# Patient Record
Sex: Female | Born: 1945
Health system: Southern US, Community
[De-identification: ages and names within clinical notes are randomized; demographics above are authoritative.]

## PROBLEM LIST (undated history)

## (undated) DIAGNOSIS — K219 Gastro-esophageal reflux disease without esophagitis: Secondary | ICD-10-CM

## (undated) DIAGNOSIS — Z8673 Personal history of transient ischemic attack (TIA), and cerebral infarction without residual deficits: Secondary | ICD-10-CM

## (undated) DIAGNOSIS — M8580 Other specified disorders of bone density and structure, unspecified site: Secondary | ICD-10-CM

## (undated) DIAGNOSIS — E782 Mixed hyperlipidemia: Secondary | ICD-10-CM

## (undated) DIAGNOSIS — H8109 Meniere's disease, unspecified ear: Secondary | ICD-10-CM

## (undated) DIAGNOSIS — D509 Iron deficiency anemia, unspecified: Secondary | ICD-10-CM

## (undated) DIAGNOSIS — E039 Hypothyroidism, unspecified: Secondary | ICD-10-CM

## (undated) DIAGNOSIS — J302 Other seasonal allergic rhinitis: Secondary | ICD-10-CM

## (undated) HISTORY — DX: Other seasonal allergic rhinitis: J30.2

## (undated) HISTORY — DX: Meniere's disease, unspecified ear: H81.09

## (undated) HISTORY — PX: OTHER SURGICAL HISTORY: SHX169

## (undated) HISTORY — DX: Hypothyroidism, unspecified: E03.9

## (undated) HISTORY — DX: Other specified disorders of bone density and structure, unspecified site: M85.80

## (undated) HISTORY — DX: Mixed hyperlipidemia: E78.2

## (undated) HISTORY — DX: Personal history of transient ischemic attack (TIA), and cerebral infarction without residual deficits: Z86.73

## (undated) HISTORY — PX: TONSILLECTOMY: SUR1361

## (undated) HISTORY — DX: Iron deficiency anemia, unspecified: D50.9

## (undated) HISTORY — DX: Gastro-esophageal reflux disease without esophagitis: K21.9

---

## 2007-10-29 ENCOUNTER — Ambulatory Visit: Payer: Self-pay | Admitting: Gastroenterology

## 2010-05-30 DIAGNOSIS — H35419 Lattice degeneration of retina, unspecified eye: Secondary | ICD-10-CM | POA: Insufficient documentation

## 2010-05-30 DIAGNOSIS — H33319 Horseshoe tear of retina without detachment, unspecified eye: Secondary | ICD-10-CM | POA: Insufficient documentation

## 2012-04-07 ENCOUNTER — Ambulatory Visit: Payer: Self-pay | Admitting: Family Medicine

## 2012-05-07 ENCOUNTER — Ambulatory Visit: Payer: Self-pay | Admitting: Family Medicine

## 2013-01-26 ENCOUNTER — Ambulatory Visit: Payer: Self-pay | Admitting: Family Medicine

## 2013-02-10 ENCOUNTER — Other Ambulatory Visit: Payer: Self-pay

## 2013-02-24 ENCOUNTER — Ambulatory Visit (INDEPENDENT_AMBULATORY_CARE_PROVIDER_SITE_OTHER): Payer: Medicare Other | Admitting: *Deleted

## 2013-02-24 DIAGNOSIS — G459 Transient cerebral ischemic attack, unspecified: Secondary | ICD-10-CM

## 2013-02-24 DIAGNOSIS — I359 Nonrheumatic aortic valve disorder, unspecified: Secondary | ICD-10-CM

## 2013-03-10 ENCOUNTER — Ambulatory Visit: Payer: Self-pay | Admitting: Family Medicine

## 2013-07-09 DIAGNOSIS — M7520 Bicipital tendinitis, unspecified shoulder: Secondary | ICD-10-CM | POA: Diagnosis not present

## 2013-07-09 DIAGNOSIS — E039 Hypothyroidism, unspecified: Secondary | ICD-10-CM | POA: Diagnosis not present

## 2013-07-09 DIAGNOSIS — M79609 Pain in unspecified limb: Secondary | ICD-10-CM | POA: Diagnosis not present

## 2013-07-20 DIAGNOSIS — H269 Unspecified cataract: Secondary | ICD-10-CM | POA: Insufficient documentation

## 2013-07-20 DIAGNOSIS — H547 Unspecified visual loss: Secondary | ICD-10-CM | POA: Diagnosis not present

## 2013-07-27 DIAGNOSIS — M25519 Pain in unspecified shoulder: Secondary | ICD-10-CM | POA: Diagnosis not present

## 2013-07-27 DIAGNOSIS — E039 Hypothyroidism, unspecified: Secondary | ICD-10-CM | POA: Diagnosis not present

## 2013-07-28 DIAGNOSIS — H8109 Meniere's disease, unspecified ear: Secondary | ICD-10-CM | POA: Diagnosis not present

## 2013-07-28 DIAGNOSIS — H905 Unspecified sensorineural hearing loss: Secondary | ICD-10-CM | POA: Diagnosis not present

## 2013-07-28 DIAGNOSIS — H6120 Impacted cerumen, unspecified ear: Secondary | ICD-10-CM | POA: Diagnosis not present

## 2013-07-28 DIAGNOSIS — H903 Sensorineural hearing loss, bilateral: Secondary | ICD-10-CM | POA: Diagnosis not present

## 2013-07-31 DIAGNOSIS — M25519 Pain in unspecified shoulder: Secondary | ICD-10-CM | POA: Diagnosis not present

## 2013-08-03 DIAGNOSIS — M25519 Pain in unspecified shoulder: Secondary | ICD-10-CM | POA: Diagnosis not present

## 2013-08-05 DIAGNOSIS — M25519 Pain in unspecified shoulder: Secondary | ICD-10-CM | POA: Diagnosis not present

## 2013-08-17 DIAGNOSIS — I6789 Other cerebrovascular disease: Secondary | ICD-10-CM | POA: Diagnosis not present

## 2013-09-04 DIAGNOSIS — M25519 Pain in unspecified shoulder: Secondary | ICD-10-CM | POA: Diagnosis not present

## 2013-09-04 DIAGNOSIS — J309 Allergic rhinitis, unspecified: Secondary | ICD-10-CM | POA: Diagnosis not present

## 2013-09-04 DIAGNOSIS — E039 Hypothyroidism, unspecified: Secondary | ICD-10-CM | POA: Diagnosis not present

## 2013-09-04 DIAGNOSIS — G459 Transient cerebral ischemic attack, unspecified: Secondary | ICD-10-CM | POA: Diagnosis not present

## 2013-09-04 DIAGNOSIS — H8109 Meniere's disease, unspecified ear: Secondary | ICD-10-CM | POA: Diagnosis not present

## 2013-09-07 DIAGNOSIS — M25519 Pain in unspecified shoulder: Secondary | ICD-10-CM | POA: Diagnosis not present

## 2013-09-11 DIAGNOSIS — M25519 Pain in unspecified shoulder: Secondary | ICD-10-CM | POA: Diagnosis not present

## 2013-09-15 DIAGNOSIS — M25519 Pain in unspecified shoulder: Secondary | ICD-10-CM | POA: Diagnosis not present

## 2013-09-21 DIAGNOSIS — M25519 Pain in unspecified shoulder: Secondary | ICD-10-CM | POA: Diagnosis not present

## 2013-09-25 DIAGNOSIS — M25519 Pain in unspecified shoulder: Secondary | ICD-10-CM | POA: Diagnosis not present

## 2014-01-04 DIAGNOSIS — H8109 Meniere's disease, unspecified ear: Secondary | ICD-10-CM | POA: Diagnosis not present

## 2014-01-04 DIAGNOSIS — H9319 Tinnitus, unspecified ear: Secondary | ICD-10-CM | POA: Diagnosis not present

## 2014-01-04 DIAGNOSIS — H903 Sensorineural hearing loss, bilateral: Secondary | ICD-10-CM | POA: Diagnosis not present

## 2014-01-04 DIAGNOSIS — H9120 Sudden idiopathic hearing loss, unspecified ear: Secondary | ICD-10-CM | POA: Diagnosis not present

## 2014-01-04 DIAGNOSIS — H905 Unspecified sensorineural hearing loss: Secondary | ICD-10-CM | POA: Diagnosis not present

## 2014-01-05 DIAGNOSIS — H538 Other visual disturbances: Secondary | ICD-10-CM | POA: Diagnosis not present

## 2014-01-14 DIAGNOSIS — H8109 Meniere's disease, unspecified ear: Secondary | ICD-10-CM | POA: Diagnosis not present

## 2014-01-14 DIAGNOSIS — H903 Sensorineural hearing loss, bilateral: Secondary | ICD-10-CM | POA: Diagnosis not present

## 2014-02-02 DIAGNOSIS — H905 Unspecified sensorineural hearing loss: Secondary | ICD-10-CM | POA: Diagnosis not present

## 2014-02-02 DIAGNOSIS — H903 Sensorineural hearing loss, bilateral: Secondary | ICD-10-CM | POA: Diagnosis not present

## 2014-02-02 DIAGNOSIS — H8109 Meniere's disease, unspecified ear: Secondary | ICD-10-CM | POA: Diagnosis not present

## 2014-02-08 DIAGNOSIS — H8109 Meniere's disease, unspecified ear: Secondary | ICD-10-CM | POA: Diagnosis not present

## 2014-02-15 DIAGNOSIS — H8109 Meniere's disease, unspecified ear: Secondary | ICD-10-CM | POA: Diagnosis not present

## 2014-02-15 DIAGNOSIS — H6060 Unspecified chronic otitis externa, unspecified ear: Secondary | ICD-10-CM | POA: Diagnosis not present

## 2014-02-22 DIAGNOSIS — H8102 Meniere's disease, left ear: Secondary | ICD-10-CM | POA: Diagnosis not present

## 2014-02-22 DIAGNOSIS — H903 Sensorineural hearing loss, bilateral: Secondary | ICD-10-CM | POA: Diagnosis not present

## 2014-03-22 DIAGNOSIS — H8102 Meniere's disease, left ear: Secondary | ICD-10-CM | POA: Diagnosis not present

## 2014-03-22 DIAGNOSIS — H903 Sensorineural hearing loss, bilateral: Secondary | ICD-10-CM | POA: Diagnosis not present

## 2014-03-29 DIAGNOSIS — H9121 Sudden idiopathic hearing loss, right ear: Secondary | ICD-10-CM | POA: Diagnosis not present

## 2014-04-26 DIAGNOSIS — H903 Sensorineural hearing loss, bilateral: Secondary | ICD-10-CM | POA: Diagnosis not present

## 2014-04-26 DIAGNOSIS — H8102 Meniere's disease, left ear: Secondary | ICD-10-CM | POA: Diagnosis not present

## 2014-04-26 DIAGNOSIS — R42 Dizziness and giddiness: Secondary | ICD-10-CM | POA: Diagnosis not present

## 2014-04-26 DIAGNOSIS — H9313 Tinnitus, bilateral: Secondary | ICD-10-CM | POA: Diagnosis not present

## 2014-05-18 DIAGNOSIS — Z9181 History of falling: Secondary | ICD-10-CM | POA: Diagnosis not present

## 2014-05-18 DIAGNOSIS — D473 Essential (hemorrhagic) thrombocythemia: Secondary | ICD-10-CM | POA: Diagnosis not present

## 2014-05-18 DIAGNOSIS — J302 Other seasonal allergic rhinitis: Secondary | ICD-10-CM | POA: Diagnosis not present

## 2014-05-18 DIAGNOSIS — M8580 Other specified disorders of bone density and structure, unspecified site: Secondary | ICD-10-CM | POA: Diagnosis not present

## 2014-05-18 DIAGNOSIS — L821 Other seborrheic keratosis: Secondary | ICD-10-CM | POA: Diagnosis not present

## 2014-05-18 DIAGNOSIS — E039 Hypothyroidism, unspecified: Secondary | ICD-10-CM | POA: Diagnosis not present

## 2014-05-18 DIAGNOSIS — Z1389 Encounter for screening for other disorder: Secondary | ICD-10-CM | POA: Diagnosis not present

## 2014-05-18 DIAGNOSIS — Z8673 Personal history of transient ischemic attack (TIA), and cerebral infarction without residual deficits: Secondary | ICD-10-CM | POA: Diagnosis not present

## 2014-05-26 DIAGNOSIS — Z8673 Personal history of transient ischemic attack (TIA), and cerebral infarction without residual deficits: Secondary | ICD-10-CM | POA: Diagnosis not present

## 2014-05-26 DIAGNOSIS — E039 Hypothyroidism, unspecified: Secondary | ICD-10-CM | POA: Diagnosis not present

## 2014-05-31 DIAGNOSIS — R42 Dizziness and giddiness: Secondary | ICD-10-CM | POA: Diagnosis not present

## 2014-05-31 DIAGNOSIS — H8102 Meniere's disease, left ear: Secondary | ICD-10-CM | POA: Diagnosis not present

## 2014-05-31 DIAGNOSIS — H905 Unspecified sensorineural hearing loss: Secondary | ICD-10-CM | POA: Diagnosis not present

## 2014-05-31 DIAGNOSIS — H9313 Tinnitus, bilateral: Secondary | ICD-10-CM | POA: Diagnosis not present

## 2014-06-10 ENCOUNTER — Ambulatory Visit: Payer: Self-pay | Admitting: Internal Medicine

## 2014-06-10 DIAGNOSIS — K219 Gastro-esophageal reflux disease without esophagitis: Secondary | ICD-10-CM | POA: Diagnosis not present

## 2014-06-10 DIAGNOSIS — E611 Iron deficiency: Secondary | ICD-10-CM | POA: Diagnosis not present

## 2014-06-10 DIAGNOSIS — D473 Essential (hemorrhagic) thrombocythemia: Secondary | ICD-10-CM | POA: Diagnosis not present

## 2014-06-10 DIAGNOSIS — D72829 Elevated white blood cell count, unspecified: Secondary | ICD-10-CM | POA: Diagnosis not present

## 2014-06-10 DIAGNOSIS — Z8673 Personal history of transient ischemic attack (TIA), and cerebral infarction without residual deficits: Secondary | ICD-10-CM | POA: Diagnosis not present

## 2014-06-10 DIAGNOSIS — M8580 Other specified disorders of bone density and structure, unspecified site: Secondary | ICD-10-CM | POA: Diagnosis not present

## 2014-06-10 DIAGNOSIS — E039 Hypothyroidism, unspecified: Secondary | ICD-10-CM | POA: Diagnosis not present

## 2014-06-10 DIAGNOSIS — Z79899 Other long term (current) drug therapy: Secondary | ICD-10-CM | POA: Diagnosis not present

## 2014-06-10 LAB — CBC CANCER CENTER
Comment - H1-Com1: NORMAL
Comment - H1-Com2: NORMAL
HCT: 42.7 % (ref 35.0–47.0)
HGB: 14.4 g/dL (ref 12.0–16.0)
Lymphocytes: 6 %
MCH: 28.5 pg (ref 26.0–34.0)
MCHC: 33.8 g/dL (ref 32.0–36.0)
MCV: 84 fL (ref 80–100)
Monocytes: 1 %
Platelet: 395 x10 3/mm (ref 150–440)
RBC: 5.07 10*6/uL (ref 3.80–5.20)
RDW: 13.2 % (ref 11.5–14.5)
SEGMENTED NEUTROPHILS: 92 %
VARIANT LYMPHOCYTE - H4-RLYMPH: 1 %
WBC: 11 x10 3/mm (ref 3.6–11.0)

## 2014-06-10 LAB — SEDIMENTATION RATE: Erythrocyte Sed Rate: 6 mm/hr (ref 0–30)

## 2014-06-10 LAB — FERRITIN: FERRITIN (ARMC): 37 ng/mL (ref 8–388)

## 2014-06-11 LAB — IRON AND TIBC
IRON SATURATION: 10 %
Iron Bind.Cap.(Total): 388 ug/dL (ref 250–450)
Iron: 37 ug/dL — ABNORMAL LOW (ref 50–170)
UNBOUND IRON-BIND. CAP.: 351 ug/dL

## 2014-06-23 ENCOUNTER — Ambulatory Visit: Payer: Self-pay | Admitting: Family Medicine

## 2014-06-23 DIAGNOSIS — Z1231 Encounter for screening mammogram for malignant neoplasm of breast: Secondary | ICD-10-CM | POA: Diagnosis not present

## 2014-06-24 DIAGNOSIS — K219 Gastro-esophageal reflux disease without esophagitis: Secondary | ICD-10-CM | POA: Diagnosis not present

## 2014-06-24 DIAGNOSIS — D473 Essential (hemorrhagic) thrombocythemia: Secondary | ICD-10-CM | POA: Diagnosis not present

## 2014-06-24 DIAGNOSIS — D72829 Elevated white blood cell count, unspecified: Secondary | ICD-10-CM | POA: Diagnosis not present

## 2014-06-24 DIAGNOSIS — E611 Iron deficiency: Secondary | ICD-10-CM | POA: Diagnosis not present

## 2014-06-24 DIAGNOSIS — E039 Hypothyroidism, unspecified: Secondary | ICD-10-CM | POA: Diagnosis not present

## 2014-06-24 DIAGNOSIS — Z79899 Other long term (current) drug therapy: Secondary | ICD-10-CM | POA: Diagnosis not present

## 2014-06-28 DIAGNOSIS — H8102 Meniere's disease, left ear: Secondary | ICD-10-CM | POA: Diagnosis not present

## 2014-06-28 DIAGNOSIS — H903 Sensorineural hearing loss, bilateral: Secondary | ICD-10-CM | POA: Diagnosis not present

## 2014-06-28 DIAGNOSIS — H9312 Tinnitus, left ear: Secondary | ICD-10-CM | POA: Diagnosis not present

## 2014-06-28 DIAGNOSIS — H6123 Impacted cerumen, bilateral: Secondary | ICD-10-CM | POA: Diagnosis not present

## 2014-07-01 DIAGNOSIS — D509 Iron deficiency anemia, unspecified: Secondary | ICD-10-CM | POA: Diagnosis not present

## 2014-07-06 ENCOUNTER — Ambulatory Visit
Admit: 2014-07-06 | Disposition: A | Discharge status: Home / Self Care | Payer: Self-pay | Attending: Internal Medicine | Admitting: Internal Medicine

## 2014-07-09 ENCOUNTER — Ambulatory Visit: Payer: Self-pay | Admitting: Gastroenterology

## 2014-07-09 DIAGNOSIS — Z881 Allergy status to other antibiotic agents status: Secondary | ICD-10-CM | POA: Diagnosis not present

## 2014-07-09 DIAGNOSIS — Z7982 Long term (current) use of aspirin: Secondary | ICD-10-CM | POA: Diagnosis not present

## 2014-07-09 DIAGNOSIS — K648 Other hemorrhoids: Secondary | ICD-10-CM | POA: Diagnosis not present

## 2014-07-09 DIAGNOSIS — I10 Essential (primary) hypertension: Secondary | ICD-10-CM | POA: Diagnosis not present

## 2014-07-09 DIAGNOSIS — K219 Gastro-esophageal reflux disease without esophagitis: Secondary | ICD-10-CM | POA: Diagnosis not present

## 2014-07-09 DIAGNOSIS — M1610 Unilateral primary osteoarthritis, unspecified hip: Secondary | ICD-10-CM | POA: Diagnosis not present

## 2014-07-09 DIAGNOSIS — K209 Esophagitis, unspecified: Secondary | ICD-10-CM | POA: Diagnosis not present

## 2014-07-09 DIAGNOSIS — M19042 Primary osteoarthritis, left hand: Secondary | ICD-10-CM | POA: Diagnosis not present

## 2014-07-09 DIAGNOSIS — K2970 Gastritis, unspecified, without bleeding: Secondary | ICD-10-CM | POA: Diagnosis not present

## 2014-07-09 DIAGNOSIS — K2950 Unspecified chronic gastritis without bleeding: Secondary | ICD-10-CM | POA: Diagnosis not present

## 2014-07-09 DIAGNOSIS — M19041 Primary osteoarthritis, right hand: Secondary | ICD-10-CM | POA: Diagnosis not present

## 2014-07-09 DIAGNOSIS — Z8673 Personal history of transient ischemic attack (TIA), and cerebral infarction without residual deficits: Secondary | ICD-10-CM | POA: Diagnosis not present

## 2014-07-09 DIAGNOSIS — K5730 Diverticulosis of large intestine without perforation or abscess without bleeding: Secondary | ICD-10-CM | POA: Diagnosis not present

## 2014-07-09 DIAGNOSIS — D123 Benign neoplasm of transverse colon: Secondary | ICD-10-CM | POA: Diagnosis not present

## 2014-07-09 DIAGNOSIS — D509 Iron deficiency anemia, unspecified: Secondary | ICD-10-CM | POA: Diagnosis not present

## 2014-07-14 DIAGNOSIS — L821 Other seborrheic keratosis: Secondary | ICD-10-CM | POA: Diagnosis not present

## 2014-07-23 DIAGNOSIS — R0789 Other chest pain: Secondary | ICD-10-CM | POA: Diagnosis not present

## 2014-07-23 DIAGNOSIS — D509 Iron deficiency anemia, unspecified: Secondary | ICD-10-CM | POA: Diagnosis not present

## 2014-07-23 DIAGNOSIS — Z8673 Personal history of transient ischemic attack (TIA), and cerebral infarction without residual deficits: Secondary | ICD-10-CM | POA: Diagnosis not present

## 2014-07-23 DIAGNOSIS — J302 Other seasonal allergic rhinitis: Secondary | ICD-10-CM | POA: Diagnosis not present

## 2014-07-23 DIAGNOSIS — E782 Mixed hyperlipidemia: Secondary | ICD-10-CM | POA: Diagnosis not present

## 2014-07-23 DIAGNOSIS — Z1389 Encounter for screening for other disorder: Secondary | ICD-10-CM | POA: Diagnosis not present

## 2014-07-23 DIAGNOSIS — R7309 Other abnormal glucose: Secondary | ICD-10-CM | POA: Diagnosis not present

## 2014-07-30 ENCOUNTER — Encounter: Payer: Self-pay | Admitting: Cardiovascular Disease

## 2014-07-30 ENCOUNTER — Ambulatory Visit (INDEPENDENT_AMBULATORY_CARE_PROVIDER_SITE_OTHER): Payer: Medicare Other | Admitting: Cardiovascular Disease

## 2014-07-30 ENCOUNTER — Encounter (INDEPENDENT_AMBULATORY_CARE_PROVIDER_SITE_OTHER): Payer: Self-pay

## 2014-07-30 VITALS — BP 142/88 | HR 91 | Ht 66.0 in | Wt 142.0 lb

## 2014-07-30 DIAGNOSIS — R7989 Other specified abnormal findings of blood chemistry: Secondary | ICD-10-CM | POA: Insufficient documentation

## 2014-07-30 DIAGNOSIS — M8580 Other specified disorders of bone density and structure, unspecified site: Secondary | ICD-10-CM | POA: Insufficient documentation

## 2014-07-30 DIAGNOSIS — H8109 Meniere's disease, unspecified ear: Secondary | ICD-10-CM | POA: Insufficient documentation

## 2014-07-30 DIAGNOSIS — Z8673 Personal history of transient ischemic attack (TIA), and cerebral infarction without residual deficits: Secondary | ICD-10-CM | POA: Diagnosis not present

## 2014-07-30 DIAGNOSIS — E782 Mixed hyperlipidemia: Secondary | ICD-10-CM | POA: Diagnosis not present

## 2014-07-30 DIAGNOSIS — R7309 Other abnormal glucose: Secondary | ICD-10-CM | POA: Insufficient documentation

## 2014-07-30 DIAGNOSIS — I6523 Occlusion and stenosis of bilateral carotid arteries: Secondary | ICD-10-CM | POA: Diagnosis not present

## 2014-07-30 DIAGNOSIS — G4700 Insomnia, unspecified: Secondary | ICD-10-CM | POA: Insufficient documentation

## 2014-07-30 DIAGNOSIS — T148XXA Other injury of unspecified body region, initial encounter: Secondary | ICD-10-CM | POA: Insufficient documentation

## 2014-07-30 DIAGNOSIS — M7520 Bicipital tendinitis, unspecified shoulder: Secondary | ICD-10-CM | POA: Insufficient documentation

## 2014-07-30 DIAGNOSIS — D509 Iron deficiency anemia, unspecified: Secondary | ICD-10-CM | POA: Insufficient documentation

## 2014-07-30 DIAGNOSIS — L821 Other seborrheic keratosis: Secondary | ICD-10-CM | POA: Insufficient documentation

## 2014-07-30 DIAGNOSIS — R0789 Other chest pain: Secondary | ICD-10-CM | POA: Diagnosis not present

## 2014-07-30 DIAGNOSIS — I6529 Occlusion and stenosis of unspecified carotid artery: Secondary | ICD-10-CM | POA: Insufficient documentation

## 2014-07-30 DIAGNOSIS — J302 Other seasonal allergic rhinitis: Secondary | ICD-10-CM | POA: Insufficient documentation

## 2014-07-30 DIAGNOSIS — R350 Frequency of micturition: Secondary | ICD-10-CM | POA: Insufficient documentation

## 2014-07-30 DIAGNOSIS — B353 Tinea pedis: Secondary | ICD-10-CM | POA: Insufficient documentation

## 2014-07-30 DIAGNOSIS — M7070 Other bursitis of hip, unspecified hip: Secondary | ICD-10-CM | POA: Insufficient documentation

## 2014-07-30 DIAGNOSIS — E039 Hypothyroidism, unspecified: Secondary | ICD-10-CM | POA: Insufficient documentation

## 2014-07-30 NOTE — Assessment & Plan Note (Signed)
In 2014, possible TIA with foot drop. No workup done at that time. If she has recurrent symptoms, we have recommended she go to the emergency room for CT scan/MRI No symptoms concerning for arrhythmia such as atrial fibrillation

## 2014-07-30 NOTE — Progress Notes (Signed)
Patient ID: Michelle Fuentes, female    DOB: October 09, 1945, 69 y.o.   MRN: [id number]  HPI Comments: Michelle Fuentes is a 69 year old woman with history of hypertension, Mnire's disease, hyperlipidemia, mild carotid arterial disease on ultrasound in 2014 who presents for evaluation of chest pain.  She reports that approximately one month ago she was driving when she developed chest tightness bilaterally underneath her breasts radiating around her flank. She had just had a ice cream. Symptoms lasted proximally 5 minutes. She also reported having a very small region of discomfort/numbness up in her right jaw/mandible area. She mentioned this to her dental hygienist to recommended EKG. She has this done through primary care. This was essentially normal. She's had no further episodes since that time.  She was found to have low iron on workup with hematology. EGD and colonoscopy showed gastritis, diverticuli, no active bleeding. She is only recently started taking iron supplement.  She denies any other new symptoms. Previously was very active, has not been as active as she has had numerous Dr. Visits.  Carotid ultrasound 01/26/2013 reviewed with her showing atherosclerotic plaque bilaterally, no significant stenosis  She reports total cholesterol 235 with high HDL No prior smoking history or diabetes  She does report episode in 2014 where she had general malaise, went to sleep that night, early in the morning could not get out of bed to go to the bathroom, noticed that she had equally walking, possible foot drop. She got better later on in the day  EKG on today's visit shows normal sinus rhythm with rate 91 bpm, no significant ST or T-wave changes   Allergies  Allergen Reactions  . Erythromycin Base     Other reaction(s): Other (See Comments) Made her tongue sore    Outpatient Encounter Prescriptions as of 07/30/2014  Medication Sig  . aspirin 81 MG tablet Take 81 mg by mouth daily.   .  Cholecalciferol 1000 UNITS capsule Take 1,000 Units by mouth daily.   . diazepam (VALIUM) 2 MG tablet Take 2 mg by mouth every 6 (six) hours as needed.   . fluticasone (FLONASE) 50 MCG/ACT nasal spray Place 2 sprays into the nose daily.   Marland Kitchen levothyroxine (SYNTHROID, LEVOTHROID) 88 MCG tablet Take 88 mcg by mouth daily before breakfast.   . Lutein 6 MG CAPS Take by mouth daily.   . Magnesium Gluconate 250 MG TABS Take 250 mg by mouth daily.   . meclizine (ANTIVERT) 25 MG tablet Take 25 mg by mouth once as needed.   . meloxicam (MOBIC) 7.5 MG tablet Take 7.5 mg by mouth daily.   . prednisoLONE (ORAPRED ODT) 10 MG disintegrating tablet Take 10 mg by mouth every 3 (three) days.   Marland Kitchen triamterene-hydrochlorothiazide (DYAZIDE) 37.5-25 MG per capsule Take 1 capsule by mouth daily.   . [DISCONTINUED] mometasone (NASONEX) 50 MCG/ACT nasal spray Place 2 sprays into the nose daily as needed.     Past Medical History  Diagnosis Date  . History of TIA (transient ischemic attack)   . Seasonal allergies   . Mixed hyperlipidemia   . Iron deficiency anemia   . Meniere disease   . Hypothyroidism   . Osteopenia   . GERD (gastroesophageal reflux disease)     Past Surgical History  Procedure Laterality Date  . Tonsillectomy      Social History  reports that she has never smoked. She does not have any smokeless tobacco history on file. She reports that she does not use illicit  drugs.  Family History family history is not on file.  Review of Systems  Constitutional: Negative.   Respiratory: Positive for chest tightness.   Cardiovascular: Negative.   Gastrointestinal: Negative.   Musculoskeletal: Negative.   Skin: Negative.   Neurological: Negative.   Hematological: Negative.   Psychiatric/Behavioral: Negative.   All other systems reviewed and are negative.   BP 142/88 mmHg  Pulse 91  Ht 5\' 6"  (1.676 m)  Wt 142 lb (64.411 kg)  BMI 22.93 kg/m2  Physical Exam  Constitutional: She is  oriented to person, place, and time. She appears well-developed and well-nourished.  HENT:  Head: Normocephalic.  Nose: Nose normal.  Mouth/Throat: Oropharynx is clear and moist.  Eyes: Conjunctivae are normal. Pupils are equal, round, and reactive to light.  Neck: Normal range of motion. Neck supple. No JVD present.  Cardiovascular: Normal rate, regular rhythm, S1 normal, S2 normal, normal heart sounds and intact distal pulses.  Exam reveals no gallop and no friction rub.   No murmur heard. Nonpitting edema noted around the ankles  Pulmonary/Chest: Effort normal and breath sounds normal. No respiratory distress. She has no wheezes. She has no rales. She exhibits no tenderness.  Abdominal: Soft. Bowel sounds are normal. She exhibits no distension. There is no tenderness.  Musculoskeletal: Normal range of motion. She exhibits edema. She exhibits no tenderness.  Lymphadenopathy:    She has no cervical adenopathy.  Neurological: She is alert and oriented to person, place, and time. Coordination normal.  Skin: Skin is warm and dry. No rash noted. No erythema.  Psychiatric: She has a normal mood and affect. Her behavior is normal. Judgment and thought content normal.    Assessment and Plan  Nursing note and vitals reviewed.

## 2014-07-30 NOTE — Assessment & Plan Note (Signed)
She does not want a statin. Recommended strict diet, weight loss. She reports total cholesterol 235

## 2014-07-30 NOTE — Patient Instructions (Signed)
You are doing well. No medication changes were made.  Please call if you have more chest pain symptoms We could do a stress test (treadmill) or calcium score  Think about a CT coronary calcium score, in [place]  Please call us if you have new issues that need to be addressed before your next appt.

## 2014-07-30 NOTE — Assessment & Plan Note (Signed)
Ultrasounded 2014 showing bilateral carotid atherosclerosis. Likely has underlying coronary disease as well. Discussed cholesterol and various options. She does not want a statin. No carotid bruits on today's visit. Again recommended treadmill or coronary calcium score for any further chest pain. No need at this time for repeat carotid ultrasound

## 2014-07-30 NOTE — Assessment & Plan Note (Signed)
One episode of atypical chest pain one month ago. Normal EKG, normal echocardiogram one year ago. Symptoms presented at rest after eating ice cream. Unable to exclude gallbladder disease. She has been active since that time though has not returned to her full workout schedule. We spent a long time on her visit today talking about various options for evaluation. She does not particularly want a testing involving IVs including Myoview. Options include treadmill or a CT coronary calcium score. She will call us back if she has recurrent symptoms. Recommended she restart her exercise program.

## 2014-08-18 DIAGNOSIS — L570 Actinic keratosis: Secondary | ICD-10-CM | POA: Diagnosis not present

## 2014-08-19 DIAGNOSIS — R0789 Other chest pain: Secondary | ICD-10-CM | POA: Diagnosis not present

## 2014-08-19 DIAGNOSIS — R7309 Other abnormal glucose: Secondary | ICD-10-CM | POA: Diagnosis not present

## 2014-08-23 DIAGNOSIS — H905 Unspecified sensorineural hearing loss: Secondary | ICD-10-CM | POA: Diagnosis not present

## 2014-08-23 DIAGNOSIS — H9312 Tinnitus, left ear: Secondary | ICD-10-CM | POA: Diagnosis not present

## 2014-08-23 DIAGNOSIS — H8103 Meniere's disease, bilateral: Secondary | ICD-10-CM | POA: Diagnosis not present

## 2014-09-16 DIAGNOSIS — E782 Mixed hyperlipidemia: Secondary | ICD-10-CM | POA: Diagnosis not present

## 2014-09-16 DIAGNOSIS — J0101 Acute recurrent maxillary sinusitis: Secondary | ICD-10-CM | POA: Diagnosis not present

## 2014-09-16 DIAGNOSIS — J302 Other seasonal allergic rhinitis: Secondary | ICD-10-CM | POA: Diagnosis not present

## 2014-09-16 DIAGNOSIS — H9311 Tinnitus, right ear: Secondary | ICD-10-CM | POA: Diagnosis not present

## 2014-09-28 DIAGNOSIS — L570 Actinic keratosis: Secondary | ICD-10-CM | POA: Diagnosis not present

## 2014-11-02 DIAGNOSIS — L821 Other seborrheic keratosis: Secondary | ICD-10-CM | POA: Diagnosis not present

## 2014-11-02 DIAGNOSIS — L570 Actinic keratosis: Secondary | ICD-10-CM | POA: Diagnosis not present

## 2014-11-02 DIAGNOSIS — D234 Other benign neoplasm of skin of scalp and neck: Secondary | ICD-10-CM | POA: Diagnosis not present

## 2015-02-08 DIAGNOSIS — L570 Actinic keratosis: Secondary | ICD-10-CM | POA: Diagnosis not present

## 2015-02-08 DIAGNOSIS — L821 Other seborrheic keratosis: Secondary | ICD-10-CM | POA: Diagnosis not present

## 2015-02-08 DIAGNOSIS — Z1283 Encounter for screening for malignant neoplasm of skin: Secondary | ICD-10-CM | POA: Diagnosis not present

## 2015-03-14 DIAGNOSIS — H01021 Squamous blepharitis right upper eyelid: Secondary | ICD-10-CM | POA: Diagnosis not present

## 2015-03-28 DIAGNOSIS — H6123 Impacted cerumen, bilateral: Secondary | ICD-10-CM | POA: Diagnosis not present

## 2015-03-28 DIAGNOSIS — H903 Sensorineural hearing loss, bilateral: Secondary | ICD-10-CM | POA: Diagnosis not present

## 2015-04-22 DIAGNOSIS — H43812 Vitreous degeneration, left eye: Secondary | ICD-10-CM | POA: Diagnosis not present

## 2015-04-28 ENCOUNTER — Ambulatory Visit: Payer: Self-pay | Admitting: Family Medicine

## 2015-04-28 ENCOUNTER — Ambulatory Visit (INDEPENDENT_AMBULATORY_CARE_PROVIDER_SITE_OTHER): Payer: Medicare Other | Admitting: Family Medicine

## 2015-04-28 VITALS — BP 122/82 | HR 80 | Temp 97.7°F | Resp 16 | Ht 66.0 in | Wt 147.0 lb

## 2015-04-28 DIAGNOSIS — J0101 Acute recurrent maxillary sinusitis: Secondary | ICD-10-CM | POA: Diagnosis not present

## 2015-04-28 DIAGNOSIS — D509 Iron deficiency anemia, unspecified: Secondary | ICD-10-CM | POA: Diagnosis not present

## 2015-04-28 DIAGNOSIS — E785 Hyperlipidemia, unspecified: Secondary | ICD-10-CM

## 2015-04-28 DIAGNOSIS — Z23 Encounter for immunization: Secondary | ICD-10-CM | POA: Diagnosis not present

## 2015-04-28 DIAGNOSIS — R202 Paresthesia of skin: Secondary | ICD-10-CM | POA: Diagnosis not present

## 2015-04-28 DIAGNOSIS — Z862 Personal history of diseases of the blood and blood-forming organs and certain disorders involving the immune mechanism: Secondary | ICD-10-CM | POA: Insufficient documentation

## 2015-04-28 MED ORDER — DOXYCYCLINE HYCLATE 100 MG PO TABS: 100.0000 mg | ORAL_TABLET | Freq: Two times a day (BID) | ORAL | Status: DC

## 2015-04-28 MED ORDER — DM-GUAIFENESIN ER 30-600 MG PO TB12: 1.0000 | ORAL_TABLET | Freq: Two times a day (BID) | ORAL | Status: DC

## 2015-04-28 NOTE — Progress Notes (Signed)
Subjective:    Patient ID: Michelle Fuentes, female    DOB: 1945/12/03, 69 y.o.   MRN: [id number]  HPI: Michelle Fuentes is a 69 y.o. female presenting on 04/28/2015 for Facial Pain   HPI  Pt presents for facial pain and pressure. Pressure felt kneading on her forehead off and on on Tuesday. Has been more stressed recently with husbands illness thought it . Experiencing HA- mild- no severe. No visual changes. Facial pressure not is constant. Coughing a little. No sore throat.  Pt is also concerned because she was due to have follow-up on anemia in July per Dr. Ma Hillock.  However lab work was never done. She was mildly anemic prior to colonoscopy and taking oral iron. No cold intolerance, fatigue, weakness, or dizziness.   Pt also having occasional (once every 2 mos) facial tingling. Never occurs in the same spot. R cheek. Chin or nose.  Lasts a few seconds. No facial drop. No loss of sensation. No pain. Pt has not noticed a pattern to symptoms.   Past Medical History  Diagnosis Date  . History of TIA (transient ischemic attack)   . Seasonal allergies   . Mixed hyperlipidemia   . Iron deficiency anemia   . Meniere disease   . Hypothyroidism   . Osteopenia   . GERD (gastroesophageal reflux disease)     Current Outpatient Prescriptions on File Prior to Visit  Medication Sig  . aspirin 81 MG tablet Take 81 mg by mouth daily.   . Cholecalciferol 1000 UNITS capsule Take 1,000 Units by mouth daily.   . diazepam (VALIUM) 2 MG tablet Take 2 mg by mouth every 6 (six) hours as needed.   . fluticasone (FLONASE) 50 MCG/ACT nasal spray Place 2 sprays into the nose daily.   Marland Kitchen levothyroxine (SYNTHROID, LEVOTHROID) 88 MCG tablet Take 88 mcg by mouth daily before breakfast.   . Lutein 6 MG CAPS Take 10 mg by mouth daily.   . Magnesium Gluconate 250 MG TABS Take 250 mg by mouth daily.   . meclizine (ANTIVERT) 25 MG tablet Take 25 mg by mouth once as needed.   . prednisoLONE (ORAPRED ODT) 10 MG  disintegrating tablet Take 10 mg by mouth. Every 5 days and if sx worst every 4 days  . triamterene-hydrochlorothiazide (DYAZIDE) 37.5-25 MG per capsule Take 1 capsule by mouth daily.    No current facility-administered medications on file prior to visit.    Review of Systems  Constitutional: Negative for fever and chills.  HENT: Positive for congestion and sinus pressure. Negative for facial swelling.   Gastrointestinal: Negative for nausea and vomiting.  Musculoskeletal: Negative for neck pain and neck stiffness.  Neurological: Positive for headaches. Negative for dizziness, tremors, weakness and light-headedness.  Psychiatric/Behavioral: Negative.    Per HPI unless specifically indicated above     Objective:    BP 122/82 mmHg  Pulse 80  Temp(Src) 97.7 F (36.5 C) (Oral)  Resp 16  Ht 5\' 6"  (1.676 m)  Wt 147 lb (66.679 kg)  BMI 23.74 kg/m2  SpO2 99%  Wt Readings from Last 3 Encounters:  04/28/15 147 lb (66.679 kg)  07/30/14 142 lb (64.411 kg)    Physical Exam  Constitutional: She is oriented to person, place, and time. She appears well-developed and well-nourished.  HENT:  Head: Normocephalic and atraumatic.  Right Ear: Hearing and tympanic membrane normal.  Left Ear: Hearing and tympanic membrane normal.  Nose: No mucosal edema or rhinorrhea. Right sinus exhibits maxillary  sinus tenderness. Right sinus exhibits no frontal sinus tenderness. Left sinus exhibits no maxillary sinus tenderness and no frontal sinus tenderness.  Mouth/Throat: Uvula is midline, oropharynx is clear and moist and mucous membranes are normal.  Neck: Neck supple.  Cardiovascular: Normal rate, regular rhythm and normal heart sounds.  Exam reveals no gallop and no friction rub.   No murmur heard. Pulmonary/Chest: Effort normal and breath sounds normal. She has no wheezes. She exhibits no tenderness.  Abdominal: Soft. Normal appearance and bowel sounds are normal. She exhibits no distension and no  mass. There is no tenderness. There is no rebound and no guarding.  Musculoskeletal: Normal range of motion. She exhibits no edema or tenderness.  Lymphadenopathy:    She has no cervical adenopathy.  Neurological: She is alert and oriented to person, place, and time. She has normal strength. No cranial nerve deficit or sensory deficit. She displays a negative Romberg sign.  Skin: Skin is warm and dry.   Results for orders placed or performed in visit on 06/10/14  [place]  Result Value Ref Range   WBC 11.0 3.6-11.0 x10 3/mm    RBC 5.07 3.80-5.20 x10 6/mm    HGB 14.4 12.0-16.0 g/dL   HCT 42.7 35.0-47.0 %   MCV 84 80-100 fL   MCH 28.5 26.0-34.0 pg   MCHC 33.8 32.0-36.0 g/dL   RDW 13.2 11.5-14.5 %   Platelet 395 150-440 x10 3/mm    Segmented Neutrophils 92 %   Lymphocytes 6 %   Variant Lymphocyte 1 %   Monocytes 1 %   Comment - H1-Com1 RBCs APPEAR NORMAL    Comment - H1-Com2 NORMAL PLT MORPHOLGY   Sedimentation rate  Result Value Ref Range   Erythrocyte Sed Rate 6 0-30 mm/hr  Ferritin  Result Value Ref Range   Ferritin ([facility]) 37 8-388 ng/mL  Iron and TIBC  Result Value Ref Range   Iron 37 (L) 50-170 mcg/dL   Iron Bind.Cap.(Total) 388 250-450 mcg/dL   Unbound Iron-Bind.Cap. 351 mcg/dL   Iron Saturation 10 %      Assessment & Plan:   Problem List Items Addressed This Visit      Other   Iron deficiency anemia    Recheck anemia panel to determine if iron is WNL. Consider iron therapy if not.       Relevant Orders   Anemia Profile B   Mild hyperlipidemia   Relevant Orders   Lipid Profile    Other Visit Diagnoses    Acute recurrent maxillary sinusitis    -  Primary    Treat for sinus infection given history and facial tenderness. If not improving consider work up for temporal artertis. Supporitve care at home. Alarm symptoms    Relevant Medications    doxycycline (VIBRA-TABS) 100 MG tablet    dextromethorphan-guaiFENesin (MUCINEX DM) 30-600 MG 12hr tablet      Paresthesias        Facial. Likely benign and infrequent. Check B12. No s/s of bells palsy. Reviewed alarm symptoms.     Relevant Orders    Comprehensive Metabolic Panel (CMET)    Anemia Profile B    Need for influenza vaccination        Relevant Orders    Flu vaccine HIGH DOSE PF (Fluzone High dose) (Completed)       Meds ordered this encounter  Medications  . ranitidine (ZANTAC) 150 MG tablet    Sig: Take 150 mg by mouth.  . Ginkgo Biloba Extract 60 MG  CAPS    Sig: Take 120 mg by mouth.  . doxycycline (VIBRA-TABS) 100 MG tablet    Sig: Take 1 tablet (100 mg total) by mouth 2 (two) times daily.    Dispense:  14 tablet    Refill:  0    Order Specific Question:  Supervising Provider    Answer:  Arlis Porta [phone]  . dextromethorphan-guaiFENesin (MUCINEX DM) 30-600 MG 12hr tablet    Sig: Take 1 tablet by mouth 2 (two) times daily.    Dispense:  20 tablet    Refill:  0    Order Specific Question:  Supervising Provider    Answer:  Arlis Porta [id number]      Follow up plan: Return if symptoms worsen or fail to improve.

## 2015-04-28 NOTE — Assessment & Plan Note (Signed)
Recheck anemia panel to determine if iron is WNL. Consider iron therapy if not.

## 2015-04-28 NOTE — Patient Instructions (Addendum)
You can use supportive care at home to help with your symptoms. I have sent Mucinex DM to your pharmacy to help break up the congestion and soothe your cough. You can takes this twice daily.  Honey is a natural cough suppressant- so add it to your tea in the morning.  If you have a humidifer, set that up in your bedroom at night. Use saline rinses to help with sinuses.   Take doxycycline twice daily for 7 days.   Please let me know if the facial pain doesn't subside.

## 2015-04-28 NOTE — Assessment & Plan Note (Signed)
Check lipid panel  

## 2015-05-26 DIAGNOSIS — R202 Paresthesia of skin: Secondary | ICD-10-CM | POA: Diagnosis not present

## 2015-05-26 DIAGNOSIS — D509 Iron deficiency anemia, unspecified: Secondary | ICD-10-CM | POA: Diagnosis not present

## 2015-05-26 DIAGNOSIS — E785 Hyperlipidemia, unspecified: Secondary | ICD-10-CM | POA: Diagnosis not present

## 2015-05-27 LAB — ANEMIA PROFILE B
BASOS ABS: 0 10*3/uL (ref 0.0–0.2)
Basos: 0 %
EOS (ABSOLUTE): 0.3 10*3/uL (ref 0.0–0.4)
Eos: 3 %
FERRITIN: 68 ng/mL (ref 15–150)
Hematocrit: 42.1 % (ref 34.0–46.6)
Hemoglobin: 14.3 g/dL (ref 11.1–15.9)
IMMATURE GRANS (ABS): 0 10*3/uL (ref 0.0–0.1)
Immature Granulocytes: 0 %
Iron Saturation: 37 % (ref 15–55)
Iron: 109 ug/dL (ref 27–139)
LYMPHS: 16 %
Lymphocytes Absolute: 1.4 10*3/uL (ref 0.7–3.1)
MCH: 29.1 pg (ref 26.6–33.0)
MCHC: 34 g/dL (ref 31.5–35.7)
MCV: 86 fL (ref 79–97)
MONOCYTES: 9 %
Monocytes Absolute: 0.8 10*3/uL (ref 0.1–0.9)
NEUTROS ABS: 6.4 10*3/uL (ref 1.4–7.0)
Neutrophils: 72 %
PLATELETS: 408 10*3/uL — AB (ref 150–379)
RBC: 4.92 x10E6/uL (ref 3.77–5.28)
RDW: 13.3 % (ref 12.3–15.4)
RETIC CT PCT: 0.9 % (ref 0.6–2.6)
Total Iron Binding Capacity: 296 ug/dL (ref 250–450)
UIBC: 187 ug/dL (ref 118–369)
VITAMIN B 12: 591 pg/mL (ref 211–946)
WBC: 9 10*3/uL (ref 3.4–10.8)

## 2015-05-27 LAB — COMPREHENSIVE METABOLIC PANEL
A/G RATIO: 2.1 (ref 1.1–2.5)
ALBUMIN: 4.4 g/dL (ref 3.6–4.8)
ALT: 14 IU/L (ref 0–32)
AST: 15 IU/L (ref 0–40)
Alkaline Phosphatase: 60 IU/L (ref 39–117)
BILIRUBIN TOTAL: 0.4 mg/dL (ref 0.0–1.2)
BUN / CREAT RATIO: 25 (ref 11–26)
BUN: 19 mg/dL (ref 8–27)
CHLORIDE: 98 mmol/L (ref 96–106)
CO2: 25 mmol/L (ref 18–29)
Calcium: 9.8 mg/dL (ref 8.7–10.3)
Creatinine, Ser: 0.76 mg/dL (ref 0.57–1.00)
GFR calc non Af Amer: 80 mL/min/{1.73_m2} (ref >59)
GFR, EST AFRICAN AMERICAN: 93 mL/min/{1.73_m2} (ref >59)
GLOBULIN, TOTAL: 2.1 g/dL (ref 1.5–4.5)
Glucose: 89 mg/dL (ref 65–99)
POTASSIUM: 4.5 mmol/L (ref 3.5–5.2)
SODIUM: 141 mmol/L (ref 134–144)
TOTAL PROTEIN: 6.5 g/dL (ref 6.0–8.5)

## 2015-05-27 LAB — LIPID PANEL
CHOL/HDL RATIO: 3 ratio (ref 0.0–4.4)
Cholesterol, Total: 224 mg/dL — ABNORMAL HIGH (ref 100–199)
HDL: 75 mg/dL (ref >39)
LDL Calculated: 129 mg/dL — ABNORMAL HIGH (ref 0–99)
TRIGLYCERIDES: 98 mg/dL (ref 0–149)
VLDL Cholesterol Cal: 20 mg/dL (ref 5–40)

## 2015-06-24 ENCOUNTER — Telehealth: Payer: Self-pay | Admitting: Family Medicine

## 2015-06-24 ENCOUNTER — Other Ambulatory Visit: Payer: Self-pay | Admitting: *Deleted

## 2015-06-24 MED ORDER — LEVOTHYROXINE SODIUM 88 MCG PO TABS: 88.0000 ug | ORAL_TABLET | Freq: Every day | ORAL | Status: DC

## 2015-06-24 NOTE — Telephone Encounter (Signed)
Pt needs a 90 day supply of levothyroxine sent to [facility] in [place].  She would also like to pick up a copy of recent lab results today and the number of office visit in 2016.  Her call back number is [phone]

## 2015-07-10 ENCOUNTER — Other Ambulatory Visit: Payer: Self-pay | Admitting: Family Medicine

## 2015-07-11 ENCOUNTER — Other Ambulatory Visit: Payer: Self-pay | Admitting: Family Medicine

## 2015-07-11 MED ORDER — FLUTICASONE PROPIONATE 50 MCG/ACT NA SUSP: 2.0000 | Freq: Every day | NASAL | Status: DC

## 2015-07-11 NOTE — Telephone Encounter (Signed)
Pt. Called requesting a refill on   Flonase (fluticasore) pt call back # is  [phone]

## 2015-08-18 ENCOUNTER — Encounter: Payer: Self-pay | Admitting: Family Medicine

## 2015-08-18 ENCOUNTER — Ambulatory Visit (INDEPENDENT_AMBULATORY_CARE_PROVIDER_SITE_OTHER): Payer: Medicare Other | Admitting: Family Medicine

## 2015-08-18 VITALS — BP 136/70 | HR 83 | Temp 98.3°F | Resp 16 | Ht 66.0 in | Wt 151.0 lb

## 2015-08-18 DIAGNOSIS — N841 Polyp of cervix uteri: Secondary | ICD-10-CM | POA: Diagnosis not present

## 2015-08-18 DIAGNOSIS — M6530 Trigger finger, unspecified finger: Secondary | ICD-10-CM

## 2015-08-18 DIAGNOSIS — N950 Postmenopausal bleeding: Secondary | ICD-10-CM

## 2015-08-18 NOTE — Progress Notes (Signed)
Subjective:    Patient ID: Michelle Fuentes, female    DOB: [birth year], 70 y.o.   MRN: [id number]  HPI: Michelle Fuentes is a 70 y.o. female presenting on 08/18/2015 for Hand Pain and Vaginal Bleeding   HPI   Bump on palm of hand: Noticed bump on palm of right hand below 4th finger about 2 months ago. Not painful. No skin color change. She has not noticed a change in size. She has not had anything like this before. No decreased ROM. No numbness or tingling. She is retired and does not do any repetitive movements with her right hand. She is right handed.  Vaginal Bleeding: has wiped and had spot of bright red blood occasionally. Felt left sided vibration in vaginal area two weeks ago. Spotting 2 weeks.  Had blood on pad after this day. Has not bled since then. Has history of what she was told is "skin tag" on cervical area, which she was told could start bleeding. Has not bleed before. No abdominal pain. No blood in urine. No pelvic pain.    Past Medical History  Diagnosis Date  . History of TIA (transient ischemic attack)   . Seasonal allergies   . Mixed hyperlipidemia   . Iron deficiency anemia   . Meniere disease   . Hypothyroidism   . Osteopenia   . GERD (gastroesophageal reflux disease)     Current Outpatient Prescriptions on File Prior to Visit  Medication Sig  . aspirin 81 MG tablet Take 81 mg by mouth daily.   . Cholecalciferol 1000 UNITS capsule Take 1,000 Units by mouth daily.   Marland Kitchen dextromethorphan-guaiFENesin (MUCINEX DM) 30-600 MG 12hr tablet Take 1 tablet by mouth 2 (two) times daily.  . diazepam (VALIUM) 2 MG tablet Take 2 mg by mouth every 6 (six) hours as needed.   . doxycycline (VIBRA-TABS) 100 MG tablet Take 1 tablet (100 mg total) by mouth 2 (two) times daily.  . fluticasone (FLONASE) 50 MCG/ACT nasal spray Place 2 sprays into both nostrils daily.  . Ginkgo Biloba Extract 60 MG CAPS Take 120 mg by mouth.  . levothyroxine (SYNTHROID, LEVOTHROID) 88 MCG tablet Take  1 tablet (88 mcg total) by mouth daily before breakfast.  . Lutein 6 MG CAPS Take 10 mg by mouth daily.   . Magnesium Gluconate 250 MG TABS Take 250 mg by mouth daily.   . meclizine (ANTIVERT) 25 MG tablet Take 25 mg by mouth once as needed.   . prednisoLONE (ORAPRED ODT) 10 MG disintegrating tablet Take 10 mg by mouth. Every 5 days and if sx worst every 4 days  . ranitidine (ZANTAC) 150 MG tablet Take 150 mg by mouth.  . triamterene-hydrochlorothiazide (DYAZIDE) 37.5-25 MG per capsule Take 1 capsule by mouth daily.    No current facility-administered medications on file prior to visit.    Review of Systems  Constitutional: Negative for fever, chills and activity change.  HENT: Negative.   Eyes: Negative for visual disturbance.  Respiratory: Negative for cough, chest tightness, shortness of breath and wheezing.   Cardiovascular: Negative for chest pain and leg swelling.  Gastrointestinal: Negative for nausea, vomiting, abdominal pain, diarrhea and constipation.  Endocrine: Negative.  Negative for cold intolerance, heat intolerance, polydipsia, polyphagia and polyuria.  Genitourinary: Positive for vaginal bleeding. Negative for dysuria, hematuria, difficulty urinating and pelvic pain.  Musculoskeletal: Negative.  Negative for myalgias and arthralgias.       Swelling below fourth finger on right hand that started 2  months ago. Non-painful.  Neurological: Negative for dizziness, light-headedness and numbness.  Psychiatric/Behavioral: Negative.  Negative for sleep disturbance and agitation.   Per HPI unless specifically indicated above     Objective:    BP 136/70 mmHg  Pulse 83  Temp(Src) 98.3 F (36.8 C) (Oral)  Resp 16  Ht 5\' 6"  (1.676 m)  Wt 151 lb (68.493 kg)  BMI 24.38 kg/m2  Wt Readings from Last 3 Encounters:  08/18/15 151 lb (68.493 kg)  04/28/15 147 lb (66.679 kg)  07/30/14 142 lb (64.411 kg)    Physical Exam  Constitutional: She is oriented to person, place, and  time. She appears well-developed and well-nourished.  HENT:  Head: Normocephalic and atraumatic.  Neck: Normal range of motion. Neck supple.  Cardiovascular: Normal rate, regular rhythm and normal heart sounds.  Exam reveals no gallop and no friction rub.   No murmur heard. Pulmonary/Chest: Effort normal and breath sounds normal. She has no wheezes. She exhibits no tenderness.  Abdominal: Soft. Normal appearance and bowel sounds are normal. She exhibits no distension and no mass. There is no tenderness. There is no rebound and no guarding.  Genitourinary: There is no rash, tenderness, lesion or injury on the right labia. There is no rash, tenderness, lesion or injury on the left labia. Cervix exhibits no discharge. No tenderness or bleeding in the vagina. No signs of injury around the vagina. No vaginal discharge found.    Musculoskeletal: Normal range of motion. She exhibits no edema or tenderness.       Right hand: She exhibits normal range of motion, no tenderness, no bony tenderness and no swelling. Normal sensation noted. Normal strength noted.       Hands: Lymphadenopathy:    She has no cervical adenopathy.  Neurological: She is alert and oriented to person, place, and time.  Skin: Skin is warm and dry.  Psychiatric: She has a normal mood and affect.   Results for orders placed or performed in visit on 04/28/15  Lipid Profile  Result Value Ref Range   Cholesterol, Total 224 (H) 100 - 199 mg/dL   Triglycerides 98 0 - 149 mg/dL   HDL 75 >39 mg/dL   VLDL Cholesterol Cal 20 5 - 40 mg/dL   LDL Calculated 129 (H) 0 - 99 mg/dL   Chol/HDL Ratio 3.0 0.0 - 4.4 ratio units  Comprehensive Metabolic Panel (CMET)  Result Value Ref Range   Glucose 89 65 - 99 mg/dL   BUN 19 8 - 27 mg/dL   Creatinine, Ser 0.76 0.57 - 1.00 mg/dL   GFR calc non Af Amer 80 >59 mL/min/1.73   GFR calc Af Amer 93 >59 mL/min/1.73   BUN/Creatinine Ratio 25 11 - 26   Sodium 141 134 - 144 mmol/L   Potassium 4.5 3.5  - 5.2 mmol/L   Chloride 98 96 - 106 mmol/L   CO2 25 18 - 29 mmol/L   Calcium 9.8 8.7 - 10.3 mg/dL   Total Protein 6.5 6.0 - 8.5 g/dL   Albumin 4.4 3.6 - 4.8 g/dL   Globulin, Total 2.1 1.5 - 4.5 g/dL   Albumin/Globulin Ratio 2.1 1.1 - 2.5   Bilirubin Total 0.4 0.0 - 1.2 mg/dL   Alkaline Phosphatase 60 39 - 117 IU/L   AST 15 0 - 40 IU/L   ALT 14 0 - 32 IU/L  Anemia Profile B  Result Value Ref Range   Total Iron Binding Capacity 296 250 - 450 ug/dL   UIBC 187  118 - 369 ug/dL   Iron 109 27 - 139 ug/dL   Iron Saturation 37 15 - 55 %   Ferritin 68 15 - 150 ng/mL   Vitamin B-12 591 211 - 946 pg/mL   Folate >20.0 >3.0 ng/mL   WBC 9.0 3.4 - 10.8 x10E3/uL   RBC 4.92 3.77 - 5.28 x10E6/uL   Hemoglobin 14.3 11.1 - 15.9 g/dL   Hematocrit 42.1 34.0 - 46.6 %   MCV 86 79 - 97 fL   MCH 29.1 26.6 - 33.0 pg   MCHC 34.0 31.5 - 35.7 g/dL   RDW 13.3 12.3 - 15.4 %   Platelets 408 (H) 150 - 379 x10E3/uL   Neutrophils 72 %   Lymphs 16 %   Monocytes 9 %   Eos 3 %   Basos 0 %   Neutrophils Absolute 6.4 1.4 - 7.0 x10E3/uL   Lymphocytes Absolute 1.4 0.7 - 3.1 x10E3/uL   Monocytes Absolute 0.8 0.1 - 0.9 x10E3/uL   EOS (ABSOLUTE) 0.3 0.0 - 0.4 x10E3/uL   Basophils Absolute 0.0 0.0 - 0.2 x10E3/uL   Immature Granulocytes 0 %   Immature Grans (Abs) 0.0 0.0 - 0.1 x10E3/uL   Retic Ct Pct 0.9 0.6 - 2.6 %      Assessment & Plan:   Problem List Items Addressed This Visit    None    Visit Diagnoses    Post-menopausal bleeding    -  Primary    Likely 2/2 tissue at cervical os. Refer to GYN for management and evaluation.     Relevant Orders    Ambulatory referral to Obstetrics / Gynecology    Polyp at cervical os        Tissues at cervical os looks most consistent with a polyp- may be the source of vaginal bleeding. Refer to GYN for evaluation for removal.     Relevant Orders    Ambulatory referral to Obstetrics / Gynecology    Trigger finger        Pt has chosen not to treat at this time as it not  painful. We will continue monitor if symptoms progress.        Meds ordered this encounter  Medications  . DISCONTD: triamterene-hydrochlorothiazide (MAXZIDE-25) 37.5-25 MG tablet    Sig: Take 1 tablet by mouth daily.    Refill:  11      Follow up plan: Return if symptoms worsen or fail to improve, for please follow-up with GYN. Marland Kitchen

## 2015-08-18 NOTE — Patient Instructions (Signed)
We are choosing not to treat your trigger finger today. Please let me know if you have symptoms.  We will have GYN evaluate your vaginal bleeding and see if the spot on your cervix needs to be removed. They will call you to schedule an appt.

## 2015-09-06 ENCOUNTER — Encounter: Payer: Self-pay | Admitting: Obstetrics and Gynecology

## 2015-09-06 ENCOUNTER — Ambulatory Visit (INDEPENDENT_AMBULATORY_CARE_PROVIDER_SITE_OTHER): Payer: Medicare Other | Admitting: Obstetrics and Gynecology

## 2015-09-06 VITALS — BP 129/78 | HR 87 | Ht 66.0 in | Wt 151.7 lb

## 2015-09-06 DIAGNOSIS — N841 Polyp of cervix uteri: Secondary | ICD-10-CM

## 2015-09-06 DIAGNOSIS — N950 Postmenopausal bleeding: Secondary | ICD-10-CM | POA: Diagnosis not present

## 2015-09-06 DIAGNOSIS — N952 Postmenopausal atrophic vaginitis: Secondary | ICD-10-CM | POA: Diagnosis not present

## 2015-09-06 NOTE — Progress Notes (Signed)
GYN ENCOUNTER NOTE  Subjective:       Michelle Fuentes is a 70 y.o. G16P2002 female is here for gynecologic evaluation of the following issues:  1. Post menopausal bleeding  70 yo postmenopausal female, not on HRT, presenting today with complaints of 1 month of bleeding.  She describes spotting, at least every other day, ranging from light pink to red tinge on pad.  Three year history of cervical polyp, monitored symptomatically by PCP, who referred her to this office for further management.   Gynecologic History No LMP recorded. Patient is postmenopausal. Contraception: post menopausal status   Obstetric History OB History  Gravida Para Term Preterm AB SAB TAB Ectopic Multiple Living  2 2 2       2     # Outcome Date GA Lbr Len/2nd Weight Sex Delivery Anes PTL Lv  2 Term 1976    F Vag-Spont   Y  1 Term 1975    F Vag-Spont   Y      Past Medical History  Diagnosis Date  . History of TIA (transient ischemic attack)   . Seasonal allergies   . Mixed hyperlipidemia   . Iron deficiency anemia   . Meniere disease   . Hypothyroidism   . Osteopenia   . GERD (gastroesophageal reflux disease)     Past Surgical History  Procedure Laterality Date  . Tonsillectomy    . Mole removed    . Eye surgery      Current Outpatient Prescriptions on File Prior to Visit  Medication Sig Dispense Refill  . aspirin 81 MG tablet Take 81 mg by mouth daily.     . Cholecalciferol 1000 UNITS capsule Take 1,000 Units by mouth daily.     . diazepam (VALIUM) 2 MG tablet Take 2 mg by mouth every 6 (six) hours as needed.     . fluticasone (FLONASE) 50 MCG/ACT nasal spray Place 2 sprays into both nostrils daily. 16 g 3  . Ginkgo Biloba Extract 60 MG CAPS Take 120 mg by mouth.    . levothyroxine (SYNTHROID, LEVOTHROID) 88 MCG tablet Take 1 tablet (88 mcg total) by mouth daily before breakfast. 90 tablet 1  . Lutein 6 MG CAPS Take 10 mg by mouth daily.     . Magnesium Gluconate 250 MG TABS Take 250 mg by  mouth daily.     . meclizine (ANTIVERT) 25 MG tablet Take 25 mg by mouth once as needed.     . prednisoLONE (ORAPRED ODT) 10 MG disintegrating tablet Take 10 mg by mouth. Every 5 days and if sx worst every 4 days    . ranitidine (ZANTAC) 150 MG tablet Take 150 mg by mouth.    . triamterene-hydrochlorothiazide (DYAZIDE) 37.5-25 MG per capsule Take 1 capsule by mouth daily.      No current facility-administered medications on file prior to visit.    Allergies  Allergen Reactions  . Erythromycin Base     Other reaction(s): Other (See Comments) Made her tongue sore    Social History   Social History  . Marital Status: Married    Spouse Name: N/A  . Number of Children: N/A  . Years of Education: N/A   Occupational History  . Not on file.   Social History Main Topics  . Smoking status: Never Smoker   . Smokeless tobacco: Not on file  . Alcohol Use: Yes     Comment: rare  . Drug Use: No  . Sexual Activity: Yes  Birth Control/ Protection: Post-menopausal   Other Topics Concern  . Not on file   Social History Narrative    Family History  Problem Relation Age of Onset  . Hypertension Father   . Melanoma Brother   . Lymphoma Brother   . Breast cancer Maternal Grandmother   . Ovarian cancer Neg Hx   . Colon cancer Neg Hx   . Diabetes Neg Hx   . Heart disease Neg Hx     The following portions of the patient's history were reviewed and updated as appropriate: allergies, current medications, past family history, past medical history, past social history, past surgical history and problem list.  Review of Systems Review of Systems - Negative except those listed in HPI. Review of Systems - General ROS: negative for - chills, fatigue, fever, hot flashes, malaise or night sweats Hematological and Lymphatic ROS: negative for - bleeding problems or swollen lymph nodes Gastrointestinal ROS: negative for - abdominal pain, blood in stools, change in bowel habits and  nausea/vomiting Musculoskeletal ROS: negative for - joint pain, muscle pain or muscular weakness Genito-Urinary ROS: negative for - change in menstrual cycle, dysmenorrhea, dyspareunia, dysuria, genital discharge, genital ulcers, hematuria, incontinence, irregular/heavy menses, nocturia or pelvic painjj  Objective:   BP 129/78 mmHg  Pulse 87  Ht 5\' 6"  (1.676 m)  Wt 151 lb 11 oz (68.806 kg)  BMI 24.50 kg/m2 CONSTITUTIONAL: Well-developed, well-nourished female in no acute distress.  SKIN: Skin is warm and dry. No rash noted. Not diaphoretic. No erythema. No pallor. [place]: Alert and oriented to person, place, and time.  PSYCHIATRIC: Normal mood and affect. Normal behavior. Normal judgment and thought content. PELVIC:  External Genitalia: atrophic vulva  BUS: Normal  Vagina: moderate atrophy  Cervix: parous, 1cm fleshy, erythemas polyp at OS  Uterus: Not examined  Adnexa: Not examined  RV: Not examined   Bladder: Not examined MUSCULOSKELETAL: Normal range of motion. No tenderness.  No cyanosis, clubbing, or edema.  PROCEDURE : ENDOCERVICAL POLYP REMOVAL Used ring forceps to grasp polyp and twisting to remove.  Monsel's applied for hemostasis.  Minimal blood loss.    Assessment:   1. Postmenopausal bleeding 2. Endocervical polyp 3. Vaginal atrophy    Plan:   1. Endocervical polyp removed in office. 2. No intercourse for 3 days. 3. Schedule pelvic ultrasound in next week to assess postmenopausal bleeding. 4. Return 1 week after ultrasound for possible endometrial biopsy and will discuss polyp pathology results at that time. 5.  Will call to cancel [name] follow-up if no pertinent findings and review pathology results at that time as necessary.  Michelle Doe Penninger, PA-S Brayton Mars, MD   I have seen, interviewed, and examined the patient in conjunction with the [facility].A. student and affirm the diagnosis and management plan. Martin A. DeFrancesco, MD,  FACOG   Note: This dictation was prepared with Dragon dictation along with smaller phrase technology. Any transcriptional errors that result from this process are unintentional.

## 2015-09-06 NOTE — Patient Instructions (Addendum)
1. Endocervical polyp is removed. 2. No Intercourse for 3 days. 3. Ultrasound is scheduled to assess postmenopausal bleeding 4. Return in 1 week after ultrasound for possible endometrial biopsy 5. Tylenol 2 extra strength every 6 hours as needed

## 2015-09-07 ENCOUNTER — Ambulatory Visit (INDEPENDENT_AMBULATORY_CARE_PROVIDER_SITE_OTHER): Payer: Medicare Other

## 2015-09-07 DIAGNOSIS — N841 Polyp of cervix uteri: Secondary | ICD-10-CM | POA: Diagnosis not present

## 2015-09-07 DIAGNOSIS — N950 Postmenopausal bleeding: Secondary | ICD-10-CM

## 2015-09-07 DIAGNOSIS — N952 Postmenopausal atrophic vaginitis: Secondary | ICD-10-CM | POA: Insufficient documentation

## 2015-09-09 LAB — PATHOLOGY

## 2015-09-19 ENCOUNTER — Telehealth: Payer: Self-pay | Admitting: Family Medicine

## 2015-09-19 ENCOUNTER — Telehealth: Payer: Self-pay | Admitting: Obstetrics and Gynecology

## 2015-09-19 DIAGNOSIS — Z1239 Encounter for other screening for malignant neoplasm of breast: Secondary | ICD-10-CM

## 2015-09-19 DIAGNOSIS — Z13820 Encounter for screening for osteoporosis: Secondary | ICD-10-CM

## 2015-09-19 NOTE — Telephone Encounter (Signed)
Michelle Fuentes called saying she's not received her results from her pathology report and her [facility]. She's wondering if she can get that information so she'll know if she needs to keep her appt on Thursday. Please contact her regarding this.  Pt's ph# [phone] Thank you.

## 2015-09-19 NOTE — Telephone Encounter (Signed)
Pt. Called requesting a bone density,mammogram.

## 2015-09-20 ENCOUNTER — Other Ambulatory Visit: Payer: Self-pay | Admitting: *Deleted

## 2015-09-20 DIAGNOSIS — H903 Sensorineural hearing loss, bilateral: Secondary | ICD-10-CM | POA: Diagnosis not present

## 2015-09-20 DIAGNOSIS — Z13820 Encounter for screening for osteoporosis: Secondary | ICD-10-CM

## 2015-09-20 DIAGNOSIS — Z1239 Encounter for other screening for malignant neoplasm of breast: Secondary | ICD-10-CM

## 2015-09-20 NOTE — Telephone Encounter (Signed)
Orders were placed. Please schedule DXA. Thanks! AK

## 2015-09-20 NOTE — Telephone Encounter (Signed)
Pt aware per [facility]- u/s only showed a fibroid. emb- endocervical polp. R/s her appt 6/8 at 7:45. Will call back once MAD reviews labs.

## 2015-09-20 NOTE — Telephone Encounter (Signed)
Called patient and gave contact information for [facility]. [phone]

## 2015-09-22 ENCOUNTER — Ambulatory Visit: Payer: Medicare Other | Admitting: Obstetrics and Gynecology

## 2015-10-11 ENCOUNTER — Telehealth: Payer: Self-pay | Admitting: *Deleted

## 2015-10-11 NOTE — Telephone Encounter (Signed)
Patient called and stated that Dr. Tennis Must wanted her to come in for U/S f/u . At her last visit she said that if the results was good she didn't have to come in . Patient is wondering do she still need her appt on 10/13/15?  Pt is requesting a call back. Call back number is [phone]. Patient states you can leave a message on her answering machine .

## 2015-10-12 NOTE — Telephone Encounter (Signed)
Pt aware per vm. RF to cx appt.

## 2015-10-13 ENCOUNTER — Ambulatory Visit: Payer: Medicare Other | Admitting: Obstetrics and Gynecology

## 2015-11-01 DIAGNOSIS — H8109 Meniere's disease, unspecified ear: Secondary | ICD-10-CM | POA: Diagnosis not present

## 2015-11-01 DIAGNOSIS — H903 Sensorineural hearing loss, bilateral: Secondary | ICD-10-CM | POA: Diagnosis not present

## 2015-12-16 ENCOUNTER — Telehealth: Payer: Self-pay | Admitting: Family Medicine

## 2015-12-16 MED ORDER — FLUTICASONE PROPIONATE 50 MCG/ACT NA SUSP: 2.0000 | Freq: Every day | NASAL | 3 refills | Status: DC

## 2015-12-16 MED ORDER — LEVOTHYROXINE SODIUM 88 MCG PO TABS: 88.0000 ug | ORAL_TABLET | Freq: Every day | ORAL | 3 refills | Status: DC

## 2015-12-16 NOTE — Telephone Encounter (Signed)
Pt needs a new prescription for flonase and a 3 month refill on levothyroxine sent to [facility] in [place].  Her call back numbers are [phone] and [phone]

## 2015-12-16 NOTE — Telephone Encounter (Signed)
Medications sent to pharmacy on file  ?

## 2016-01-05 ENCOUNTER — Telehealth: Payer: Self-pay | Admitting: Family Medicine

## 2016-01-05 NOTE — Telephone Encounter (Signed)
I am routing this is Caryl Pina for further investigation. Thanks! AK

## 2016-01-05 NOTE — Telephone Encounter (Signed)
Pt. Called states that' she was told that the code that was on her clm ?(chart)  her insurance may not pay for her  Mammogram.  Ptwanted to  Know if the  Code could be changed. Pt call back # is  [phone], Cell [phone]

## 2016-02-06 ENCOUNTER — Other Ambulatory Visit: Payer: Self-pay | Admitting: Family Medicine

## 2016-02-06 MED ORDER — FLUTICASONE PROPIONATE 50 MCG/ACT NA SUSP: 2.0000 | Freq: Every day | NASAL | 3 refills | Status: DC

## 2016-02-20 ENCOUNTER — Ambulatory Visit: Payer: Medicare Other | Admitting: Family Medicine

## 2016-02-20 ENCOUNTER — Ambulatory Visit (INDEPENDENT_AMBULATORY_CARE_PROVIDER_SITE_OTHER): Payer: Medicare Other | Admitting: Family Medicine

## 2016-02-20 ENCOUNTER — Encounter: Payer: Self-pay | Admitting: Family Medicine

## 2016-02-20 VITALS — BP 137/70 | HR 91 | Temp 97.9°F | Resp 16 | Ht 66.0 in | Wt 159.0 lb

## 2016-02-20 DIAGNOSIS — J301 Allergic rhinitis due to pollen: Secondary | ICD-10-CM

## 2016-02-20 DIAGNOSIS — H00014 Hordeolum externum left upper eyelid: Secondary | ICD-10-CM | POA: Diagnosis not present

## 2016-02-20 DIAGNOSIS — Z1231 Encounter for screening mammogram for malignant neoplasm of breast: Secondary | ICD-10-CM

## 2016-02-20 DIAGNOSIS — M8589 Other specified disorders of bone density and structure, multiple sites: Secondary | ICD-10-CM | POA: Diagnosis not present

## 2016-02-20 DIAGNOSIS — Z1239 Encounter for other screening for malignant neoplasm of breast: Secondary | ICD-10-CM

## 2016-02-20 DIAGNOSIS — J0100 Acute maxillary sinusitis, unspecified: Secondary | ICD-10-CM | POA: Diagnosis not present

## 2016-02-20 MED ORDER — DM-GUAIFENESIN ER 30-600 MG PO TB12: 1.0000 | ORAL_TABLET | Freq: Two times a day (BID) | ORAL | 0 refills | Status: DC

## 2016-02-20 MED ORDER — FLUTICASONE PROPIONATE 50 MCG/ACT NA SUSP: 2.0000 | Freq: Every day | NASAL | 11 refills | Status: DC

## 2016-02-20 MED ORDER — DOXYCYCLINE HYCLATE 100 MG PO TABS: 100.0000 mg | ORAL_TABLET | Freq: Two times a day (BID) | ORAL | 0 refills | Status: DC

## 2016-02-20 NOTE — Patient Instructions (Addendum)
You can use supportive care at home to help with your symptoms. I have sent Mucinex DM to your pharmacy to help break up the congestion and soothe your cough. You can takes this twice daily.  Nunzio Cory is a natural cough suppressant- so add it to your tea in the morning.  If you have a humidifer, set that up in your bedroom at night.   Take doxycycline twice daily for sinusitis for 7 days.    Stye A stye is a bump on your eyelid caused by a bacterial infection. A stye can form inside the eyelid (internal stye) or outside the eyelid (external stye). An internal stye may be caused by an infected oil-producing gland inside your eyelid. An external stye may be caused by an infection at the base of your eyelash (hair follicle). Styes are very common. Anyone can get them at any age. They usually occur in just one eye, but you may have more than one in either eye.  CAUSES  The infection is almost always caused by bacteria called Staphylococcus aureus. This is a common type of bacteria that lives on your skin. RISK FACTORS You may be at higher risk for a stye if you have had one before. You may also be at higher risk if you have:  Diabetes.  Long-term illness.  Long-term eye redness.  A skin condition called seborrhea.  High fat levels in your blood (lipids). SIGNS AND SYMPTOMS  Eyelid pain is the most common symptom of a stye. Internal styes are more painful than external styes. Other signs and symptoms may include:  Painful swelling of your eyelid.  A scratchy feeling in your eye.  Tearing and redness of your eye.  Pus draining from the stye. DIAGNOSIS  Your health care provider may be able to diagnose a stye just by examining your eye. The health care provider may also check to make sure:  You do not have a fever or other signs of a more serious infection.  The infection has not spread to other parts of your eye or areas around your eye. TREATMENT  Most styes will clear up in a few  days without treatment. In some cases, you may need to use antibiotic drops or ointment to prevent infection. Your health care provider may have to drain the stye surgically if your stye is:  Large.  Causing a lot of pain.  Interfering with your vision. This can be done using a thin blade or a needle.  HOME CARE INSTRUCTIONS   Take medicines only as directed by your health care provider.  Apply a clean, warm compress to your eye for 10 minutes, 4 times a day.  Do not wear contact lenses or eye makeup until your stye has healed.  Do not try to pop or drain the stye. SEEK MEDICAL CARE IF:  You have chills or a fever.  Your stye does not go away after several days.  Your stye affects your vision.  Your eyeball becomes swollen, red, or painful. MAKE SURE YOU:  Understand these instructions.  Will watch your condition.  Will get help right away if you are not doing well or get worse.   This information is not intended to replace advice given to you by your health care provider. Make sure you discuss any questions you have with your health care provider.   Document Released: 01/31/2005 Document Revised: 05/14/2014 Document Reviewed: 08/07/2013 Elsevier Interactive Patient Education [employer].

## 2016-02-20 NOTE — Assessment & Plan Note (Signed)
Order repeat DXA scan since it has been 3 years.

## 2016-02-20 NOTE — Progress Notes (Signed)
Subjective:    Patient ID: Michelle Fuentes, female    DOB: October 23, 1945, 70 y.o.   MRN: [id number]  HPI: Michelle Fuentes is a 70 y.o. female presenting on 02/20/2016 for Sinusitis (facial  pain more on Left side and around eyes may be a stye on Left side)   HPI  Pt presents for L sided facial pain and swelling. Having congestion and facial swelling. L ear pain. Using flonase and claritin with little relief. Green nasal drainage.  Discomfort to touch on her face when putting on make-up. Has 2 spots and on L temple- that are not sore to touch. No pus or drainge. She does have a knot on her L upper eye.Sore and tender. H/o blehpharitis. Pain and swelling in the eye lid.  No shortness of breath. Non-productive cough. No chest tightness.  Pt needs new orders for DXA scan and mammogram.   Past Medical History:  Diagnosis Date  . GERD (gastroesophageal reflux disease)   . History of TIA (transient ischemic attack)   . Hypothyroidism   . Iron deficiency anemia   . Meniere disease   . Mixed hyperlipidemia   . Osteopenia   . Seasonal allergies     Current Outpatient Prescriptions on File Prior to Visit  Medication Sig  . aspirin 81 MG tablet Take 81 mg by mouth daily.   . Cholecalciferol 1000 UNITS capsule Take 1,000 Units by mouth daily.   . diazepam (VALIUM) 2 MG tablet Take 2 mg by mouth every 6 (six) hours as needed.   . fluticasone (FLONASE) 50 MCG/ACT nasal spray Place 2 sprays into both nostrils daily.  . Ginkgo Biloba Extract 60 MG CAPS Take 120 mg by mouth.  . levothyroxine (SYNTHROID, LEVOTHROID) 88 MCG tablet Take 1 tablet (88 mcg total) by mouth daily before breakfast.  . Lutein 6 MG CAPS Take 10 mg by mouth daily.   . Magnesium Gluconate 250 MG TABS Take 250 mg by mouth daily.   . meclizine (ANTIVERT) 25 MG tablet Take 25 mg by mouth once as needed.   . prednisoLONE (ORAPRED ODT) 10 MG disintegrating tablet Take 10 mg by mouth. Every 5 days and if sx worst every 4 days  .  ranitidine (ZANTAC) 150 MG tablet Take 150 mg by mouth.  . triamterene-hydrochlorothiazide (DYAZIDE) 37.5-25 MG per capsule Take 1 capsule by mouth daily.    No current facility-administered medications on file prior to visit.     Review of Systems  Constitutional: Negative for chills and fever.  HENT: Positive for congestion, facial swelling and sinus pressure. Negative for ear pain, sneezing and sore throat.   Eyes: Positive for redness (L eyelid).  Respiratory: Negative for cough, chest tightness and wheezing.   Cardiovascular: Negative for chest pain and palpitations.  Gastrointestinal: Negative.  Negative for diarrhea, nausea and vomiting.  Musculoskeletal: Negative for neck pain and neck stiffness.  Neurological: Positive for headaches.   Per HPI unless specifically indicated above     Objective:    BP 137/70   Pulse 91   Temp 97.9 F (36.6 C) (Oral)   Resp 16   Ht 5\' 6"  (1.676 m)   Wt 159 lb (72.1 kg)   BMI 25.66 kg/m   Wt Readings from Last 3 Encounters:  02/20/16 159 lb (72.1 kg)  09/06/15 151 lb 11 oz (68.8 kg)  08/18/15 151 lb (68.5 kg)    Physical Exam  Constitutional: She appears well-developed and well-nourished. No distress.  HENT:  Head:  Normocephalic and atraumatic.  Right Ear: Hearing and tympanic membrane normal. Tympanic membrane is not erythematous and not bulging.  Left Ear: Hearing and tympanic membrane normal. Tympanic membrane is not erythematous and not bulging.  Nose: Mucosal edema and rhinorrhea present. No sinus tenderness or nasal septal hematoma. Right sinus exhibits no maxillary sinus tenderness and no frontal sinus tenderness. Left sinus exhibits maxillary sinus tenderness and frontal sinus tenderness.  Mouth/Throat: Uvula is midline and mucous membranes are normal. No uvula swelling. No posterior oropharyngeal edema or posterior oropharyngeal erythema.  Eyes: Conjunctivae and EOM are normal. Pupils are equal, round, and reactive to light.  Left eye exhibits hordeolum. Left eye exhibits no chemosis, no discharge and no exudate. Left conjunctiva is not injected. Left conjunctiva has no hemorrhage.  Neck: Neck supple. No Brudzinski's sign and no Kernig's sign noted.  Cardiovascular: Normal rate, regular rhythm and normal heart sounds.   Pulmonary/Chest: Breath sounds normal. No accessory muscle usage. No tachypnea. No respiratory distress.  Lymphadenopathy:    She has no cervical adenopathy.   Results for orders placed or performed in visit on 09/06/15  Pathology  Result Value Ref Range   PATH REPORT.SITE OF ORIGIN SPEC Comment    . Comment    PATH REPORT.RELEVANT HX SPEC Comment    PATH REPORT.FINAL DX SPEC Comment    SIGNED OUT BY: Comment    GROSS DESCRIPTION: Comment    . Comment    PAYMENT PROCEDURE Comment       Assessment & Plan:   Problem List Items Addressed This Visit    None    Visit Diagnoses   None.     No orders of the defined types were placed in this encounter.     Follow up plan: No Follow-up on file.

## 2016-02-22 ENCOUNTER — Ambulatory Visit
Admission: RE | Admit: 2016-02-22 | Discharge: 2016-02-22 | Disposition: A | Discharge status: Home / Self Care | Payer: Medicare Other | Source: Ambulatory Visit | Attending: Family Medicine | Admitting: Family Medicine

## 2016-02-22 ENCOUNTER — Telehealth: Payer: Self-pay | Admitting: Family Medicine

## 2016-02-22 DIAGNOSIS — Z1231 Encounter for screening mammogram for malignant neoplasm of breast: Secondary | ICD-10-CM | POA: Diagnosis not present

## 2016-02-22 DIAGNOSIS — M8589 Other specified disorders of bone density and structure, multiple sites: Secondary | ICD-10-CM | POA: Insufficient documentation

## 2016-02-22 DIAGNOSIS — Z1239 Encounter for other screening for malignant neoplasm of breast: Secondary | ICD-10-CM

## 2016-02-22 DIAGNOSIS — M8588 Other specified disorders of bone density and structure, other site: Secondary | ICD-10-CM | POA: Diagnosis not present

## 2016-02-22 NOTE — Telephone Encounter (Signed)
Pt's eyelid is a little more swollen and red and more small bumps have popped up above eyelid and on forehead.  Amy said she could call in an ointment for that.  Her call back number is [phone]

## 2016-02-23 ENCOUNTER — Encounter: Payer: Self-pay | Admitting: Family Medicine

## 2016-02-23 ENCOUNTER — Ambulatory Visit (INDEPENDENT_AMBULATORY_CARE_PROVIDER_SITE_OTHER): Payer: Medicare Other | Admitting: Family Medicine

## 2016-02-23 VITALS — BP 145/83 | HR 87 | Temp 98.6°F | Resp 16 | Ht 65.0 in | Wt 160.0 lb

## 2016-02-23 DIAGNOSIS — B0222 Postherpetic trigeminal neuralgia: Secondary | ICD-10-CM | POA: Insufficient documentation

## 2016-02-23 DIAGNOSIS — B0231 Zoster conjunctivitis: Secondary | ICD-10-CM

## 2016-02-23 DIAGNOSIS — H01004 Unspecified blepharitis left upper eyelid: Secondary | ICD-10-CM | POA: Diagnosis not present

## 2016-02-23 DIAGNOSIS — B028 Zoster with other complications: Secondary | ICD-10-CM | POA: Diagnosis not present

## 2016-02-23 MED ORDER — VALACYCLOVIR HCL 1 G PO TABS: 1000.0000 mg | ORAL_TABLET | Freq: Three times a day (TID) | ORAL | 0 refills | Status: DC

## 2016-02-23 MED ORDER — ERYTHROMYCIN 5 MG/GM OP OINT: 1.0000 "application " | TOPICAL_OINTMENT | Freq: Three times a day (TID) | OPHTHALMIC | 0 refills | Status: DC

## 2016-02-23 NOTE — Patient Instructions (Addendum)
Thank you for coming in to clinic today.  1. Take Valacyclovir viral medicine 3 times a day for 7 days - If pain develops worsening or burning, let us know and we can start gabapentin for nerve pain  Use erythryomycin ointment in eye up to 3 times a day for 1 week  May continue warm compresses  [facility]   [address], [place], [place] [address] Phone: [phone]  Wyonia Hough, MD Loura Back. Dingeldein, MD   Please schedule a follow-up appointment with Dr. Parks Ranger in 2 weeks if Left eye rash not improved  If you have any other questions or concerns, please feel free to call the clinic or send a message through [place]. You may also schedule an earlier appointment if necessary.  Nobie Putnam, DO [place]

## 2016-02-23 NOTE — Progress Notes (Signed)
Subjective:    Patient ID: Michelle Fuentes, female    DOB: Feb 05, 1946, 70 y.o.   MRN: [id number]  Michelle Fuentes is a 70 y.o. female presenting on 02/23/2016 for Belepharitis (Left eye swollen onset for 6 days rsised patches and tender when pressed )  Patient presents for a same day appointment.  HPI  LEFT EYE / FACE RASH and BELPHARITIS UPPER LID - Reports symptoms started 5 days ago with some itching and red raised rash on her left face, also with a "stinging" sensation in her left upper eyelid, later with eyelid swelling and redness. Onset while she was out on a trip to mountains and sight seeing near [facility], she was not in heavily forested area and did not do hiking, admits to lots of "bugs flying around" but no obvious tick or distinct bug bite that she remembers. - She was seen in office here at [facility] on 02/20/16 3 days ago for same complaint but rash was significantly more mild at that time, she only had 1-2 red irritated spots on left upper face and her Left eyelid with swelling and possible stye. She was started on Doxycycline for possible sinus infection as well and to cover for bacterial involvement of her skin. - Today she has worsening rash with some symptoms of itching, occasional burning, and a few sharp twinges of pain in scalp on left side very brief. Still using warm compresses to clear eye discharge and drainage. - Taking Flonase, Claritin, has not started mucinex yet, states sinus symptoms not worsening - S/p Zostavax 3 years ago, has never had Shingles before. - Denies any fevers/chills, rash elsewhere on body, headache, vision loss or blurred vision, numbness, tingling, weakness   Social History  Substance Use Topics  . Smoking status: Never Smoker  . Smokeless tobacco: Never Used  . Alcohol use Yes     Comment: rare    Review of Systems Per HPI unless specifically indicated above     Objective:    BP (!) 145/83 (BP Location: Right Arm, Patient  Position: Sitting, Cuff Size: Normal)   Pulse 87   Temp 98.6 F (37 C) (Oral)   Resp 16   Ht 5\' 5"  (1.651 m)   Wt 160 lb (72.6 kg)   BMI 26.63 kg/m   Wt Readings from Last 3 Encounters:  02/23/16 160 lb (72.6 kg)  02/20/16 159 lb (72.1 kg)  09/06/15 151 lb 11 oz (68.8 kg)    Physical Exam  Constitutional: She appears well-developed and well-nourished. No distress.  Well-appearing, comfortable, cooperative  HENT:  Frontal Left sinus minimal tender. Bilateral maxillary sinuses non-tender. Nares patent without purulence or edema. Bilateral TMs clear without erythema, effusion or bulging. Oropharynx clear without erythema, exudates, edema or asymmetry.  Eyes: EOM are normal. Pupils are equal, round, and reactive to light. Right eye exhibits no discharge. Left eye exhibits no discharge.  Left eye mild conjunctival injection. Left upper eyelid soft mild edema with erythema, central point on external upper eyelid possible resolved ext hordeolum without palpable mass today. Evidence of upper blepharitis with some flaking skin and crusty discharge resolved.  Skin: Skin is warm and dry. Rash (Scattered erythematous vesicular rash appears in trigeminal dermatomal pattern on Left upper lateral face involvement near eyelid, with erythematous upper L eyelid see other exam) noted. She is not diaphoretic.  Nursing note and vitals reviewed.          Assessment & Plan:   Problem List Items  Addressed This Visit    Herpes zoster conjunctivitis of left eye    Suspected HZV, with Left facial involvement, see A&P above - Left eye involvement with conjunctivitis, unclear if this is blepharitis vs HZV - Urgent work in at Ophthalmology, contacted [facility] (spoke with triage nurse for Dr Sandra Cockayne) and arranged urgent follow-up work in appointment today with Dr Sandra Cockayne, patient advised to go directly to their office now, before 10:00am and they will see her. I deferred any further eye testing  today and did not perform fluorescin dye since she will be at ophtho soon      Relevant Medications   valACYclovir (VALTREX) 1000 MG tablet   Acute trigeminal herpes zoster - Primary    Suspected HZV with concern with characteristic erythematous vesicular rash across skin of left face lateral eye and left upper eyelid, seems to be in trigeminal dermatomal pattern, gradual worsening over >4 days, associated with Left upper eyelid involvement likely blepharitis (history of recurrent problem) - S/p Zostavax 3 years ago, no prior shingles - No evidence of eye involvement on direct ophthalmoscope today. No visual loss.  Plan: 1. Start Valacyclovir 1g TID for 7 days anti viral treatment 2. [facility] (spoke with triage nurse for Dr Sandra Cockayne) and arranged urgent follow-up work in appointment today with Dr Sandra Cockayne, patient advised to go directly to their office now, before 10:00am and they will see her. I deferred any further eye testing today and did not perform fluorescin dye since she will be at ophtho soon 3. Follow-up within next few weeks pending progress, given minimal pain now, perhaps her immunization with Zostavax will limit post-herpetic neuralgia problems, if needed consider gabapentin or other neuropathic analgesia      Relevant Medications   valACYclovir (VALTREX) 1000 MG tablet    Other Visit Diagnoses    Blepharitis of left upper eyelid, unspecified type       Add erythromycin opht ointment TID for 1 week for additional coverage, she is to follow-up with Ophtho first, may make change to treatment.   Relevant Medications   erythromycin ophthalmic ointment      Meds ordered this encounter  Medications  . valACYclovir (VALTREX) 1000 MG tablet    Sig: Take 1 tablet (1,000 mg total) by mouth 3 (three) times daily. For 7 days    Dispense:  21 tablet    Refill:  0  . erythromycin ophthalmic ointment    Sig: Place 1 application into the left eye 3 (three)  times daily. For 1 week    Dispense:  3.5 g    Refill:  0    Follow up plan: Return in about 2 weeks (around 03/08/2016), or if symptoms worsen or fail to improve, for left eye shingles, blepharitis.  Nobie Putnam, [facility] 02/23/2016, 12:41 PM

## 2016-02-23 NOTE — Assessment & Plan Note (Signed)
Suspected HZV, with Left facial involvement, see A&P above - Left eye involvement with conjunctivitis, unclear if this is blepharitis vs HZV - Urgent work in at Ophthalmology, contacted [facility] (spoke with triage nurse for Dr Sandra Cockayne) and arranged urgent follow-up work in appointment today with Dr Sandra Cockayne, patient advised to go directly to their office now, before 10:00am and they will see her. I deferred any further eye testing today and did not perform fluorescin dye since she will be at ophtho soon

## 2016-02-23 NOTE — Assessment & Plan Note (Addendum)
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 No visual loss.  Plan: 1. Start Valacyclovir 1g TID for 7 days anti viral treatment 2. [place] (spoke with triage nurse for Dr Sandra Cockayne) and arranged urgent follow-up work in appointment today with Dr Sandra Cockayne, patient advised to go directly to their office now, before 10:00am and they will see her. I deferred any further eye testing today and did not perform fluorescin dye since she will be at ophtho soon 3. Follow-up within next few weeks pending progress, given minimal pain now, perhaps her immunization with Zostavax will limit post-herpetic neuralgia problems, if needed consider gabapentin or other neuropathic analgesia

## 2016-02-23 NOTE — Telephone Encounter (Signed)
Pt is seeing Dr. Raliegh Ip today.

## 2016-02-24 ENCOUNTER — Telehealth: Payer: Self-pay | Admitting: Family Medicine

## 2016-02-24 DIAGNOSIS — B0222 Postherpetic trigeminal neuralgia: Secondary | ICD-10-CM

## 2016-02-24 MED ORDER — HYDROXYZINE HCL 10 MG PO TABS: 5.0000 mg | ORAL_TABLET | Freq: Two times a day (BID) | ORAL | 0 refills | Status: DC | PRN

## 2016-02-24 MED ORDER — GABAPENTIN 100 MG PO CAPS: ORAL_CAPSULE | ORAL | 1 refills | Status: DC

## 2016-02-24 NOTE — Telephone Encounter (Signed)
Last seen 02/23/16 for left eye rash and concern for shingles. Started patient on Valacyclovir. Sent immediately to Ophthalmology, awaiting records from Ophtho. Advised patient may need gabapentin if worsening pain.  Sent in Gabapentin 100mg  capsules titration rx and Hydroxyzine low dose PRN itching.  Follow-up as planned 1-2 weeks for suspected shingles.  Nobie Putnam, [facility] 02/24/2016, 10:04 AM

## 2016-02-24 NOTE — Telephone Encounter (Signed)
Pt. Called requesting gabapentin, pt  Also requested something for Itching [facility]

## 2016-03-01 DIAGNOSIS — B028 Zoster with other complications: Secondary | ICD-10-CM | POA: Diagnosis not present

## 2016-03-09 DIAGNOSIS — B028 Zoster with other complications: Secondary | ICD-10-CM | POA: Diagnosis not present

## 2016-03-21 ENCOUNTER — Ambulatory Visit (INDEPENDENT_AMBULATORY_CARE_PROVIDER_SITE_OTHER): Payer: Medicare Other | Admitting: Family Medicine

## 2016-03-21 ENCOUNTER — Encounter: Payer: Self-pay | Admitting: Family Medicine

## 2016-03-21 VITALS — BP 148/68 | HR 90 | Temp 97.9°F | Resp 16 | Ht 65.0 in | Wt 160.0 lb

## 2016-03-21 DIAGNOSIS — B0222 Postherpetic trigeminal neuralgia: Secondary | ICD-10-CM | POA: Diagnosis not present

## 2016-03-21 DIAGNOSIS — B0231 Zoster conjunctivitis: Secondary | ICD-10-CM

## 2016-03-21 MED ORDER — HYDROXYZINE HCL 10 MG PO TABS: 5.0000 mg | ORAL_TABLET | Freq: Two times a day (BID) | ORAL | 2 refills | Status: DC | PRN

## 2016-03-21 MED ORDER — TRIAMCINOLONE ACETONIDE 0.5 % EX CREA: 1.0000 "application " | TOPICAL_CREAM | Freq: Two times a day (BID) | CUTANEOUS | 1 refills | Status: DC

## 2016-03-21 NOTE — Progress Notes (Signed)
Subjective:    Patient ID: Michelle Fuentes, female    DOB: [birth year], 70 y.o.   MRN: [id number]  Michelle Fuentes is a 70 y.o. female presenting on 03/21/2016 for Herpes Zoster (follow up)  HPI  FOLLOW-UP SHINGLES, Left Face / Eye - Last visit 10/19 saw me at [facility], initial diagnosis of herpes zoster shingles of left upper face / scalp, trigeminal region, initially with early rash symptoms within days of onset on presentation, treated with Valacyclovir 1g TID x 7 days, given concern of potential opthalmic complication with possible L eye conjunctivitis, urgently referred to Ophtho, established with [place] presents in follow-up about 1 month later. Significant improvement with healing of facial rash lesions, initial severe shooting pains from scalp, ear, to face have resolved. Now only experiences occasional tingling, crawling and pins sensation in distribution of rash. Still has small residual rash irritation on left upper eyelid - Not taking gabapentin regularly, admits she was confused by instructions, but takes 1-2 at night, some sedation, also taking hydroxyzine occasionally for itching - Followed by Dr Sandra Cockayne [facility]), initial visit without eye or retinal involvement, then she reports follow-up visit had some eye involvement, treated with steroid eye drops and antibiotic drops. Next follow-up in a week. - Denies any headache, new rash, extending numbness, tingling, hearing loss, vision loss  Social History  Substance Use Topics  . Smoking status: Never Smoker  . Smokeless tobacco: Never Used  . Alcohol use Yes     Comment: rare    Review of Systems Per HPI unless specifically indicated above     Objective:    BP (!) 148/68   Pulse 90   Temp 97.9 F (36.6 C) (Oral)   Resp 16   Ht 5\' 5"  (1.651 m)   Wt 160 lb (72.6 kg)   BMI 26.63 kg/m   Wt Readings from Last 3 Encounters:  03/21/16 160 lb (72.6 kg)  02/23/16 160 lb (72.6 kg)    02/20/16 159 lb (72.1 kg)    Physical Exam  Constitutional: She appears well-developed and well-nourished. No distress.  Well-appearing, comfortable, cooperative  HENT:  Head: Normocephalic and atraumatic.  Mouth/Throat: Oropharynx is clear and moist.  Bilateral TMs clear without erythema, effusion or bulging. No blisters or ear canal involvement.  Eyes: Conjunctivae and EOM are normal. Pupils are equal, round, and reactive to light. Right eye exhibits no discharge. Left eye exhibits no discharge.  No evidence of conjunctivitis. No peri-orbital or eyelid edema.  Neurological: She is alert.  Skin: Skin is warm and dry. Rash (Significantly resolved left facial rash. Now only mild residual erythematous upper L eyelid ) noted. She is not diaphoretic.  Nursing note and vitals reviewed.     Assessment & Plan:   Problem List Items Addressed This Visit    Herpes zoster conjunctivitis of left eye    Followed by Ophthalmology, Dr Sandra Cockayne [facility]) [place] on steroid eye drops      Relevant Medications   hydrOXYzine (ATARAX/VISTARIL) 10 MG tablet   Acute trigeminal herpes zoster - Primary    Resolving left facial trigeminal VZV shingles outbreak, now with mild remaining healing rash and mild residual post-herpetic symptoms, currently 1 month since onset. - Complication with Left eye VZV conjunctivitis, treated by Ophthalmology on steroid eye drops   Plan: 1. Reassurance, acute VZV episode seems to be mostly resolved, and suspect gradual improvement of current post-herpetic symptoms as continues to heal, unsure duration can  have some chronic residual symptoms, however should be less severe since s/p receiving Zostavax 3 years ago 2. Re-discussed gabapentin titration instructions, may need for weeks to months to control symptoms 3. Refill Hydroxyzine for itching as continues to heal, use PRN, also given rx Triamcinolone 0.5% cream up to 1-2 weeks then PRN, avoid contact with  eyelids 4. Continue follow-up with Ophtho 5. Follow-up as needed      Relevant Medications   hydrOXYzine (ATARAX/VISTARIL) 10 MG tablet   triamcinolone cream (KENALOG) 0.5 %      Meds ordered this encounter  Medications  . hydrOXYzine (ATARAX/VISTARIL) 10 MG tablet    Sig: Take 0.5-1 tablets (5-10 mg total) by mouth 2 (two) times daily as needed for itching. Caution with sedation if taking with gabapentin.    Dispense:  30 tablet    Refill:  2  . triamcinolone cream (KENALOG) 0.5 %    Sig: Apply 1 application topically 2 (two) times daily. To affected areas, for up to 2 weeks.    Dispense:  30 g    Refill:  1    Follow up plan: Return in about 3 months (around 06/21/2016).  Nobie Putnam, [facility] 03/21/2016, 10:21 PM

## 2016-03-21 NOTE — Assessment & Plan Note (Signed)
Followed by Ophthalmology, Dr Sandra Cockayne [facility]) Continues on steroid eye drops

## 2016-03-21 NOTE — Assessment & Plan Note (Signed)
Resolving left facial trigeminal VZV shingles outbreak, now with mild remaining healing rash and mild residual post-herpetic symptoms, currently 1 month since onset. - Complication with Left eye VZV conjunctivitis, treated by Ophthalmology on steroid eye drops   Plan: 1. Reassurance, acute VZV episode seems to be mostly resolved, and suspect gradual improvement of current post-herpetic symptoms as continues to heal, unsure duration can have some chronic residual symptoms, however should be less severe since s/p receiving Zostavax 3 years ago 2. Re-discussed gabapentin titration instructions, may need for weeks to months to control symptoms 3. Refill Hydroxyzine for itching as continues to heal, use PRN, also given rx Triamcinolone 0.5% cream up to 1-2 weeks then PRN, avoid contact with eyelids 4. Continue follow-up with Ophtho 5. Follow-up as needed

## 2016-03-21 NOTE — Patient Instructions (Signed)
Thank you for coming in to clinic today.  1. I am reassured that your Shingles outbreak is healing well. As discussed the residual symptoms of tingling and nerve pain can last for weeks to months, sometime they can come back years later. - Itching is most likely with healing tissue and rash, but it can be from nerve injury as well, this should improve over time as well  Take hydroxyzine as discussed.  Start Gabapentin 100mg  capsules now at 2 times a day for a few days, and see how tolerated if it makes you too sleepy or sedated, if not then you may increase to 3 times a day - You may ultimately take it 1 capsule (100mg ) 3 times a day, OR 3 capsules at night, which ever helps the most  Triamcinolone cream use on affected areas of face for itching up to 2 weeks then stop, may re use if needed  Please request records from [facility] to be sent to [name].  Please schedule a follow-up appointment with Dr. Parks Ranger as needed within 3 months for follow-up  If you have any other questions or concerns, please feel free to call the clinic or send a message through [place]. You may also schedule an earlier appointment if necessary.  Nobie Putnam, DO [place]

## 2016-03-27 DIAGNOSIS — B028 Zoster with other complications: Secondary | ICD-10-CM | POA: Diagnosis not present

## 2016-04-13 DIAGNOSIS — B028 Zoster with other complications: Secondary | ICD-10-CM | POA: Diagnosis not present

## 2016-05-16 DIAGNOSIS — B028 Zoster with other complications: Secondary | ICD-10-CM | POA: Diagnosis not present

## 2016-05-21 DIAGNOSIS — H903 Sensorineural hearing loss, bilateral: Secondary | ICD-10-CM | POA: Diagnosis not present

## 2016-06-19 DIAGNOSIS — Z9889 Other specified postprocedural states: Secondary | ICD-10-CM | POA: Diagnosis not present

## 2016-06-29 DIAGNOSIS — B028 Zoster with other complications: Secondary | ICD-10-CM | POA: Diagnosis not present

## 2016-07-31 DIAGNOSIS — B028 Zoster with other complications: Secondary | ICD-10-CM | POA: Diagnosis not present

## 2016-09-26 ENCOUNTER — Telehealth: Payer: Self-pay | Admitting: Family Medicine

## 2016-09-26 DIAGNOSIS — J301 Allergic rhinitis due to pollen: Secondary | ICD-10-CM

## 2016-09-26 MED ORDER — FLUTICASONE PROPIONATE 50 MCG/ACT NA SUSP: 2.0000 | Freq: Every day | NASAL | 11 refills | Status: DC

## 2016-09-26 NOTE — Telephone Encounter (Signed)
Refilled [place], DO [facility] 09/26/2016, 6:15 PM

## 2016-09-26 NOTE — Telephone Encounter (Signed)
Pt needs a [facility] [place]  Her call back number is [phone]

## 2016-11-21 DIAGNOSIS — H8102 Meniere's disease, left ear: Secondary | ICD-10-CM | POA: Diagnosis not present

## 2016-11-21 DIAGNOSIS — H903 Sensorineural hearing loss, bilateral: Secondary | ICD-10-CM | POA: Diagnosis not present

## 2017-01-02 ENCOUNTER — Other Ambulatory Visit: Payer: Self-pay | Admitting: Family Medicine

## 2017-01-02 DIAGNOSIS — E039 Hypothyroidism, unspecified: Secondary | ICD-10-CM

## 2017-01-02 MED ORDER — LEVOTHYROXINE SODIUM 88 MCG PO TABS: 88.0000 ug | ORAL_TABLET | Freq: Every day | ORAL | 0 refills | Status: DC

## 2017-01-02 NOTE — Telephone Encounter (Signed)
Refill sent Levothyroxine 53mcg daily given 90 pills for a 3 month supply with 0 refills.  She has not had TSH lab from our office in past >1 year. She would be due for all routine blood work including Thyroid test.  Recommend that she schedule an office visit to follow-up / she can schedule for morning visit and present fasting, and we can draw labs same day, or can she schedule routine visit and we can order blood work for future.  Nobie Putnam, [facility] 01/02/2017, 5:15 PM

## 2017-01-02 NOTE — Telephone Encounter (Signed)
Pt. Called  Requesting a refill on  Levothyroxine 88 mcg. Pt. Call back  # is  [phone]

## 2017-01-28 DIAGNOSIS — B028 Zoster with other complications: Secondary | ICD-10-CM | POA: Diagnosis not present

## 2017-02-13 DIAGNOSIS — H9312 Tinnitus, left ear: Secondary | ICD-10-CM | POA: Diagnosis not present

## 2017-02-13 DIAGNOSIS — H903 Sensorineural hearing loss, bilateral: Secondary | ICD-10-CM | POA: Diagnosis not present

## 2017-03-19 ENCOUNTER — Other Ambulatory Visit: Payer: Self-pay

## 2017-03-19 ENCOUNTER — Other Ambulatory Visit: Payer: Self-pay | Admitting: Nurse Practitioner

## 2017-03-19 ENCOUNTER — Ambulatory Visit (INDEPENDENT_AMBULATORY_CARE_PROVIDER_SITE_OTHER): Payer: Medicare HMO | Admitting: Nurse Practitioner

## 2017-03-19 ENCOUNTER — Telehealth: Payer: Self-pay | Admitting: Nurse Practitioner

## 2017-03-19 ENCOUNTER — Encounter: Payer: Self-pay | Admitting: Nurse Practitioner

## 2017-03-19 VITALS — BP 143/63 | HR 93 | Temp 98.3°F | Resp 18 | Ht 65.0 in | Wt 159.4 lb

## 2017-03-19 DIAGNOSIS — R7309 Other abnormal glucose: Secondary | ICD-10-CM

## 2017-03-19 DIAGNOSIS — B9689 Other specified bacterial agents as the cause of diseases classified elsewhere: Secondary | ICD-10-CM

## 2017-03-19 DIAGNOSIS — E785 Hyperlipidemia, unspecified: Secondary | ICD-10-CM

## 2017-03-19 DIAGNOSIS — E039 Hypothyroidism, unspecified: Secondary | ICD-10-CM

## 2017-03-19 DIAGNOSIS — Z862 Personal history of diseases of the blood and blood-forming organs and certain disorders involving the immune mechanism: Secondary | ICD-10-CM

## 2017-03-19 DIAGNOSIS — J019 Acute sinusitis, unspecified: Secondary | ICD-10-CM | POA: Diagnosis not present

## 2017-03-19 DIAGNOSIS — I6523 Occlusion and stenosis of bilateral carotid arteries: Secondary | ICD-10-CM

## 2017-03-19 MED ORDER — BENZONATATE 100 MG PO CAPS: 100.0000 mg | ORAL_CAPSULE | Freq: Two times a day (BID) | ORAL | 0 refills | Status: DC | PRN

## 2017-03-19 MED ORDER — DOXYCYCLINE HYCLATE 100 MG PO TABS: 100.0000 mg | ORAL_TABLET | Freq: Two times a day (BID) | ORAL | 0 refills | Status: DC

## 2017-03-19 NOTE — Progress Notes (Signed)
Labs only for prior to physical.

## 2017-03-19 NOTE — Progress Notes (Signed)
Subjective:    Patient ID: Michelle Fuentes, female    DOB: 03-27-1946, 71 y.o.   MRN: [id number]  Michelle Fuentes is a 71 y.o. female presenting on 03/19/2017 for Cough (presistant cough, post nasal drainage x 6 days )   HPI URI symptoms Wed pm Thurs am woke up w/ cough.  Cough and headaches began 6-7 days ago. Each day is progressively worsening.   - Last night couldn't rest r/t sensation of choking w/ post nasal drainage and cough - Drinking warm liquid helps soothe throat - Has been taking fluticasone 2 sprays once daily, once daily antihistamine loratadine for allergic rhinitis and currently.  Notes some improvement after taking both, but does not have all day relief. Headaches in early am and return as day progresses.  Headaches have been generally worse every day.   - Pt feels some subjective fever and occasional chills.   - denies n/v constipation and diarrhea.   - Denies changes in hearing or ear pain.  Has felt cracking noise in r jaw, but is not new to URI symptoms.  Social History   Tobacco Use  . Smoking status: Never Smoker  . Smokeless tobacco: Never Used  Substance Use Topics  . Alcohol use: Yes    Comment: rare  . Drug use: No    Review of Systems Per HPI unless specifically indicated above     Objective:    BP (!) 143/63 (BP Location: Right Arm, Patient Position: Sitting, Cuff Size: Normal)   Pulse 93   Temp 98.3 F (36.8 C) (Oral)   Resp 18   Ht 5\' 5"  (1.651 m)   Wt 159 lb 6.4 oz (72.3 kg)   SpO2 100%   BMI 26.53 kg/m   Wt Readings from Last 3 Encounters:  03/19/17 159 lb 6.4 oz (72.3 kg)  03/21/16 160 lb (72.6 kg)  02/23/16 160 lb (72.6 kg)    Physical Exam  Constitutional: She is oriented to person, place, and time. She appears well-developed and well-nourished. No distress.  HENT:  Head: Normocephalic and atraumatic.  Right Ear: Tympanic membrane, external ear and ear canal normal.  Left Ear: Tympanic membrane, external ear and ear canal  normal.  Nose: Mucosal edema and rhinorrhea present. Right sinus exhibits maxillary sinus tenderness (more tender than frontal) and frontal sinus tenderness. Left sinus exhibits maxillary sinus tenderness (more tender than frontal) and frontal sinus tenderness.  Mouth/Throat: Uvula is midline and mucous membranes are normal. Posterior oropharyngeal edema (cobblestoning appearance) and posterior oropharyngeal erythema present. No oropharyngeal exudate or tonsillar abscesses.  Crepitus of R TMJ noted w/ mouth open/closing.  Eyes: Conjunctivae and EOM are normal. Pupils are equal, round, and reactive to light. Right eye exhibits no discharge. Left eye exhibits no discharge.  Neck: Normal range of motion. Neck supple. No JVD present. No tracheal deviation present. No thyromegaly present.  Cardiovascular: Normal rate, regular rhythm, normal heart sounds and intact distal pulses.  No murmur heard. Pulmonary/Chest: Effort normal and breath sounds normal. No respiratory distress.  Lymphadenopathy:    She has cervical adenopathy.  Neurological: She is alert and oriented to person, place, and time. Gait normal.  Skin: Skin is warm and dry.  Psychiatric: She has a normal mood and affect. Her behavior is normal. Judgment and thought content normal.  Vitals reviewed.      Assessment & Plan:   Problem List Items Addressed This Visit    None    Visit Diagnoses    Acute  bacterial rhinosinusitis    -  Primary Consistent with URI and secondary sinusitis with symptoms worsening over the past 7 days and initial symptoms of nasal congestion and sinus pressure over 2 weeks ago w/ chronic allergic rhinits.   Plan: 1.START taking doxycycline 100 mg tablets every 12 hours for 10 days.  Discussed completing antibiotic. - While on antibiotic, take a probiotic OTC or from food. - Start tessalon 100 mg tablet twice daily prn cough. - Continue anti-histamine loratadine 10mg  daily. - Can use Flonase 2 sprays each  nostril daily for up to 4-6 weeks if no epistaxis. - Start Mucinex-DM OTC for  7-10 days prn congestion 2. Supportive care with nasal saline, warm herbal tea with honey, 3. Improve hydration 4. Tylenol / Motrin PRN fevers  5. Return criteria given   Relevant Medications   loratadine (CLARITIN) 10 MG tablet   doxycycline (VIBRA-TABS) 100 MG tablet   benzonatate (TESSALON) 100 MG capsule      Meds ordered this encounter  Medications  . triamterene-hydrochlorothiazide (MAXZIDE-25) 37.5-25 MG tablet    Sig: Take 1 tablet daily by mouth.    Refill:  12  . loratadine (CLARITIN) 10 MG tablet    Sig: Take 10 mg daily by mouth.      Follow up plan: Return in about 2 weeks (around 04/02/2017) for Physcial AND as needed in 5-7 days for URI symptoms not improving or worsening.  Cassell Smiles, DNP, AGPCNP-BC Adult Gerontology Primary Care Nurse Practitioner [facility] 03/19/2017, 1:36 PM

## 2017-03-19 NOTE — Telephone Encounter (Signed)
Called pt to sched for AWV with Nurse Health Advisor.  C/b #  [phone] on Skype [email or web address] if you have questions ° °

## 2017-03-19 NOTE — Patient Instructions (Addendum)
Michelle Fuentes, Thank you for coming in to clinic today.  1. It sounds like you have an Upper Respiratory Bacterial infection.  Recommend good hand washing. - START taking doxycycline 100 mg twice daily for 10 days.  Make sure to take all doses of your antibiotic. - While you are on an antibiotic, take a probiotic.  Antibiotics kill good and bad bacteria.  A probiotic helps to replace your good bacteria. Probiotic pills can be found over the counter.  One brand is Florastor, but you can use any brand you prefer.  You can also get good bacteria from foods like yogurt. - Drink plenty of fluids.  - Continue anti-histamine loratadine 10mg  daily. - You may also use Flonase 2 sprays each nostril daily for up to 4-6 weeks - Tessalon perles 100 mg  Take one tablet twice daily as needed for cough.  Other over the counter medications you may try, if needed for symptoms are: - If congestion is worse, start OTC Mucinex (or may try Mucinex-DM for cough) up to 7-10 days then stop - You may try over the counter Nasal Saline spray (Simply Saline, Ocean Spray) as needed to reduce congestion. - Start taking Tylenol extra strength 1 to 2 tablets every 6-8 hours for aches or fever/chills for next few days as needed.  Do not take more than 3,000 mg in 24 hours from all medicines.   - Drink warm herbal tea with honey for sore throat.   If symptoms are significantly worse with persistent fevers/chills despite tylenol/ibpurofen, nausea, vomiting unable to tolerate food/fluids or medicine, body aches, or shortness of breath, sinus pain pressure or worsening productive cough, then follow-up for re-evaluation, may seek more immediate care at Urgent Care or the ED if you are more concerned that it is an emergency.   Please schedule a follow-up appointment with Cassell Smiles, AGNP. Return in about 2 weeks (around 04/02/2017) for Physcial AND as needed in 5-7 days.  If you have any other questions or concerns, please feel free to  call the clinic or send a message through [place]. You may also schedule an earlier appointment if necessary.  You will receive a survey after today's visit either digitally by e-mail or paper by [employer]. Your experiences and feedback matter to [name].  Please respond so we know how we are doing as we provide care for you.   Cassell Smiles, DNP, AGNP-BC Adult Gerontology Nurse Practitioner [place]

## 2017-03-21 ENCOUNTER — Other Ambulatory Visit: Payer: Self-pay | Admitting: Nurse Practitioner

## 2017-03-26 ENCOUNTER — Other Ambulatory Visit: Payer: Medicare HMO

## 2017-03-26 ENCOUNTER — Ambulatory Visit (INDEPENDENT_AMBULATORY_CARE_PROVIDER_SITE_OTHER): Payer: Medicare HMO

## 2017-03-26 ENCOUNTER — Other Ambulatory Visit: Payer: Self-pay | Admitting: Nurse Practitioner

## 2017-03-26 VITALS — BP 118/64 | HR 74 | Resp 15 | Ht 65.0 in | Wt 160.4 lb

## 2017-03-26 DIAGNOSIS — Z862 Personal history of diseases of the blood and blood-forming organs and certain disorders involving the immune mechanism: Secondary | ICD-10-CM | POA: Diagnosis not present

## 2017-03-26 DIAGNOSIS — E785 Hyperlipidemia, unspecified: Secondary | ICD-10-CM

## 2017-03-26 DIAGNOSIS — Z0000 Encounter for general adult medical examination without abnormal findings: Secondary | ICD-10-CM | POA: Diagnosis not present

## 2017-03-26 DIAGNOSIS — J019 Acute sinusitis, unspecified: Principal | ICD-10-CM

## 2017-03-26 DIAGNOSIS — I6523 Occlusion and stenosis of bilateral carotid arteries: Secondary | ICD-10-CM

## 2017-03-26 DIAGNOSIS — E039 Hypothyroidism, unspecified: Secondary | ICD-10-CM | POA: Diagnosis not present

## 2017-03-26 DIAGNOSIS — Z1231 Encounter for screening mammogram for malignant neoplasm of breast: Secondary | ICD-10-CM | POA: Diagnosis not present

## 2017-03-26 DIAGNOSIS — Z1239 Encounter for other screening for malignant neoplasm of breast: Secondary | ICD-10-CM

## 2017-03-26 DIAGNOSIS — R7309 Other abnormal glucose: Secondary | ICD-10-CM

## 2017-03-26 DIAGNOSIS — B9689 Other specified bacterial agents as the cause of diseases classified elsewhere: Secondary | ICD-10-CM

## 2017-03-26 LAB — CBC WITH DIFFERENTIAL/PLATELET
Basophils Absolute: 49 cells/uL (ref 0–200)
Basophils Relative: 0.6 %
Eosinophils Absolute: 146 cells/uL (ref 15–500)
Eosinophils Relative: 1.8 %
HCT: 40.6 % (ref 35.0–45.0)
Hemoglobin: 13.8 g/dL (ref 11.7–15.5)
Lymphs Abs: 1571 cells/uL (ref 850–3900)
MCH: 27.8 pg (ref 27.0–33.0)
MCHC: 34 g/dL (ref 32.0–36.0)
MCV: 81.9 fL (ref 80.0–100.0)
MPV: 10.8 fL (ref 7.5–12.5)
Monocytes Relative: 9.3 %
Neutro Abs: 5581 cells/uL (ref 1500–7800)
Neutrophils Relative %: 68.9 %
Platelets: 437 10*3/uL — ABNORMAL HIGH (ref 140–400)
RBC: 4.96 10*6/uL (ref 3.80–5.10)
RDW: 12.8 % (ref 11.0–15.0)
Total Lymphocyte: 19.4 %
WBC mixed population: 753 cells/uL (ref 200–950)
WBC: 8.1 10*3/uL (ref 3.8–10.8)

## 2017-03-26 LAB — COMPREHENSIVE METABOLIC PANEL
AG Ratio: 1.7 (calc) (ref 1.0–2.5)
ALT: 14 U/L (ref 6–29)
AST: 13 U/L (ref 10–35)
Albumin: 4.1 g/dL (ref 3.6–5.1)
Alkaline phosphatase (APISO): 66 U/L (ref 33–130)
BUN: 16 mg/dL (ref 7–25)
CO2: 29 mmol/L (ref 20–32)
Calcium: 9.4 mg/dL (ref 8.6–10.4)
Chloride: 100 mmol/L (ref 98–110)
Creat: 0.71 mg/dL (ref 0.60–0.93)
Globulin: 2.4 g/dL (calc) (ref 1.9–3.7)
Glucose, Bld: 94 mg/dL (ref 65–99)
Potassium: 3.7 mmol/L (ref 3.5–5.3)
Sodium: 138 mmol/L (ref 135–146)
Total Bilirubin: 0.5 mg/dL (ref 0.2–1.2)
Total Protein: 6.5 g/dL (ref 6.1–8.1)

## 2017-03-26 LAB — LIPID PANEL
Cholesterol: 227 mg/dL — ABNORMAL HIGH (ref <200)
HDL: 65 mg/dL (ref >50)
LDL Cholesterol (Calc): 139 mg/dL (calc) — ABNORMAL HIGH
Non-HDL Cholesterol (Calc): 162 mg/dL (calc) — ABNORMAL HIGH (ref <130)
Total CHOL/HDL Ratio: 3.5 (calc) (ref <5.0)
Triglycerides: 111 mg/dL (ref <150)

## 2017-03-26 LAB — TSH: TSH: 1.24 mIU/L (ref 0.40–4.50)

## 2017-03-26 MED ORDER — BENZONATATE 100 MG PO CAPS: 100.0000 mg | ORAL_CAPSULE | Freq: Two times a day (BID) | ORAL | 0 refills | Status: DC | PRN

## 2017-03-26 NOTE — Progress Notes (Signed)
Subjective:   Michelle Fuentes is a 71 y.o. female who presents for Medicare Annual (Subsequent) preventive examination.  Review of Systems:   Cardiac Risk Factors include: hypertension;advanced age (>55men, >30 women);dyslipidemia     Objective:     Vitals: BP 118/64 (BP Location: Left Arm, Patient Position: Sitting)   Pulse 74   Resp 15   Ht 5\' 5"  (1.651 m)   Wt 160 lb 6.4 oz (72.8 kg)   BMI 26.69 kg/m   Body mass index is 26.69 kg/m.   Tobacco Social History   Tobacco Use  Smoking Status Never Smoker  Smokeless Tobacco Never Used     Counseling given: No   Past Medical History:  Diagnosis Date  . GERD (gastroesophageal reflux disease)   . History of TIA (transient ischemic attack)   . Hypothyroidism   . Iron deficiency anemia   . Meniere disease   . Mixed hyperlipidemia   . Osteopenia   . Seasonal allergies    Past Surgical History:  Procedure Laterality Date  . eye surgery    . mole removed    . TONSILLECTOMY     Family History  Problem Relation Age of Onset  . Hypertension Father   . Lymphoma Brother   . Breast cancer Maternal Grandmother 80  . Melanoma Brother   . Ovarian cancer Neg Hx   . Colon cancer Neg Hx   . Diabetes Neg Hx   . Heart disease Neg Hx    Social History   Substance and Sexual Activity  Sexual Activity Yes  . Birth control/protection: Post-menopausal    Outpatient Encounter Medications as of 03/26/2017  Medication Sig  . aspirin 81 MG tablet Take 81 mg by mouth daily.   . benzonatate (TESSALON) 100 MG capsule Take 1 capsule (100 mg total) 2 (two) times daily as needed by mouth for cough.  . diazepam (VALIUM) 2 MG tablet Take 2 mg by mouth every 6 (six) hours as needed.   . doxycycline (VIBRA-TABS) 100 MG tablet Take 1 tablet (100 mg total) 2 (two) times daily by mouth.  . fluticasone (FLONASE) 50 MCG/ACT nasal spray Place 2 sprays into both nostrils daily.  . Ginkgo Biloba Extract 60 MG CAPS Take 120 mg by mouth.  .  levothyroxine (SYNTHROID, LEVOTHROID) 88 MCG tablet Take 1 tablet (88 mcg total) by mouth daily before breakfast.  . loratadine (CLARITIN) 10 MG tablet Take 10 mg daily by mouth.  . Lutein 6 MG CAPS Take 10 mg by mouth daily.   . meclizine (ANTIVERT) 25 MG tablet Take 25 mg by mouth once as needed.   . ranitidine (ZANTAC) 150 MG tablet Take 150 mg by mouth.  . triamterene-hydrochlorothiazide (DYAZIDE) 37.5-25 MG per capsule Take 1 capsule by mouth daily.   . Cholecalciferol 1000 UNITS capsule Take 1,000 Units by mouth daily.   . Magnesium Gluconate 250 MG TABS Take 250 mg by mouth daily.   Marland Kitchen triamcinolone cream (KENALOG) 0.5 % Apply 1 application topically 2 (two) times daily. To affected areas, for up to 2 weeks. (Patient not taking: Reported on 03/19/2017)  . [DISCONTINUED] triamterene-hydrochlorothiazide (MAXZIDE-25) 37.5-25 MG tablet Take 1 tablet daily by mouth.   No facility-administered encounter medications on file as of 03/26/2017.     Activities of Daily Living In your present state of health, do you have any difficulty performing the following activities: 03/26/2017  Hearing? N  Comment difficulty with left ear - goes for checks  Vision? N  Comment  problem at night   Difficulty concentrating or making decisions? N  Walking or climbing stairs? Y  Comment SOB   Dressing or bathing? N  Doing errands, shopping? N  Preparing Food and eating ? N  Using the Toilet? N  In the past six months, have you accidently leaked urine? Y  Comment wears pads   Do you have problems with loss of bowel control? N  Managing your Medications? N  Managing your Finances? N  Housekeeping or managing your Housekeeping? N  Some recent data might be hidden    Patient Care Team: Mikey College, NP as PCP - General (Nurse Practitioner) Sanjuana Kava, MD as Referring Physician (Otolaryngology) Carloyn Manner, MD as Referring Physician (Otolaryngology)    Assessment:      Exercise Activities and Dietary recommendations Current Exercise Habits: The patient does not participate in regular exercise at present, Exercise limited by: None identified  Goals    . DIET - INCREASE WATER INTAKE     Recommend drinking at least 5-6 glasses of water a day      Fall Risk Fall Risk  03/26/2017 04/28/2015  Falls in the past year? No No   Depression Screen PHQ 2/9 Scores 03/26/2017 04/28/2015  PHQ - 2 Score 0 0     Cognitive Function        Immunization History  Administered Date(s) Administered  . Influenza, High Dose Seasonal PF 04/28/2015  . Influenza,inj,Quad PF,6+ Mos 01/12/2013   Screening Tests Health Maintenance  Topic Date Due  . PNA vac Low Risk Adult (1 of 2 - PCV13) 03/10/2011  . INFLUENZA VACCINE  12/05/2016  . Hepatitis C Screening  03/26/2018 (Originally 03-17-1946)  . MAMMOGRAM  02/21/2018  . TETANUS/TDAP  05/07/2021  . COLONOSCOPY  05/08/2023  . DEXA SCAN  Completed      Plan:     I have personally reviewed and addressed the Medicare Annual Wellness questionnaire and have noted the following in the patient's chart:  A. Medical and social history B. Use of alcohol, tobacco or illicit drugs  C. Current medications and supplements D. Functional ability and status E.  Nutritional status F.  Physical activity G. Advance directives H. List of other physicians I.  Hospitalizations, surgeries, and ER visits in previous 12 months J.  [place] such as hearing and vision if needed, cognitive and depression L. Referrals and appointments   In addition, I have reviewed and discussed with patient certain preventive protocols, quality metrics, and best practice recommendations. A written personalized care plan for preventive services as well as general preventive health recommendations were provided to patient.   Signed,  Tyler Aas, LPN Nurse Health Advisor    Nurse Notes: requesting refill on tessalon

## 2017-03-26 NOTE — Patient Instructions (Addendum)
[name] , Thank you for taking time to come for your Medicare Wellness Visit. I appreciate your ongoing commitment to your health goals. Please review the following plan we discussed and let me know if I can assist you in the future.   Screening recommendations/referrals: Colonoscopy: completed 05/07/2013 Mammogram: ordered today Please call [phone] to schedule your mammogram.  Bone Density: completed 02/22/2016 Recommended yearly ophthalmology/optometry visit for glaucoma screening and checkup Recommended yearly dental visit for hygiene and checkup  Vaccinations: Influenza vaccine: due now - declined due to illness today Pneumococcal vaccine: prevnar 13 due - declined due to illness today  Tdap vaccine: up to date Shingles vaccine: due, check with your insurance company for coverage   Advanced directives: Please bring a copy of your health care power of attorney and living will to the office at your convenience.  Conditions/risks identified: Recommend drinking at least 5-6 glasses of water a day  Next appointment: Follow up on 04/02/2017 at 3:20pm with Lissa Merlin. Follow up in one year for your annual wellness exam.    Preventive Care 65 Years and Older, Female Preventive care refers to lifestyle choices and visits with your health care provider that can promote health and wellness. What does preventive care include?  A yearly physical exam. This is also called an annual well check.  Dental exams once or twice a year.  Routine eye exams. Ask your health care provider how often you should have your eyes checked.  Personal lifestyle choices, including:  Daily care of your teeth and gums.  Regular physical activity.  Eating a healthy diet.  Avoiding tobacco and drug use.  Limiting alcohol use.  Practicing safe sex.  Taking low-dose aspirin every day.  Taking vitamin and mineral supplements as recommended by your health care provider. What happens during  an annual well check? The services and screenings done by your health care provider during your annual well check will depend on your age, overall health, lifestyle risk factors, and family history of disease. Counseling  Your health care provider may ask you questions about your:  Alcohol use.  Tobacco use.  Drug use.  Emotional well-being.  Home and relationship well-being.  Sexual activity.  Eating habits.  History of falls.  Memory and ability to understand (cognition).  Work and work Statistician.  Reproductive health. Screening  You may have the following tests or measurements:  Height, weight, and BMI.  Blood pressure.  Lipid and cholesterol levels. These may be checked every 5 years, or more frequently if you are over 50 years old.  Skin check.  Lung cancer screening. You may have this screening every year starting at age 42 if you have a 30-pack-year history of smoking and currently smoke or have quit within the past 15 years.  Fecal occult blood test (FOBT) of the stool. You may have this test every year starting at age 88.  Flexible sigmoidoscopy or colonoscopy. You may have a sigmoidoscopy every 5 years or a colonoscopy every 10 years starting at age 59.  Hepatitis C blood test.  Hepatitis B blood test.  Sexually transmitted disease (STD) testing.  Diabetes screening. This is done by checking your blood sugar (glucose) after you have not eaten for a while (fasting). You may have this done every 1-3 years.  Bone density scan. This is done to screen for osteoporosis. You may have this done starting at age 81.  Mammogram. This may be done every 1-2 years. Talk to your health care provider about  how often you should have regular mammograms. Talk with your health care provider about your test results, treatment options, and if necessary, the need for more tests. Vaccines  Your health care provider may recommend certain vaccines, such as:  Influenza  vaccine. This is recommended every year.  Tetanus, diphtheria, and acellular pertussis (Tdap, Td) vaccine. You may need a Td booster every 10 years.  Zoster vaccine. You may need this after age 45.  Pneumococcal 13-valent conjugate (PCV13) vaccine. One dose is recommended after age 72.  Pneumococcal polysaccharide (PPSV23) vaccine. One dose is recommended after age 62. Talk to your health care provider about which screenings and vaccines you need and how often you need them. This information is not intended to replace advice given to you by your health care provider. Make sure you discuss any questions you have with your health care provider. Document Released: 05/20/2015 Document Revised: 01/11/2016 Document Reviewed: 02/22/2015 Elsevier Interactive Patient Education  2017 [place] Prevention in the Home Falls can cause injuries. They can happen to people of all ages. There are many things you can do to make your home safe and to help prevent falls. What can I do on the outside of my home?  Regularly fix the edges of walkways and driveways and fix any cracks.  Remove anything that might make you trip as you walk through a door, such as a raised step or threshold.  Trim any bushes or trees on the path to your home.  Use bright outdoor lighting.  Clear any walking paths of anything that might make someone trip, such as rocks or tools.  Regularly check to see if handrails are loose or broken. Make sure that both sides of any steps have handrails.  Any raised decks and porches should have guardrails on the edges.  Have any leaves, snow, or ice cleared regularly.  Use sand or salt on walking paths during winter.  Clean up any spills in your garage right away. This includes oil or grease spills. What can I do in the bathroom?  Use night lights.  Install grab bars by the toilet and in the tub and shower. Do not use towel bars as grab bars.  Use non-skid mats or decals  in the tub or shower.  If you need to sit down in the shower, use a plastic, non-slip stool.  Keep the floor dry. Clean up any water that spills on the floor as soon as it happens.  Remove soap buildup in the tub or shower regularly.  Attach bath mats securely with double-sided non-slip rug tape.  Do not have throw rugs and other things on the floor that can make you trip. What can I do in the bedroom?  Use night lights.  Make sure that you have a light by your bed that is easy to reach.  Do not use any sheets or blankets that are too big for your bed. They should not hang down onto the floor.  Have a firm chair that has side arms. You can use this for support while you get dressed.  Do not have throw rugs and other things on the floor that can make you trip. What can I do in the kitchen?  Clean up any spills right away.  Avoid walking on wet floors.  Keep items that you use a lot in easy-to-reach places.  If you need to reach something above you, use a strong step stool that has a grab bar.  Keep  electrical cords out of the way.  Do not use floor polish or wax that makes floors slippery. If you must use wax, use non-skid floor wax.  Do not have throw rugs and other things on the floor that can make you trip. What can I do with my stairs?  Do not leave any items on the stairs.  Make sure that there are handrails on both sides of the stairs and use them. Fix handrails that are broken or loose. Make sure that handrails are as long as the stairways.  Check any carpeting to make sure that it is firmly attached to the stairs. Fix any carpet that is loose or worn.  Avoid having throw rugs at the top or bottom of the stairs. If you do have throw rugs, attach them to the floor with carpet tape.  Make sure that you have a light switch at the top of the stairs and the bottom of the stairs. If you do not have them, ask someone to add them for you. What else can I do to help  prevent falls?  Wear shoes that:  Do not have high heels.  Have rubber bottoms.  Are comfortable and fit you well.  Are closed at the toe. Do not wear sandals.  If you use a stepladder:  Make sure that it is fully opened. Do not climb a closed stepladder.  Make sure that both sides of the stepladder are locked into place.  Ask someone to hold it for you, if possible.  Clearly mark and make sure that you can see:  Any grab bars or handrails.  First and last steps.  Where the edge of each step is.  Use tools that help you move around (mobility aids) if they are needed. These include:  Canes.  Walkers.  Scooters.  Crutches.  Turn on the lights when you go into a dark area. Replace any light bulbs as soon as they burn out.  Set up your furniture so you have a clear path. Avoid moving your furniture around.  If any of your floors are uneven, fix them.  If there are any pets around you, be aware of where they are.  Review your medicines with your doctor. Some medicines can make you feel dizzy. This can increase your chance of falling. Ask your doctor what other things that you can do to help prevent falls. This information is not intended to replace advice given to you by your health care provider. Make sure you discuss any questions you have with your health care provider. Document Released: 02/17/2009 Document Revised: 09/29/2015 Document Reviewed: 05/28/2014 Elsevier Interactive Patient Education  2017 [employer].

## 2017-03-27 MED ORDER — LEVOTHYROXINE SODIUM 88 MCG PO TABS: 88.0000 ug | ORAL_TABLET | Freq: Every day | ORAL | 3 refills | Status: DC

## 2017-03-27 NOTE — Progress Notes (Signed)
Levothyroxine refill to continue same dose.

## 2017-04-02 ENCOUNTER — Encounter: Payer: PRIVATE HEALTH INSURANCE | Admitting: Nurse Practitioner

## 2017-04-11 ENCOUNTER — Other Ambulatory Visit: Payer: Self-pay

## 2017-04-11 ENCOUNTER — Ambulatory Visit (INDEPENDENT_AMBULATORY_CARE_PROVIDER_SITE_OTHER): Payer: Medicare HMO | Admitting: Nurse Practitioner

## 2017-04-11 ENCOUNTER — Encounter: Payer: Self-pay | Admitting: Nurse Practitioner

## 2017-04-11 VITALS — BP 126/66 | HR 85 | Temp 97.5°F | Ht 65.0 in | Wt 166.2 lb

## 2017-04-11 DIAGNOSIS — N8110 Cystocele, unspecified: Secondary | ICD-10-CM | POA: Diagnosis not present

## 2017-04-11 DIAGNOSIS — Z124 Encounter for screening for malignant neoplasm of cervix: Secondary | ICD-10-CM

## 2017-04-11 DIAGNOSIS — Z1231 Encounter for screening mammogram for malignant neoplasm of breast: Secondary | ICD-10-CM

## 2017-04-11 DIAGNOSIS — Z1239 Encounter for other screening for malignant neoplasm of breast: Secondary | ICD-10-CM

## 2017-04-11 DIAGNOSIS — Z23 Encounter for immunization: Secondary | ICD-10-CM

## 2017-04-11 NOTE — Progress Notes (Signed)
Subjective:    Patient ID: Michelle Fuentes, female    DOB: 06-15-1945, 71 y.o.   MRN: [id number]  Michelle Fuentes is a 71 y.o. female presenting on 04/11/2017 for Annual Exam Bladder Prolapse, cervical polyp, breast ca screening.  HPI  Bladder Prolapse/ Urinary incontinence  Leaks only occasionally w/ urge incontinence.  She has had discussions in past about treatment options, but declines surgical management. -She wears poise pads for incontinence management currently.  Patient verbalizes contentment with current management. - Pt has not been routinely performing kegel exercises in last 6 months.   Pt has history of cervical polyp Pt has had polypectomy in last 5 years.  Needs to have vaginal exam w/ visual inspection of cervix.  Pt requests this exam today for wellness followup.  Breast Cancer Screening Pt requests clinical breast exam today for screening of breast cancer.  Mammogram is also requested.   Social History   Tobacco Use  . Smoking status: Never Smoker  . Smokeless tobacco: Never Used  Substance Use Topics  . Alcohol use: Yes    Comment: rare  . Drug use: No    Review of Systems Per HPI unless specifically indicated above     Objective:    BP 126/66 (BP Location: Right Arm, Patient Position: Sitting, Cuff Size: Normal)   Pulse 85   Temp (!) 97.5 F (36.4 C) (Oral)   Ht 5\' 5"  (1.651 m)   Wt 166 lb 3.2 oz (75.4 kg)   BMI 27.66 kg/m   Wt Readings from Last 3 Encounters:  04/11/17 166 lb 3.2 oz (75.4 kg)  03/26/17 160 lb 6.4 oz (72.8 kg)  03/19/17 159 lb 6.4 oz (72.3 kg)    Physical Exam  General - overweight, well-appearing, NAD HEENT - Normocephalic, atraumatic, PERRL, EOMI, patent nares w/o congestion, oropharynx clear, MMM Neck - supple, non-tender, no LAD, no thyromegaly, no carotid bruit Heart - RRR, no murmurs heard Lungs - Clear throughout all lobes, no wheezing, crackles, or rhonchi. Normal work of breathing. Breast - Normal exam w/  symmetric breasts, no mass, no nipple discharge, no skin changes or tenderness.  Abdomen - soft, NTND, no masses, no hepatosplenomegaly, active bowel sounds GU - Normal external female genitalia without lesions or fusion. Vaginal canal without lesions. Bladder prolapse noted as grade 1. Grade 1 uterine prolapse. Normal appearing cervix without lesions or friability. Physiologic discharge on exam. Bimanual exam without adnexal masses, enlarged uterus, or cervical motion tenderness Extremeties - non-tender, no edema, cap refill < 2 seconds, peripheral pulses intact +2 bilaterally Skin - warm, dry, no rashes Neuro - awake, alert, oriented x3, intact muscle strength 5/5 bilaterally, intact distal sensation to light touch, normal coordination, normal gait Psych - Normal mood and affect, normal behavior   Results for orders placed or performed in visit on 03/26/17  CBC with Differential/Platelet  Result Value Ref Range   WBC 8.1 3.8 - 10.8 Thousand/uL   RBC 4.96 3.80 - 5.10 Million/uL   Hemoglobin 13.8 11.7 - 15.5 g/dL   HCT 40.6 35.0 - 45.0 %   MCV 81.9 80.0 - 100.0 fL   MCH 27.8 27.0 - 33.0 pg   MCHC 34.0 32.0 - 36.0 g/dL   RDW 12.8 11.0 - 15.0 %   Platelets 437 (H) 140 - 400 Thousand/uL   MPV 10.8 7.5 - 12.5 fL   Neutro Abs 5,581 1,500 - 7,800 cells/uL   Lymphs Abs 1,571 850 - 3,900 cells/uL   WBC mixed population  753 200 - 950 cells/uL   Eosinophils Absolute 146 15 - 500 cells/uL   Basophils Absolute 49 0 - 200 cells/uL   Neutrophils Relative % 68.9 %   Total Lymphocyte 19.4 %   Monocytes Relative 9.3 %   Eosinophils Relative 1.8 %   Basophils Relative 0.6 %  Comprehensive metabolic panel  Result Value Ref Range   Glucose, Bld 94 65 - 99 mg/dL   BUN 16 7 - 25 mg/dL   Creat 0.71 0.60 - 0.93 mg/dL   BUN/Creatinine Ratio NOT APPLICABLE 6 - 22 (calc)   Sodium 138 135 - 146 mmol/L   Potassium 3.7 3.5 - 5.3 mmol/L   Chloride 100 98 - 110 mmol/L   CO2 29 20 - 32 mmol/L   Calcium 9.4  8.6 - 10.4 mg/dL   Total Protein 6.5 6.1 - 8.1 g/dL   Albumin 4.1 3.6 - 5.1 g/dL   Globulin 2.4 1.9 - 3.7 g/dL (calc)   AG Ratio 1.7 1.0 - 2.5 (calc)   Total Bilirubin 0.5 0.2 - 1.2 mg/dL   Alkaline phosphatase (APISO) 66 33 - 130 U/L   AST 13 10 - 35 U/L   ALT 14 6 - 29 U/L  Lipid panel  Result Value Ref Range   Cholesterol 227 (H) <200 mg/dL   HDL 65 >50 mg/dL   Triglycerides 111 <150 mg/dL   LDL Cholesterol (Calc) 139 (H) mg/dL (calc)   Total CHOL/HDL Ratio 3.5 <5.0 (calc)   Non-HDL Cholesterol (Calc) 162 (H) <130 mg/dL (calc)  TSH  Result Value Ref Range   TSH 1.24 0.40 - 4.50 mIU/L      Assessment & Plan:   Problem List Items Addressed This Visit      Genitourinary   Bladder prolapse, female, acquired    Pt w/ grade 1 bladder prolapse contributing to urinary incontinence.  Pt currently using poise pads for management w/ inadequate use of kegel exercises.  Plan: 1. Encouraged resumption of kegel exercises. 2. Discussed bladder diary, double void as possible methods for conservative incontinence management. 3. Introduced pessary as possible management option.  Pt hesitant, but is willing to try the poise vaginal inserts she can purchase OTC.  Encouraged visit w/ GYN to discuss pessary if desired in future. 4. Followup as needed and in 1 year.       Other Visit Diagnoses    Screening breast examination    -  Primary Pt requiring clinical breast exam for breast cancer screening.  Exam normal without lumps or concerns for presence of cancer.  Plan: 1. Proceed w/ mammogram. Reviewed scheduling instructions.    Cervical cancer screening     Pt w/ prior cervical polyp.  Due for vaginal exam.  Plan 1. Vaginal speculum exam performed w/ normal findings of vagina, cervix, uterus, and adnexa. 2. Followup 1 year.    Needs flu shot     Pt age > 70 years.  Administer high dose fluzone vaccine today.   Relevant Orders   Flu vaccine HIGH DOSE PF (Fluzone High dose)  (Completed)        Follow up plan: Return in about 1 year (around 04/11/2018) for osteopenia, bladder prolapse.  A total of 30 minutes was spent face-to-face with this patient. Greater than 50% of this time was spent in counseling and coordination of care with the patient.   Cassell Smiles, DNP, AGPCNP-BC Adult Gerontology Primary Care Nurse Practitioner [facility] 04/16/2017, 8:46 AM

## 2017-04-11 NOTE — Patient Instructions (Addendum)
Michelle Fuentes, Thank you for coming in to clinic today.  1. For your bladder prolapse: - Continue your conservative management. - If leakage or urinary frequency worsens, consider a pessary with OB-GYN before surgical options. - Try the poise vaginal inserts if you would like.  2. Your vaginal and breast exams today are normal.  3. To prevent progression of osteopenia, continue regular exercise.  Also consider adding a calcium supplement of 600 mg once daily with your vitamin D.  Please schedule a follow-up appointment with Cassell Smiles, AGNP. Return in about 1 year (around 04/11/2018) for osteopenia, bladder prolapse.  If you have any other questions or concerns, please feel free to call the clinic or send a message through [place]. You may also schedule an earlier appointment if necessary.  You will receive a survey after today's visit either digitally by e-mail or paper by [employer]. Your experiences and feedback matter to [name].  Please respond so we know how we are doing as we provide care for you.   Cassell Smiles, DNP, AGNP-BC Adult Gerontology Nurse Practitioner [place]

## 2017-04-16 ENCOUNTER — Encounter: Payer: Self-pay | Admitting: Nurse Practitioner

## 2017-04-16 DIAGNOSIS — N8110 Cystocele, unspecified: Secondary | ICD-10-CM | POA: Insufficient documentation

## 2017-04-16 NOTE — Assessment & Plan Note (Signed)
Pt w/ grade 1 bladder prolapse contributing to urinary incontinence.  Pt currently using poise pads for management w/ inadequate use of kegel exercises.  Plan: 1. Encouraged resumption of kegel exercises. 2. Discussed bladder diary, double void as possible methods for conservative incontinence management. 3. Introduced pessary as possible management option.  Pt hesitant, but is willing to try the poise vaginal inserts she can purchase OTC.  Encouraged visit w/ GYN to discuss pessary if desired in future. 4. Followup as needed and in 1 year.

## 2017-05-14 ENCOUNTER — Other Ambulatory Visit: Payer: Self-pay

## 2017-05-14 ENCOUNTER — Ambulatory Visit
Admission: RE | Admit: 2017-05-14 | Discharge: 2017-05-14 | Disposition: A | Discharge status: Home / Self Care | Payer: Medicare HMO | Source: Ambulatory Visit | Attending: Nurse Practitioner | Admitting: Nurse Practitioner

## 2017-05-14 DIAGNOSIS — Z1239 Encounter for other screening for malignant neoplasm of breast: Secondary | ICD-10-CM

## 2017-05-14 DIAGNOSIS — Z1231 Encounter for screening mammogram for malignant neoplasm of breast: Secondary | ICD-10-CM | POA: Diagnosis not present

## 2017-05-24 DIAGNOSIS — H43812 Vitreous degeneration, left eye: Secondary | ICD-10-CM | POA: Diagnosis not present

## 2017-05-24 DIAGNOSIS — G43109 Migraine with aura, not intractable, without status migrainosus: Secondary | ICD-10-CM | POA: Diagnosis not present

## 2017-06-07 ENCOUNTER — Other Ambulatory Visit: Payer: Self-pay

## 2017-06-07 ENCOUNTER — Encounter: Payer: Self-pay | Admitting: Nurse Practitioner

## 2017-06-07 ENCOUNTER — Ambulatory Visit (INDEPENDENT_AMBULATORY_CARE_PROVIDER_SITE_OTHER): Payer: Medicare HMO | Admitting: Nurse Practitioner

## 2017-06-07 VITALS — BP 141/68 | HR 77 | Temp 98.0°F | Ht 65.0 in | Wt 156.0 lb

## 2017-06-07 DIAGNOSIS — H65192 Other acute nonsuppurative otitis media, left ear: Secondary | ICD-10-CM

## 2017-06-07 MED ORDER — AMOXICILLIN-POT CLAVULANATE 875-125 MG PO TABS: 1.0000 | ORAL_TABLET | Freq: Two times a day (BID) | ORAL | 0 refills | Status: AC

## 2017-06-07 MED ORDER — FLUCONAZOLE 150 MG PO TABS: 150.0000 mg | ORAL_TABLET | Freq: Once | ORAL | 0 refills | Status: AC

## 2017-06-07 NOTE — Patient Instructions (Addendum)
Michelle Fuentes, Thank you for coming in to clinic today.  1. You have early increased ear pressure and redness that appears to be an early ear infection. - START Tomorrow if continued symptoms. Augmentin 875-125 mg twice daily for 10 days   - Use your OTC medications for your cold: Mucinex, claritin, and flonase.  If you get a yeast infection, take one diflucan 150 mg tablet for one dose.  Please schedule a follow-up appointment with Cassell Smiles, AGNP. Return 5-7 if symptoms worsen or fail to improve.  If you have any other questions or concerns, please feel free to call the clinic or send a message through [place]. You may also schedule an earlier appointment if necessary.  You will receive a survey after today's visit either digitally by e-mail or paper by [employer]. Your experiences and feedback matter to [name].  Please respond so we know how we are doing as we provide care for you.   Cassell Smiles, DNP, AGNP-BC Adult Gerontology Nurse Practitioner [place]

## 2017-06-07 NOTE — Progress Notes (Signed)
Subjective:    Patient ID: Michelle Fuentes, female    DOB: March 20, 1946, 72 y.o.   MRN: [id number]  Michelle Fuentes is a 72 y.o. female presenting on 06/07/2017 for URI (symptoms started of with a sore throat Monday, constant nasal drainage and intermittent right ear discomfort )   HPI URI symptoms Onset symptoms Monday only with sore throat and worsened some over that day.  Sore throat resolved.  Tuesday has had rhinorrhea for several days.  Now left ear with increased pressure intermittently.  Has taken Tylenol for increased pressure last night when she woke up.  Denies throbbing pain, but only increased pressure.  Also some itching of ear canal. - no associated cough or dizziness, but occasionally does have intermittent difficulty with balance (likely 2/2 meniere's disease) - Pt denies fever, chills, sweats, nausea, vomiting, diarrhea and constipation.  Social History   Tobacco Use  . Smoking status: Never Smoker  . Smokeless tobacco: Never Used  Substance Use Topics  . Alcohol use: Yes    Comment: rare  . Drug use: No    Review of Systems Per HPI unless specifically indicated above     Objective:    BP (!) 141/68 (BP Location: Right Arm, Patient Position: Sitting, Cuff Size: Normal)   Pulse 77   Temp 98 F (36.7 C) (Oral)   Ht 5\' 5"  (1.651 m)   Wt 156 lb (70.8 kg)   BMI 25.96 kg/m   Wt Readings from Last 3 Encounters:  06/07/17 156 lb (70.8 kg)  04/11/17 166 lb 3.2 oz (75.4 kg)  03/26/17 160 lb 6.4 oz (72.8 kg)    Physical Exam  Constitutional: She is oriented to person, place, and time. She appears well-developed and well-nourished. No distress.  HENT:  Head: Normocephalic and atraumatic.  Right Ear: Hearing, tympanic membrane, external ear and ear canal normal. Tympanic membrane is not erythematous and not retracted.  Left Ear: Hearing, external ear and ear canal normal. Tympanic membrane is erythematous and retracted.  Nose: Mucosal edema and rhinorrhea  present. Right sinus exhibits maxillary sinus tenderness and frontal sinus tenderness. Left sinus exhibits maxillary sinus tenderness and frontal sinus tenderness.  Mouth/Throat: Uvula is midline and mucous membranes are normal. No posterior oropharyngeal edema (cobblestoning). Oropharyngeal exudate: clear secretions.  Neck: Normal range of motion. Neck supple.  Cardiovascular: Normal rate, regular rhythm, S1 normal, S2 normal, normal heart sounds and intact distal pulses.  Pulmonary/Chest: Effort normal and breath sounds normal. No respiratory distress.  Lymphadenopathy:    She has cervical adenopathy.  Neurological: She is alert and oriented to person, place, and time.  Skin: Skin is warm and dry.  Psychiatric: She has a normal mood and affect. Her behavior is normal.  Vitals reviewed.    Results for orders placed or performed in visit on 03/26/17  CBC with Differential/Platelet  Result Value Ref Range   WBC 8.1 3.8 - 10.8 Thousand/uL   RBC 4.96 3.80 - 5.10 Million/uL   Hemoglobin 13.8 11.7 - 15.5 g/dL   HCT 40.6 35.0 - 45.0 %   MCV 81.9 80.0 - 100.0 fL   MCH 27.8 27.0 - 33.0 pg   MCHC 34.0 32.0 - 36.0 g/dL   RDW 12.8 11.0 - 15.0 %   Platelets 437 (H) 140 - 400 Thousand/uL   MPV 10.8 7.5 - 12.5 fL   Neutro Abs 5,581 1,500 - 7,800 cells/uL   Lymphs Abs 1,571 850 - 3,900 cells/uL   WBC mixed population 753 200 -  950 cells/uL   Eosinophils Absolute 146 15 - 500 cells/uL   Basophils Absolute 49 0 - 200 cells/uL   Neutrophils Relative % 68.9 %   Total Lymphocyte 19.4 %   Monocytes Relative 9.3 %   Eosinophils Relative 1.8 %   Basophils Relative 0.6 %  Comprehensive metabolic panel  Result Value Ref Range   Glucose, Bld 94 65 - 99 mg/dL   BUN 16 7 - 25 mg/dL   Creat 0.71 0.60 - 0.93 mg/dL   BUN/Creatinine Ratio NOT APPLICABLE 6 - 22 (calc)   Sodium 138 135 - 146 mmol/L   Potassium 3.7 3.5 - 5.3 mmol/L   Chloride 100 98 - 110 mmol/L   CO2 29 20 - 32 mmol/L   Calcium 9.4 8.6 -  10.4 mg/dL   Total Protein 6.5 6.1 - 8.1 g/dL   Albumin 4.1 3.6 - 5.1 g/dL   Globulin 2.4 1.9 - 3.7 g/dL (calc)   AG Ratio 1.7 1.0 - 2.5 (calc)   Total Bilirubin 0.5 0.2 - 1.2 mg/dL   Alkaline phosphatase (APISO) 66 33 - 130 U/L   AST 13 10 - 35 U/L   ALT 14 6 - 29 U/L  Lipid panel  Result Value Ref Range   Cholesterol 227 (H) <200 mg/dL   HDL 65 >50 mg/dL   Triglycerides 111 <150 mg/dL   LDL Cholesterol (Calc) 139 (H) mg/dL (calc)   Total CHOL/HDL Ratio 3.5 <5.0 (calc)   Non-HDL Cholesterol (Calc) 162 (H) <130 mg/dL (calc)  TSH  Result Value Ref Range   TSH 1.24 0.40 - 4.50 mIU/L      Assessment & Plan:   Problem List Items Addressed This Visit    None    Visit Diagnoses    Other nonrecurrent acute nonsuppurative otitis media of left ear    -  Primary Consistent with early L AOM on exam without effusion or perforation, in setting of prior URI symptoms with multiple known sick contacts (URI).  No recent URI or antibiotics. No prior known AOM. Currently afebrile, well-appearing and non-toxic, well hydrated on exam.  Plan: 1. Start Augmentin 875-125 mg twice daily x 10 days.  Start on 06/08/2017 if symptoms persist or worsen overnight. - Continue anti-histamine Loratadine 10mg  daily,  - Continue Flonase 2 sprays each nostril daily for up to 4-6 weeks - Start Mucinex-DM OTC up to 7-10 days then stop - Start tessalon perles 100 mg every 12 hours as needed for cough. 2. Supportive care with nasal saline, warm herbal tea with honey, 3. Improve hydration 4. Tylenol / Motrin PRN fevers 5. Return criteria given   Relevant Medications   amoxicillin-clavulanate (AUGMENTIN) 875-125 MG tablet (Start on 06/08/2017)   fluconazole (DIFLUCAN) 150 MG tablet (Start on 06/08/2017)      Meds ordered this encounter  Medications  . amoxicillin-clavulanate (AUGMENTIN) 875-125 MG tablet    Sig: Take 1 tablet by mouth 2 (two) times daily for 10 days.    Dispense:  20 tablet    Refill:  0    Order  Specific Question:   Supervising Provider    Answer:   Olin Hauser [[id number]]  . fluconazole (DIFLUCAN) 150 MG tablet    Sig: Take 1 tablet (150 mg total) by mouth once for 1 dose.    Dispense:  1 tablet    Refill:  0    Order Specific Question:   Supervising Provider    Answer:   Olin Hauser [[id number]]  Follow up plan: Return 5-7 if symptoms worsen or fail to improve.    Cassell Smiles, DNP, AGPCNP-BC Adult Gerontology Primary Care Nurse Practitioner [facility] 06/07/2017, 3:52 PM

## 2017-07-08 DIAGNOSIS — H2513 Age-related nuclear cataract, bilateral: Secondary | ICD-10-CM | POA: Diagnosis not present

## 2017-07-15 ENCOUNTER — Telehealth: Payer: Self-pay | Admitting: Nurse Practitioner

## 2017-07-15 DIAGNOSIS — B9689 Other specified bacterial agents as the cause of diseases classified elsewhere: Secondary | ICD-10-CM

## 2017-07-15 DIAGNOSIS — J019 Acute sinusitis, unspecified: Principal | ICD-10-CM

## 2017-07-15 MED ORDER — BENZONATATE 100 MG PO CAPS: 100.0000 mg | ORAL_CAPSULE | Freq: Two times a day (BID) | ORAL | 0 refills | Status: DC | PRN

## 2017-07-15 NOTE — Telephone Encounter (Signed)
Pt said she started coughing on Thursday and asked if she could have pearls called in at [facility] in [place].  Her call back number is [phone]

## 2017-07-16 ENCOUNTER — Telehealth: Payer: Self-pay

## 2017-07-16 NOTE — Telephone Encounter (Signed)
The pt was notified that the [place] was sent over to her pharmacy. She verbalize understanding.

## 2017-07-19 ENCOUNTER — Encounter: Payer: Self-pay | Admitting: Nurse Practitioner

## 2017-07-19 ENCOUNTER — Ambulatory Visit (INDEPENDENT_AMBULATORY_CARE_PROVIDER_SITE_OTHER): Payer: Medicare HMO | Admitting: Nurse Practitioner

## 2017-07-19 ENCOUNTER — Other Ambulatory Visit: Payer: Self-pay

## 2017-07-19 VITALS — BP 125/70 | HR 84 | Temp 97.8°F | Resp 18 | Ht 65.0 in | Wt 149.6 lb

## 2017-07-19 DIAGNOSIS — J0101 Acute recurrent maxillary sinusitis: Secondary | ICD-10-CM

## 2017-07-19 MED ORDER — IPRATROPIUM BROMIDE 0.06 % NA SOLN: 2.0000 | Freq: Four times a day (QID) | NASAL | 0 refills | Status: DC

## 2017-07-19 MED ORDER — DOXYCYCLINE HYCLATE 100 MG PO TABS: 100.0000 mg | ORAL_TABLET | Freq: Two times a day (BID) | ORAL | 0 refills | Status: DC

## 2017-07-19 NOTE — Patient Instructions (Addendum)
Michelle Fuentes, Thank you for coming in to clinic today.  1. It sounds like you have an Upper Respiratory Bacterial infection.  Recommend good hand washing. - START taking doxycycline 100 mg twice daily for 10 days.  Make sure to take all doses of your antibiotic. - While you are on an antibiotic, take a probiotic.  Antibiotics kill good and bad bacteria.  A probiotic helps to replace your good bacteria. Probiotic pills can be found over the counter.  One brand is Florastor, but you can use any brand you prefer.  You can also get good bacteria from foods.  These foods are yogurt, kefir, kombucha, and fresh, refrigerated and uncooked sauerkraut. - Drink plenty of fluids.  - Start Atrovent nasal spray decongestant 2 sprays each nostril up to 4 times daily for 5-7 days  - Continue anti-histamine loratadine 10mg  daily. - You may also use Flonase 2 sprays each nostril daily for up to 4-6 weeks  Other over the counter medications you may try, if needed for symptoms are: - If congestion is worse, start OTC Mucinex (or may try Mucinex-DM for cough) up to 7-10 days then stop - You may try over the counter Nasal Saline spray (Simply Saline, Ocean Spray) as needed to reduce congestion. - Start taking Tylenol extra strength 1 to 2 tablets every 6-8 hours for aches or fever/chills for next few days as needed.  Do not take more than 3,000 mg in 24 hours from all medicines.  You may also take ibuprofen 200-400mg  every 8 hours as needed.   - Drink warm herbal tea with honey for sore throat.   If symptoms are significantly worse with persistent fevers/chills despite tylenol/ibpurofen, nausea, vomiting unable to tolerate food/fluids or medicine, body aches, or shortness of breath, sinus pain pressure or worsening productive cough, then follow-up for re-evaluation, may seek more immediate care at Urgent Care or the ED if you are more concerned that it is an emergency.  Please schedule a follow-up appointment with Cassell Smiles, AGNP. Return 5-7 days if symptoms worsen or fail to improve.  If you have any other questions or concerns, please feel free to call the clinic or send a message through [address]. You may also schedule an earlier appointment if necessary.  You will receive a survey after today's visit either digitally by e-mail or paper by [employer]. Your experiences and feedback matter to [name].  Please respond so we know how we are doing as we provide care for you.   Cassell Smiles, DNP, AGNP-BC Adult Gerontology Nurse Practitioner [place]

## 2017-07-19 NOTE — Progress Notes (Signed)
Subjective:    Patient ID: EMMORY SOLIVAN, female    DOB: Jan 10, 1946, 72 y.o.   MRN: [id number]  Michelle Fuentes is a 72 y.o. female presenting on 07/19/2017 for Ear Pain (discomfort in the right ear yesterday that subsided) and Cough (upper congestion, cough. Also right side rib pain that possibly associated from the cough.)   HPI URI symptoms and ear pain Patient reports having had URI symptoms more than 2 weeks ago.  Approximately 8 days ago, symptoms returned.  She originally took antibiotics with immediate improvement, but is now recurrent with head congestion.  She notes 5 days ago she had low-grade fever at 100.5 F.  No fever since Sunday.  Congestion symptoms have worsened some over the last 3 days.  Cough is now significantly worse and now her lower right ribs are aching.  Pain in the rib cage worsens some with movement.  Associated symptoms also include mild right ear discomfort is moving towards her cheek on the right side - Has resumed tessalon perles.  Mucinex have helped symptoms.   -Her husband is also battling URI symptoms.  Social History   Tobacco Use  . Smoking status: Never Smoker  . Smokeless tobacco: Never Used  Substance Use Topics  . Alcohol use: Yes    Comment: rare  . Drug use: No    Review of Systems Per HPI unless specifically indicated above     Objective:    BP 125/70 (BP Location: Right Arm, Patient Position: Sitting, Cuff Size: Normal)   Pulse 84   Temp 97.8 F (36.6 C) (Oral)   Resp 18   Ht 5\' 5"  (1.651 m)   Wt 149 lb 9.6 oz (67.9 kg)   SpO2 98%   BMI 24.89 kg/m   Wt Readings from Last 3 Encounters:  07/19/17 149 lb 9.6 oz (67.9 kg)  06/07/17 156 lb (70.8 kg)  04/11/17 166 lb 3.2 oz (75.4 kg)    Physical Exam  Constitutional: She appears well-developed and well-nourished. She appears distressed (mildly).  HENT:  Head: Normocephalic and atraumatic.  Right Ear: Hearing, external ear and ear canal normal. Tympanic membrane is  erythematous and bulging. No middle ear effusion. No decreased hearing is noted.  Left Ear: Hearing, tympanic membrane, external ear and ear canal normal.  Nose: Mucosal edema and rhinorrhea present. Right sinus exhibits maxillary sinus tenderness and frontal sinus tenderness. Left sinus exhibits maxillary sinus tenderness and frontal sinus tenderness.  Mouth/Throat: Uvula is midline and mucous membranes are normal. Posterior oropharyngeal erythema (Mildly injected) present. No posterior oropharyngeal edema. Oropharyngeal exudate: clear secretions.  Neck: Normal range of motion. Neck supple.  Cardiovascular: Normal rate, regular rhythm, S1 normal, S2 normal, normal heart sounds and intact distal pulses.  Pulmonary/Chest: Effort normal and breath sounds normal. No respiratory distress.  Lymphadenopathy:    She has cervical adenopathy.  Neurological: She is alert.  Skin: Skin is warm and dry.  Psychiatric: She has a normal mood and affect. Her behavior is normal.  Vitals reviewed.    Results for orders placed or performed in visit on 03/26/17  CBC with Differential/Platelet  Result Value Ref Range   WBC 8.1 3.8 - 10.8 Thousand/uL   RBC 4.96 3.80 - 5.10 Million/uL   Hemoglobin 13.8 11.7 - 15.5 g/dL   HCT 40.6 35.0 - 45.0 %   MCV 81.9 80.0 - 100.0 fL   MCH 27.8 27.0 - 33.0 pg   MCHC 34.0 32.0 - 36.0 g/dL   RDW 12.8  11.0 - 15.0 %   Platelets 437 (H) 140 - 400 Thousand/uL   MPV 10.8 7.5 - 12.5 fL   Neutro Abs 5,581 1,500 - 7,800 cells/uL   Lymphs Abs 1,571 850 - 3,900 cells/uL   WBC mixed population 753 200 - 950 cells/uL   Eosinophils Absolute 146 15 - 500 cells/uL   Basophils Absolute 49 0 - 200 cells/uL   Neutrophils Relative % 68.9 %   Total Lymphocyte 19.4 %   Monocytes Relative 9.3 %   Eosinophils Relative 1.8 %   Basophils Relative 0.6 %  Comprehensive metabolic panel  Result Value Ref Range   Glucose, Bld 94 65 - 99 mg/dL   BUN 16 7 - 25 mg/dL   Creat 0.71 0.60 - 0.93  mg/dL   BUN/Creatinine Ratio NOT APPLICABLE 6 - 22 (calc)   Sodium 138 135 - 146 mmol/L   Potassium 3.7 3.5 - 5.3 mmol/L   Chloride 100 98 - 110 mmol/L   CO2 29 20 - 32 mmol/L   Calcium 9.4 8.6 - 10.4 mg/dL   Total Protein 6.5 6.1 - 8.1 g/dL   Albumin 4.1 3.6 - 5.1 g/dL   Globulin 2.4 1.9 - 3.7 g/dL (calc)   AG Ratio 1.7 1.0 - 2.5 (calc)   Total Bilirubin 0.5 0.2 - 1.2 mg/dL   Alkaline phosphatase (APISO) 66 33 - 130 U/L   AST 13 10 - 35 U/L   ALT 14 6 - 29 U/L  Lipid panel  Result Value Ref Range   Cholesterol 227 (H) <200 mg/dL   HDL 65 >50 mg/dL   Triglycerides 111 <150 mg/dL   LDL Cholesterol (Calc) 139 (H) mg/dL (calc)   Total CHOL/HDL Ratio 3.5 <5.0 (calc)   Non-HDL Cholesterol (Calc) 162 (H) <130 mg/dL (calc)  TSH  Result Value Ref Range   TSH 1.24 0.40 - 4.50 mIU/L      Assessment & Plan:   Problem List Items Addressed This Visit    None    Visit Diagnoses    Acute recurrent maxillary sinusitis    -  Primary Consistent with URI and secondary sinusitis with symptoms worsening over the past 7 days and initial symptoms of nasal congestion and sinus pressure over 2 weeks ago.  Possible new right acute otitis media.  Plan: 1.START taking doxycycline 100 mg tablets every 12 hours for 10 days.  Discussed completing antibiotic. - While on antibiotic, take a probiotic OTC or from food. - Start Atrovent nasal spray decongestant 2 sprays each nostril up to 4 times daily for 5-7 days - Continue anti-histamine loratadine 10mg  daily. - Can use Flonase 2 sprays each nostril daily for up to 4-6 weeks if no epistaxis. - Start Mucinex-DM OTC for  7-10 days prn congestion 2. Supportive care with nasal saline, warm herbal tea with honey, 3. Improve hydration 4. Tylenol / Motrin PRN fevers  5. Return criteria given. Patient will need to consider ENT referral if any additional recurrent sinusitis.   Relevant Medications   doxycycline (VIBRA-TABS) 100 MG tablet   ipratropium  (ATROVENT) 0.06 % nasal spray      Meds ordered this encounter  Medications  . doxycycline (VIBRA-TABS) 100 MG tablet    Sig: Take 1 tablet (100 mg total) by mouth 2 (two) times daily.    Dispense:  20 tablet    Refill:  0    Order Specific Question:   Supervising Provider    Answer:   Olin Hauser [[id number]]  .  ipratropium (ATROVENT) 0.06 % nasal spray    Sig: Place 2 sprays into both nostrils 4 (four) times daily.    Dispense:  15 mL    Refill:  0    Order Specific Question:   Supervising Provider    Answer:   Olin Hauser [[id number]]      Follow up plan: Return 5-7 days if symptoms worsen or fail to improve.   Cassell Smiles, DNP, AGPCNP-BC Adult Gerontology Primary Care Nurse Practitioner [facility] 08/03/2017, 10:05 PM

## 2017-08-03 ENCOUNTER — Encounter: Payer: Self-pay | Admitting: Nurse Practitioner

## 2017-08-08 DIAGNOSIS — H2513 Age-related nuclear cataract, bilateral: Secondary | ICD-10-CM | POA: Diagnosis not present

## 2017-09-19 DIAGNOSIS — R69 Illness, unspecified: Secondary | ICD-10-CM | POA: Diagnosis not present

## 2017-10-21 ENCOUNTER — Other Ambulatory Visit: Payer: Self-pay | Admitting: Family Medicine

## 2017-10-21 DIAGNOSIS — J301 Allergic rhinitis due to pollen: Secondary | ICD-10-CM

## 2017-11-18 DIAGNOSIS — H903 Sensorineural hearing loss, bilateral: Secondary | ICD-10-CM | POA: Diagnosis not present

## 2017-11-18 DIAGNOSIS — H8102 Meniere's disease, left ear: Secondary | ICD-10-CM | POA: Diagnosis not present

## 2017-11-21 DIAGNOSIS — H8102 Meniere's disease, left ear: Secondary | ICD-10-CM | POA: Diagnosis not present

## 2017-12-03 DIAGNOSIS — H8102 Meniere's disease, left ear: Secondary | ICD-10-CM | POA: Diagnosis not present

## 2017-12-13 DIAGNOSIS — H903 Sensorineural hearing loss, bilateral: Secondary | ICD-10-CM | POA: Diagnosis not present

## 2018-01-10 DIAGNOSIS — H903 Sensorineural hearing loss, bilateral: Secondary | ICD-10-CM | POA: Diagnosis not present

## 2018-01-27 DIAGNOSIS — H8103 Meniere's disease, bilateral: Secondary | ICD-10-CM | POA: Diagnosis not present

## 2018-01-27 DIAGNOSIS — H903 Sensorineural hearing loss, bilateral: Secondary | ICD-10-CM | POA: Diagnosis not present

## 2018-02-20 DIAGNOSIS — G43109 Migraine with aura, not intractable, without status migrainosus: Secondary | ICD-10-CM | POA: Diagnosis not present

## 2018-02-24 DIAGNOSIS — H903 Sensorineural hearing loss, bilateral: Secondary | ICD-10-CM | POA: Diagnosis not present

## 2018-02-24 DIAGNOSIS — H8103 Meniere's disease, bilateral: Secondary | ICD-10-CM | POA: Diagnosis not present

## 2018-03-17 DIAGNOSIS — H8103 Meniere's disease, bilateral: Secondary | ICD-10-CM | POA: Diagnosis not present

## 2018-03-17 DIAGNOSIS — H903 Sensorineural hearing loss, bilateral: Secondary | ICD-10-CM | POA: Diagnosis not present

## 2018-03-24 ENCOUNTER — Other Ambulatory Visit: Payer: Self-pay | Admitting: Nurse Practitioner

## 2018-03-24 ENCOUNTER — Telehealth: Payer: Self-pay | Admitting: Nurse Practitioner

## 2018-03-24 DIAGNOSIS — E039 Hypothyroidism, unspecified: Secondary | ICD-10-CM

## 2018-03-25 NOTE — Telephone Encounter (Signed)
error 

## 2018-03-26 DIAGNOSIS — R69 Illness, unspecified: Secondary | ICD-10-CM | POA: Diagnosis not present

## 2018-04-14 DIAGNOSIS — H903 Sensorineural hearing loss, bilateral: Secondary | ICD-10-CM | POA: Diagnosis not present

## 2018-04-14 DIAGNOSIS — H8103 Meniere's disease, bilateral: Secondary | ICD-10-CM | POA: Diagnosis not present

## 2018-04-15 DIAGNOSIS — H1851 Endothelial corneal dystrophy: Secondary | ICD-10-CM | POA: Diagnosis not present

## 2018-04-15 DIAGNOSIS — H35372 Puckering of macula, left eye: Secondary | ICD-10-CM | POA: Diagnosis not present

## 2018-04-15 DIAGNOSIS — H2513 Age-related nuclear cataract, bilateral: Secondary | ICD-10-CM | POA: Diagnosis not present

## 2018-04-16 ENCOUNTER — Other Ambulatory Visit: Payer: Self-pay | Admitting: Nurse Practitioner

## 2018-04-16 DIAGNOSIS — I6523 Occlusion and stenosis of bilateral carotid arteries: Secondary | ICD-10-CM

## 2018-04-16 DIAGNOSIS — E785 Hyperlipidemia, unspecified: Secondary | ICD-10-CM

## 2018-04-16 DIAGNOSIS — D509 Iron deficiency anemia, unspecified: Secondary | ICD-10-CM

## 2018-04-16 DIAGNOSIS — E039 Hypothyroidism, unspecified: Secondary | ICD-10-CM

## 2018-04-16 DIAGNOSIS — K219 Gastro-esophageal reflux disease without esophagitis: Secondary | ICD-10-CM

## 2018-04-16 NOTE — Telephone Encounter (Signed)
Please call patient to schedule lab only visit for reassessment prior to refills.  Can send temporary supply once patient has appt scheduled.

## 2018-04-18 DIAGNOSIS — K219 Gastro-esophageal reflux disease without esophagitis: Secondary | ICD-10-CM | POA: Insufficient documentation

## 2018-04-18 NOTE — Telephone Encounter (Signed)
Patient made a lab appointment in 04/22/18 for all labs.  She is requesting a complete panel.  She also made a follow up with you on 04/25/18.  Please order labs

## 2018-04-22 ENCOUNTER — Other Ambulatory Visit: Payer: Medicare HMO

## 2018-04-22 DIAGNOSIS — E039 Hypothyroidism, unspecified: Secondary | ICD-10-CM | POA: Diagnosis not present

## 2018-04-22 DIAGNOSIS — D509 Iron deficiency anemia, unspecified: Secondary | ICD-10-CM | POA: Diagnosis not present

## 2018-04-22 DIAGNOSIS — E785 Hyperlipidemia, unspecified: Secondary | ICD-10-CM

## 2018-04-22 DIAGNOSIS — K219 Gastro-esophageal reflux disease without esophagitis: Secondary | ICD-10-CM | POA: Diagnosis not present

## 2018-04-23 ENCOUNTER — Other Ambulatory Visit: Payer: Self-pay | Admitting: Nurse Practitioner

## 2018-04-23 DIAGNOSIS — E039 Hypothyroidism, unspecified: Secondary | ICD-10-CM

## 2018-04-23 LAB — CBC WITH DIFFERENTIAL/PLATELET
Absolute Monocytes: 699 cells/uL (ref 200–950)
Basophils Absolute: 68 cells/uL (ref 0–200)
Basophils Relative: 0.9 %
Eosinophils Absolute: 266 cells/uL (ref 15–500)
Eosinophils Relative: 3.5 %
HCT: 41.4 % (ref 35.0–45.0)
Hemoglobin: 14 g/dL (ref 11.7–15.5)
Lymphs Abs: 1900 cells/uL (ref 850–3900)
MCH: 28.3 pg (ref 27.0–33.0)
MCHC: 33.8 g/dL (ref 32.0–36.0)
MCV: 83.6 fL (ref 80.0–100.0)
MPV: 10.7 fL (ref 7.5–12.5)
Monocytes Relative: 9.2 %
Neutro Abs: 4666 cells/uL (ref 1500–7800)
Neutrophils Relative %: 61.4 %
Platelets: 403 10*3/uL — ABNORMAL HIGH (ref 140–400)
RBC: 4.95 10*6/uL (ref 3.80–5.10)
RDW: 12.7 % (ref 11.0–15.0)
Total Lymphocyte: 25 %
WBC: 7.6 10*3/uL (ref 3.8–10.8)

## 2018-04-23 LAB — COMPLETE METABOLIC PANEL WITH GFR
AG Ratio: 2 (calc) (ref 1.0–2.5)
ALT: 11 U/L (ref 6–29)
AST: 13 U/L (ref 10–35)
Albumin: 4.2 g/dL (ref 3.6–5.1)
Alkaline phosphatase (APISO): 58 U/L (ref 33–130)
BUN: 21 mg/dL (ref 7–25)
CO2: 28 mmol/L (ref 20–32)
Calcium: 9.8 mg/dL (ref 8.6–10.4)
Chloride: 100 mmol/L (ref 98–110)
Creat: 0.78 mg/dL (ref 0.60–0.93)
GFR, Est African American: 88 mL/min/{1.73_m2} (ref >=60)
GFR, Est Non African American: 76 mL/min/{1.73_m2} (ref >=60)
Globulin: 2.1 g/dL (calc) (ref 1.9–3.7)
Glucose, Bld: 77 mg/dL (ref 65–99)
Potassium: 4 mmol/L (ref 3.5–5.3)
Sodium: 139 mmol/L (ref 135–146)
Total Bilirubin: 0.4 mg/dL (ref 0.2–1.2)
Total Protein: 6.3 g/dL (ref 6.1–8.1)

## 2018-04-23 LAB — LIPID PANEL
Cholesterol: 245 mg/dL — ABNORMAL HIGH (ref <200)
HDL: 61 mg/dL (ref >50)
LDL Cholesterol (Calc): 156 mg/dL (calc) — ABNORMAL HIGH
Non-HDL Cholesterol (Calc): 184 mg/dL (calc) — ABNORMAL HIGH (ref <130)
Total CHOL/HDL Ratio: 4 (calc) (ref <5.0)
Triglycerides: 150 mg/dL — ABNORMAL HIGH (ref <150)

## 2018-04-23 LAB — TSH: TSH: 1.51 mIU/L (ref 0.40–4.50)

## 2018-04-23 MED ORDER — LEVOTHYROXINE SODIUM 88 MCG PO TABS: ORAL_TABLET | ORAL | 3 refills | Status: DC

## 2018-04-25 ENCOUNTER — Ambulatory Visit: Payer: Self-pay | Admitting: Nurse Practitioner

## 2018-04-28 ENCOUNTER — Other Ambulatory Visit: Payer: Self-pay

## 2018-04-28 ENCOUNTER — Encounter: Payer: Self-pay | Admitting: Nurse Practitioner

## 2018-04-28 ENCOUNTER — Ambulatory Visit (INDEPENDENT_AMBULATORY_CARE_PROVIDER_SITE_OTHER): Payer: Medicare HMO | Admitting: Nurse Practitioner

## 2018-04-28 VITALS — BP 131/58 | HR 74 | Temp 97.7°F | Ht 65.0 in | Wt 159.4 lb

## 2018-04-28 DIAGNOSIS — H8103 Meniere's disease, bilateral: Secondary | ICD-10-CM | POA: Diagnosis not present

## 2018-04-28 DIAGNOSIS — M791 Myalgia, unspecified site: Secondary | ICD-10-CM | POA: Insufficient documentation

## 2018-04-28 DIAGNOSIS — M8580 Other specified disorders of bone density and structure, unspecified site: Secondary | ICD-10-CM

## 2018-04-28 DIAGNOSIS — Z1239 Encounter for other screening for malignant neoplasm of breast: Secondary | ICD-10-CM

## 2018-04-28 DIAGNOSIS — E782 Mixed hyperlipidemia: Secondary | ICD-10-CM

## 2018-04-28 DIAGNOSIS — E039 Hypothyroidism, unspecified: Secondary | ICD-10-CM | POA: Diagnosis not present

## 2018-04-28 DIAGNOSIS — Z23 Encounter for immunization: Secondary | ICD-10-CM

## 2018-04-28 DIAGNOSIS — T466X5A Adverse effect of antihyperlipidemic and antiarteriosclerotic drugs, initial encounter: Secondary | ICD-10-CM | POA: Insufficient documentation

## 2018-04-28 NOTE — Patient Instructions (Addendum)
Michelle Fuentes,   Thank you for coming in to clinic today.  1. Get back to eating fewer sweets and carbohydrates. 2. Resume some physical activity for about 2-3 days per week.    3. Continue your current dose of levothyroxine.  Please schedule a follow-up appointment with Cassell Smiles, AGNP. Return in about 6 months (around 10/28/2018) for cholesterol.  If you have any other questions or concerns, please feel free to call the clinic or send a message through [place]. You may also schedule an earlier appointment if necessary.  You will receive a survey after today's visit either digitally by e-mail or paper by [employer]. Your experiences and feedback matter to [name].  Please respond so we know how we are doing as we provide care for you.   Cassell Smiles, DNP, AGNP-BC Adult Gerontology Nurse Practitioner [place] (Flu) Vaccine (Inactivated or Recombinant): What You Need to Know 1. Why get vaccinated? Influenza vaccine can prevent influenza (flu). Flu is a contagious disease that spreads around the [place] every year, usually between October and May. Anyone can get the flu, but it is more dangerous for some people. Infants and young children, people 59 years of age and older, pregnant women, and people with certain health conditions or a weakened immune system are at greatest risk of flu complications. Pneumonia, bronchitis, sinus infections and ear infections are examples of flu-related complications. If you have a medical condition, such as heart disease, cancer or diabetes, flu can make it worse. Flu can cause fever and chills, sore throat, muscle aches, fatigue, cough, headache, and runny or stuffy nose. Some people may have vomiting and diarrhea, though this is more common in children than adults. Each year thousands of people in the [place] die from flu, and many more are hospitalized. Flu vaccine prevents millions of illnesses and flu-related  visits to the doctor each year. 2. Influenza vaccine CDC recommends everyone 17 months of age and older get vaccinated every flu season. Children 6 months through 34 years of age may need 2 doses during a single flu season. Everyone else needs only 1 dose each flu season. It takes about 2 weeks for protection to develop after vaccination. There are many flu viruses, and they are always changing. Each year a new flu vaccine is made to protect against three or four viruses that are likely to cause disease in the upcoming flu season. Even when the vaccine doesn't exactly match these viruses, it may still provide some protection. Influenza vaccine does not cause flu. Influenza vaccine may be given at the same time as other vaccines. 3. Talk with your health care provider Tell your vaccine provider if the person getting the vaccine:  Has had an allergic reaction after a previous dose of influenza vaccine, or has any severe, life-threatening allergies.  Has ever had Guillain-Barr Syndrome (also called GBS). In some cases, your health care provider may decide to postpone influenza vaccination to a future visit. People with minor illnesses, such as a cold, may be vaccinated. People who are moderately or severely ill should usually wait until they recover before getting influenza vaccine. Your health care provider can give you more information. 4. Risks of a vaccine reaction  Soreness, redness, and swelling where shot is given, fever, muscle aches, and headache can happen after influenza vaccine.  There may be a very small increased risk of Guillain-Barr Syndrome (GBS) after inactivated influenza vaccine (the flu shot). Young children who get the  flu shot along with pneumococcal vaccine (PCV13), and/or DTaP vaccine at the same time might be slightly more likely to have a seizure caused by fever. Tell your health care provider if a child who is getting flu vaccine has ever had a seizure. People sometimes  faint after medical procedures, including vaccination. Tell your provider if you feel dizzy or have vision changes or ringing in the ears. As with any medicine, there is a very remote chance of a vaccine causing a severe allergic reaction, other serious injury, or death. 5. What if there is a serious problem? An allergic reaction could occur after the vaccinated person leaves the clinic. If you see signs of a severe allergic reaction (hives, swelling of the face and throat, difficulty breathing, a fast heartbeat, dizziness, or weakness), call 9-1-1 and get the person to the nearest hospital. For other signs that concern you, call your health care provider. Adverse reactions should be reported to the Vaccine Adverse Event Reporting System (VAERS). Your health care provider will usually file this report, or you can do it yourself. Visit the VAERS website at www.vaers.SamedayNews.es or call 302-721-5287.VAERS is only for reporting reactions, and VAERS staff do not give medical advice. 6. The National Vaccine Injury Compensation Program The [employer] Vaccine Injury Compensation Program (VICP) is a federal program that was created to compensate people who may have been injured by certain vaccines. Visit the VICP website at GoldCloset.com.ee or call 559-131-4788 to learn about the program and about filing a claim. There is a time limit to file a claim for compensation. 7. How can I learn more?  Ask your healthcare provider.  Call your local or state health department.  Contact the Centers for Disease Control and Prevention (CDC): ? Call 640-633-8778 (1-800-CDC-INFO) or ? Visit CDC's https://gibson.com/ Vaccine Information Statement (Interim) Inactivated Influenza Vaccine (12/19/2017) This information is not intended to replace advice given to you by your health care provider. Make sure you discuss any questions you have with your health care provider. Document Released: 02/15/2006 Document  Revised: 12/23/2017 Document Reviewed: 12/23/2017 Elsevier Interactive Patient Education  2019 [employer].

## 2018-04-28 NOTE — Progress Notes (Signed)
Subjective:    Patient ID: Michelle Fuentes, female    DOB: 10-11-1945, 72 y.o.   MRN: [id number]  Michelle Fuentes is a 72 y.o. female presenting on 04/28/2018 for Hypothyroidism (f/u lab results)   HPI Hypothyroidism - Pt states Michelle Fuentes is taking her levothyroxine 73mcg in the am at least 1 hour before eating or drinking and taking other medicines.   - Michelle Fuentes is not currently symptomatic. - Michelle Fuentes denies, fatigue, excess energy, weight changes, heart racing, heart palpitations, heat and cold intolerance, changes in hair/skin/nails, and lower leg swelling.  - Michelle Fuentes does not have any compressive symptoms to include difficulty swallowing, globus sensation, or difficulty breathing when lying flat.   Cholesterol Has had more sweets recently than usual.  Has had statin intolerance in past and does not wish to start these again. - Patient received a large gift basket,  - Exercise also has reduced from in past. - Usually does elliptical or bike at the gym.    Patient also wants yearly full head to toe exam with breast exam and GYN exam for screenings.  No complaints today.  Social History   Tobacco Use  . Smoking status: Never Smoker  . Smokeless tobacco: Never Used  Substance Use Topics  . Alcohol use: Yes    Comment: rare  . Drug use: No    Review of Systems Per HPI unless specifically indicated above     Objective:    BP (!) 131/58 (BP Location: Right Arm, Patient Position: Sitting, Cuff Size: Normal)   Pulse 74   Temp 97.7 F (36.5 C) (Oral)   Ht 5\' 5"  (1.651 m)   Wt 159 lb 6.4 oz (72.3 kg)   BMI 26.53 kg/m   Wt Readings from Last 3 Encounters:  04/28/18 159 lb 6.4 oz (72.3 kg)  07/19/17 149 lb 9.6 oz (67.9 kg)  06/07/17 156 lb (70.8 kg)    Physical Exam Vitals signs and nursing note reviewed.  Constitutional:      General: Michelle Fuentes is not in acute distress.    Appearance: Michelle Fuentes is well-developed.  HENT:     Head: Normocephalic and atraumatic.     Right Ear: Tympanic membrane,  ear canal and external ear normal.     Left Ear: Tympanic membrane, ear canal and external ear normal.     Nose: Nose normal.  Eyes:     Conjunctiva/sclera: Conjunctivae normal.     Pupils: Pupils are equal, round, and reactive to light.  Neck:     Musculoskeletal: Normal range of motion and neck supple.     Thyroid: No thyromegaly.     Vascular: No JVD.     Trachea: No tracheal deviation.  Cardiovascular:     Rate and Rhythm: Normal rate and regular rhythm.     Heart sounds: Normal heart sounds. No murmur. No friction rub. No gallop.   Pulmonary:     Effort: Pulmonary effort is normal. No respiratory distress.     Breath sounds: Normal breath sounds.  Chest:     Comments: Breast - Normal exam w/ symmetric breasts, no mass, no nipple discharge, no skin changes or tenderness.  Abdominal:     General: Bowel sounds are normal. There is no distension.     Palpations: Abdomen is soft.     Tenderness: There is no abdominal tenderness. There is no right CVA tenderness, left CVA tenderness or guarding.     Hernia: There is no hernia in the right inguinal area or  left inguinal area.  Genitourinary:    Exam position: Supine.     Pubic Area: No rash.      Labia:        Right: No rash, tenderness, lesion or injury.        Left: No rash, tenderness, lesion or injury.      Urethra: No prolapse, urethral pain, urethral swelling or urethral lesion.     Vagina: Normal.     Uterus: Normal.      Adnexa: Right adnexa normal and left adnexa normal.       Right: No mass, tenderness or fullness.         Left: No mass, tenderness or fullness.       Rectum: No external hemorrhoid or internal hemorrhoid. Normal anal tone.  Musculoskeletal: Normal range of motion.  Lymphadenopathy:     Cervical: No cervical adenopathy.     Lower Body: No right inguinal adenopathy. No left inguinal adenopathy.  Skin:    General: Skin is warm and dry.     Capillary Refill: Capillary refill takes less than 2 seconds.    Neurological:     General: No focal deficit present.     Mental Status: Michelle Fuentes is alert and oriented to person, place, and time. Mental status is at baseline.     Cranial Nerves: No cranial nerve deficit.  Psychiatric:        Mood and Affect: Mood normal.        Behavior: Behavior normal.        Thought Content: Thought content normal.        Judgment: Judgment normal.    Results for orders placed or performed in visit on 04/22/18  CBC with Differential/Platelet  Result Value Ref Range   WBC 7.6 3.8 - 10.8 Thousand/uL   RBC 4.95 3.80 - 5.10 Million/uL   Hemoglobin 14.0 11.7 - 15.5 g/dL   HCT 41.4 35.0 - 45.0 %   MCV 83.6 80.0 - 100.0 fL   MCH 28.3 27.0 - 33.0 pg   MCHC 33.8 32.0 - 36.0 g/dL   RDW 12.7 11.0 - 15.0 %   Platelets 403 (H) 140 - 400 Thousand/uL   MPV 10.7 7.5 - 12.5 fL   Neutro Abs 4,666 1,500 - 7,800 cells/uL   Lymphs Abs 1,900 850 - 3,900 cells/uL   Absolute Monocytes 699 200 - 950 cells/uL   Eosinophils Absolute 266 15 - 500 cells/uL   Basophils Absolute 68 0 - 200 cells/uL   Neutrophils Relative % 61.4 %   Total Lymphocyte 25.0 %   Monocytes Relative 9.2 %   Eosinophils Relative 3.5 %   Basophils Relative 0.9 %  TSH  Result Value Ref Range   TSH 1.51 0.40 - 4.50 mIU/L  Lipid panel  Result Value Ref Range   Cholesterol 245 (H) <200 mg/dL   HDL 61 >50 mg/dL   Triglycerides 150 (H) <150 mg/dL   LDL Cholesterol (Calc) 156 (H) mg/dL (calc)   Total CHOL/HDL Ratio 4.0 <5.0 (calc)   Non-HDL Cholesterol (Calc) 184 (H) <130 mg/dL (calc)  COMPLETE METABOLIC PANEL WITH GFR  Result Value Ref Range   Glucose, Bld 77 65 - 99 mg/dL   BUN 21 7 - 25 mg/dL   Creat 0.78 0.60 - 0.93 mg/dL   GFR, Est Non African American 76 > OR = 60 mL/min/1.15m2   GFR, Est African American 88 > OR = 60 mL/min/1.32m2   BUN/Creatinine Ratio NOT APPLICABLE 6 - 22 (calc)  Sodium 139 135 - 146 mmol/L   Potassium 4.0 3.5 - 5.3 mmol/L   Chloride 100 98 - 110 mmol/L   CO2 28 20 - 32 mmol/L    Calcium 9.8 8.6 - 10.4 mg/dL   Total Protein 6.3 6.1 - 8.1 g/dL   Albumin 4.2 3.6 - 5.1 g/dL   Globulin 2.1 1.9 - 3.7 g/dL (calc)   AG Ratio 2.0 1.0 - 2.5 (calc)   Total Bilirubin 0.4 0.2 - 1.2 mg/dL   Alkaline phosphatase (APISO) 58 33 - 130 U/L   AST 13 10 - 35 U/L   ALT 11 6 - 29 U/L      Assessment & Plan:   Problem List Items Addressed This Visit      Endocrine   Adult hypothyroidism Stable today on exam.  Medications tolerated without side effects.  Continue at current doses.  Refills provided.  Labs prior to visit in normal range.. Followup 6 months.      Nervous and Auditory   Meniere's disease Recurrence currently managed by [facility].  Undergoing injections with some improvement.  No significant dizziness at this time.  Follow-up prn.     Musculoskeletal and Integument   Osteopenia No recent evaluation.  Needs recheck of DEXA 2 years after last.  Ordered today.  Patient requested to schedule.   Relevant Orders   DG Bone Density    Other Visit Diagnoses    Breast cancer screening    -  Primary Pt last mammogram Birads1 one year ago.  Plan: 1. Screening mammogram order placed.  Pt will call to schedule appointment.  Information given.    Relevant Orders   MM DIGITAL SCREENING BILATERAL   Flu vaccine need     Pt > age 61.  Needs annual influenza vaccine.  Plan: 1. Administer high dose fluzone today.   Relevant Orders   Flu vaccine HIGH DOSE PF (Completed)   Mixed hyperlipidemia    Myalgia due to statin  Worsening today on exam.  Patient not currently taking  Medications due to statin intolerance.  Continue work on lifestyle management - focus on eating and adding physical activity.  Labs reviewed from prior to visit labs.. Followup 6 months.   Relevant Orders   Flu vaccine HIGH DOSE PF (Completed)      Follow up plan: Return in about 6 months (around 10/28/2018) for cholesterol.  Cassell Smiles, DNP, AGPCNP-BC Adult Gerontology Primary Care Nurse  Practitioner [facility] 04/28/2018, 10:54 AM

## 2018-05-12 ENCOUNTER — Telehealth: Payer: Self-pay | Admitting: Nurse Practitioner

## 2018-05-12 NOTE — Telephone Encounter (Signed)
Pt needs a mammo and bone density.

## 2018-05-12 NOTE — Telephone Encounter (Signed)
These orders have already been placed.  Patient needs to call and schedule them at the breast center.

## 2018-05-13 NOTE — Telephone Encounter (Signed)
The pt was notified and contact information given.

## 2018-07-16 ENCOUNTER — Other Ambulatory Visit: Payer: Self-pay

## 2018-07-16 ENCOUNTER — Ambulatory Visit
Admission: RE | Admit: 2018-07-16 | Discharge: 2018-07-16 | Disposition: A | Discharge status: Home / Self Care | Payer: Medicare HMO | Source: Ambulatory Visit | Attending: Nurse Practitioner | Admitting: Nurse Practitioner

## 2018-07-16 ENCOUNTER — Encounter (INDEPENDENT_AMBULATORY_CARE_PROVIDER_SITE_OTHER): Payer: Self-pay

## 2018-07-16 DIAGNOSIS — M8588 Other specified disorders of bone density and structure, other site: Secondary | ICD-10-CM | POA: Diagnosis not present

## 2018-07-16 DIAGNOSIS — M8589 Other specified disorders of bone density and structure, multiple sites: Secondary | ICD-10-CM | POA: Diagnosis not present

## 2018-07-16 DIAGNOSIS — Z1239 Encounter for other screening for malignant neoplasm of breast: Secondary | ICD-10-CM | POA: Diagnosis present

## 2018-07-16 DIAGNOSIS — Z1231 Encounter for screening mammogram for malignant neoplasm of breast: Secondary | ICD-10-CM | POA: Diagnosis not present

## 2018-07-16 DIAGNOSIS — M85851 Other specified disorders of bone density and structure, right thigh: Secondary | ICD-10-CM | POA: Diagnosis not present

## 2018-07-16 DIAGNOSIS — Z780 Asymptomatic menopausal state: Secondary | ICD-10-CM | POA: Diagnosis not present

## 2018-07-16 DIAGNOSIS — M8580 Other specified disorders of bone density and structure, unspecified site: Secondary | ICD-10-CM

## 2018-07-17 DIAGNOSIS — H903 Sensorineural hearing loss, bilateral: Secondary | ICD-10-CM | POA: Diagnosis not present

## 2018-10-15 ENCOUNTER — Other Ambulatory Visit: Payer: Self-pay | Admitting: Family Medicine

## 2018-10-15 DIAGNOSIS — J301 Allergic rhinitis due to pollen: Secondary | ICD-10-CM

## 2018-11-17 DIAGNOSIS — H8103 Meniere's disease, bilateral: Secondary | ICD-10-CM | POA: Diagnosis not present

## 2018-11-17 DIAGNOSIS — H903 Sensorineural hearing loss, bilateral: Secondary | ICD-10-CM | POA: Diagnosis not present

## 2018-12-04 DIAGNOSIS — R69 Illness, unspecified: Secondary | ICD-10-CM | POA: Diagnosis not present

## 2019-01-27 DIAGNOSIS — H8101 Meniere's disease, right ear: Secondary | ICD-10-CM | POA: Diagnosis not present

## 2019-03-11 ENCOUNTER — Encounter: Payer: Self-pay | Admitting: Family Medicine

## 2019-03-11 ENCOUNTER — Ambulatory Visit (INDEPENDENT_AMBULATORY_CARE_PROVIDER_SITE_OTHER): Payer: Medicare HMO | Admitting: Family Medicine

## 2019-03-11 ENCOUNTER — Other Ambulatory Visit: Payer: Self-pay

## 2019-03-11 VITALS — BP 126/80 | HR 93 | Temp 98.4°F | Resp 16 | Ht 65.0 in | Wt 159.0 lb

## 2019-03-11 DIAGNOSIS — S46912A Strain of unspecified muscle, fascia and tendon at shoulder and upper arm level, left arm, initial encounter: Secondary | ICD-10-CM | POA: Diagnosis not present

## 2019-03-11 DIAGNOSIS — Z23 Encounter for immunization: Secondary | ICD-10-CM | POA: Diagnosis not present

## 2019-03-11 DIAGNOSIS — D1723 Benign lipomatous neoplasm of skin and subcutaneous tissue of right leg: Secondary | ICD-10-CM | POA: Diagnosis not present

## 2019-03-11 NOTE — Patient Instructions (Addendum)
Thank you for coming to the office today.  Right Thigh bump - seems to be a lipoma, this is benign and unlikely to cause any harm. Possibly related to any contusion or bruise in the past with a scar tissue or hematoma underneath can trigger these.  Left shoulder has some trigger point or muscle knot spasm, that cause your pain. I am not worried about other problems related to this pain, it should improve with rest, time and heat / massage / muscle rub.  If interested in a mild muscle relaxant contact office, we can offer Baclofen medicine as needed.  Tylenol is fine as well.  Flu shot today  Please schedule a Follow-up Appointment to: Return if symptoms worsen or fail to improve.  If you have any other questions or concerns, please feel free to call the office or send a message through [facility]. You may also schedule an earlier appointment if necessary.  Additionally, you may be receiving a survey about your experience at our office within a few days to 1 week by e-mail or mail. We value your feedback.  Nobie Putnam, DO [place]

## 2019-03-11 NOTE — Progress Notes (Signed)
Subjective:    Patient ID: Michelle Fuentes, female    DOB: 05-Jun-1945, 73 y.o.   MRN: [id number]  Michelle Fuentes is a 73 y.o. female presenting on 03/11/2019 for Mass (Right leg upper extrimities onset few months small lump)  Previous PCP Cassell Smiles, AGPCNP-BC   HPI   L Shoulder Blade Pain Reports episodic aching of L shoulder blade posteriorly has muscle tension she reports in this area, tender to touch and sore, tried biofreeze topical, she is able to move shoulder and arm, had history of frozen shoulder previously but not now. Seems to affect her if repetitive motion and at times. Denies any numbness weakness or other joint pain swelling  Lipoma vs Hematoma / Right Upper Thigh Bump Prior mild injury to R upper thigh in this same area, had mild bruise, described it as a "goose egg" from bumping into dresser/furniture. No other significant injury. She had some mild soreness in that area that resolved, now just has residual bump deeper in. No bruise or redness now or other problem.  Health Maintenance: Due for Flu Shot, will receive today    Depression screen [facility] 2/9 03/11/2019 03/26/2017 04/28/2015  Decreased Interest 0 0 0  Down, Depressed, Hopeless 0 0 0  PHQ - 2 Score 0 0 0    Social History   Tobacco Use  . Smoking status: Never Smoker  . Smokeless tobacco: Never Used  Substance Use Topics  . Alcohol use: Yes    Comment: rare  . Drug use: No    Review of Systems Per HPI unless specifically indicated above     Objective:    BP 126/80   Pulse 93   Temp 98.4 F (36.9 C) (Oral)   Resp 16   Ht 5\' 5"  (1.651 m)   Wt 159 lb (72.1 kg)   BMI 26.46 kg/m   Wt Readings from Last 3 Encounters:  03/11/19 159 lb (72.1 kg)  04/28/18 159 lb 6.4 oz (72.3 kg)  07/19/17 149 lb 9.6 oz (67.9 kg)    Physical Exam Vitals signs and nursing note reviewed.  Constitutional:      General: She is not in acute distress.    Appearance: She is well-developed. She is not  diaphoretic.     Comments: Well-appearing, comfortable, cooperative  HENT:     Head: Normocephalic and atraumatic.  Eyes:     General:        Right eye: No discharge.        Left eye: No discharge.     Conjunctiva/sclera: Conjunctivae normal.  Cardiovascular:     Rate and Rhythm: Normal rate.  Pulmonary:     Effort: Pulmonary effort is normal.  Musculoskeletal:     Comments: L shoulder Localized muscle hypertonicity and tenderness to L scapular muscle insertion and levator scap, has full AROM of Left shoulder. Distal sensation intact, grip intact.  Skin:    General: Skin is warm and dry.     Findings: No erythema or rash.     Comments: Right upper anterior thigh approx 2 x 2 cm area of localized well defined deeper palpable smooth lesion, possible deeper hematoma vs scar tissue from contusion vs possible lipoma  Neurological:     Mental Status: She is alert and oriented to person, place, and time.  Psychiatric:        Behavior: Behavior normal.     Comments: Well groomed, good eye contact, normal speech and thoughts    Results for orders  placed or performed in visit on 04/22/18  CBC with Differential/Platelet  Result Value Ref Range   WBC 7.6 3.8 - 10.8 Thousand/uL   RBC 4.95 3.80 - 5.10 Million/uL   Hemoglobin 14.0 11.7 - 15.5 g/dL   HCT 41.4 35.0 - 45.0 %   MCV 83.6 80.0 - 100.0 fL   MCH 28.3 27.0 - 33.0 pg   MCHC 33.8 32.0 - 36.0 g/dL   RDW 12.7 11.0 - 15.0 %   Platelets 403 (H) 140 - 400 Thousand/uL   MPV 10.7 7.5 - 12.5 fL   Neutro Abs 4,666 1,500 - 7,800 cells/uL   Lymphs Abs 1,900 850 - 3,900 cells/uL   Absolute Monocytes 699 200 - 950 cells/uL   Eosinophils Absolute 266 15 - 500 cells/uL   Basophils Absolute 68 0 - 200 cells/uL   Neutrophils Relative % 61.4 %   Total Lymphocyte 25.0 %   Monocytes Relative 9.2 %   Eosinophils Relative 3.5 %   Basophils Relative 0.9 %  TSH  Result Value Ref Range   TSH 1.51 0.40 - 4.50 mIU/L  Lipid panel  Result Value Ref  Range   Cholesterol 245 (H) <200 mg/dL   HDL 61 >50 mg/dL   Triglycerides 150 (H) <150 mg/dL   LDL Cholesterol (Calc) 156 (H) mg/dL (calc)   Total CHOL/HDL Ratio 4.0 <5.0 (calc)   Non-HDL Cholesterol (Calc) 184 (H) <130 mg/dL (calc)  COMPLETE METABOLIC PANEL WITH GFR  Result Value Ref Range   Glucose, Bld 77 65 - 99 mg/dL   BUN 21 7 - 25 mg/dL   Creat 0.78 0.60 - 0.93 mg/dL   GFR, Est Non African American 76 > OR = 60 mL/min/1.61m2   GFR, Est African American 88 > OR = 60 mL/min/1.17m2   BUN/Creatinine Ratio NOT APPLICABLE 6 - 22 (calc)   Sodium 139 135 - 146 mmol/L   Potassium 4.0 3.5 - 5.3 mmol/L   Chloride 100 98 - 110 mmol/L   CO2 28 20 - 32 mmol/L   Calcium 9.8 8.6 - 10.4 mg/dL   Total Protein 6.3 6.1 - 8.1 g/dL   Albumin 4.2 3.6 - 5.1 g/dL   Globulin 2.1 1.9 - 3.7 g/dL (calc)   AG Ratio 2.0 1.0 - 2.5 (calc)   Total Bilirubin 0.4 0.2 - 1.2 mg/dL   Alkaline phosphatase (APISO) 58 33 - 130 U/L   AST 13 10 - 35 U/L   ALT 11 6 - 29 U/L      Assessment & Plan:   Problem List Items Addressed This Visit    None    Visit Diagnoses    Muscle strain of left scapular region, initial encounter    -  Primary   Needs flu shot       Relevant Orders   [facility] High Dose 2020+ Flu Vaccine QUAD High Dose(Fluad) (Completed)   Lipoma of right thigh          #L scapular muscle strain Uncertain clear etiology, may be chronic issue with cumulative strain or recent flare. Not affecting shoulder or ROM. No sign of rotator cuff issue Some trigger points identified on exam, levator scap Reassurance Offer OTC therapy, remedy She declined muscle relaxant, baclofen - may order if she request in future Follow-up   #Lipoma vs residual scar tissue from hematoma Reassurance, benign exam. No further testing or treatment needed based on history and exam. May not resolve.  No orders of the defined types were placed in this encounter.  Follow up plan: Return if symptoms worsen or fail to  improve.   Nobie Putnam, [facility] 03/11/2019, 3:15 PM

## 2019-05-12 ENCOUNTER — Other Ambulatory Visit: Payer: Self-pay | Admitting: Nurse Practitioner

## 2019-05-12 DIAGNOSIS — E039 Hypothyroidism, unspecified: Secondary | ICD-10-CM

## 2019-05-18 IMAGING — MG MM DIGITAL SCREENING BILAT W/ TOMO W/ CAD
8 of 12 series · 8 of 28 positions shown · non-contrast
Comparison: Previous exam(s).

CLINICAL DATA: Screening.

EXAM:
2D DIGITAL SCREENING BILATERAL MAMMOGRAM WITH 3D TOMO WITH CAD

[L MLO synth-2D]
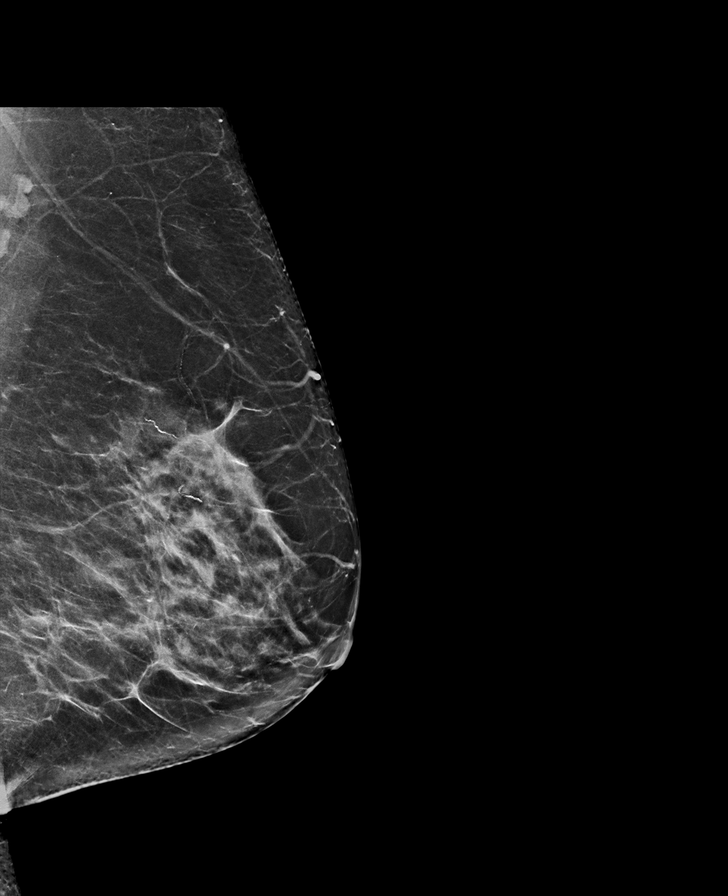

[L MLO]
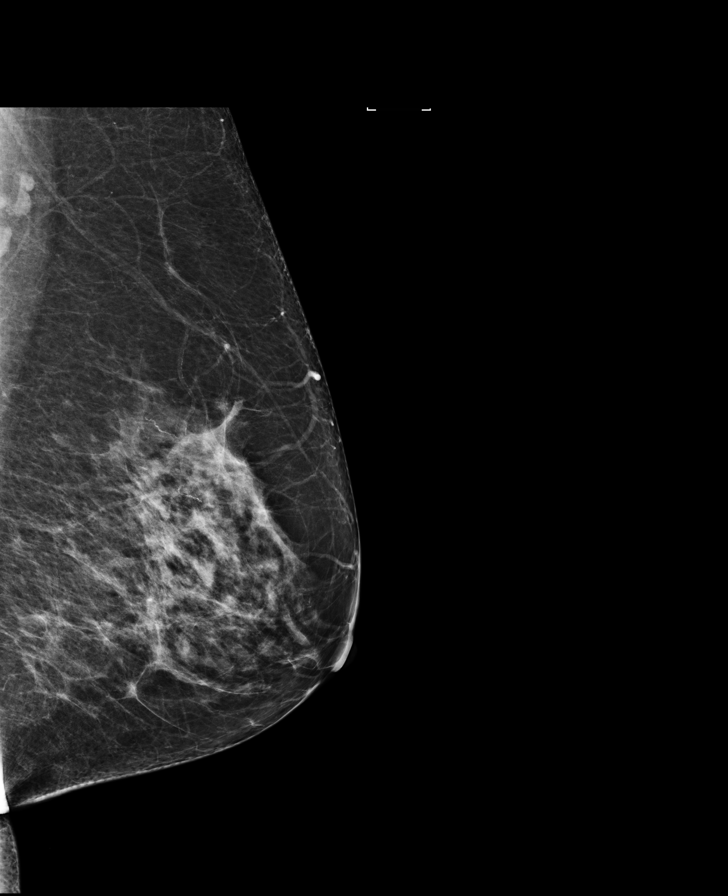

[R MLO synth-2D]
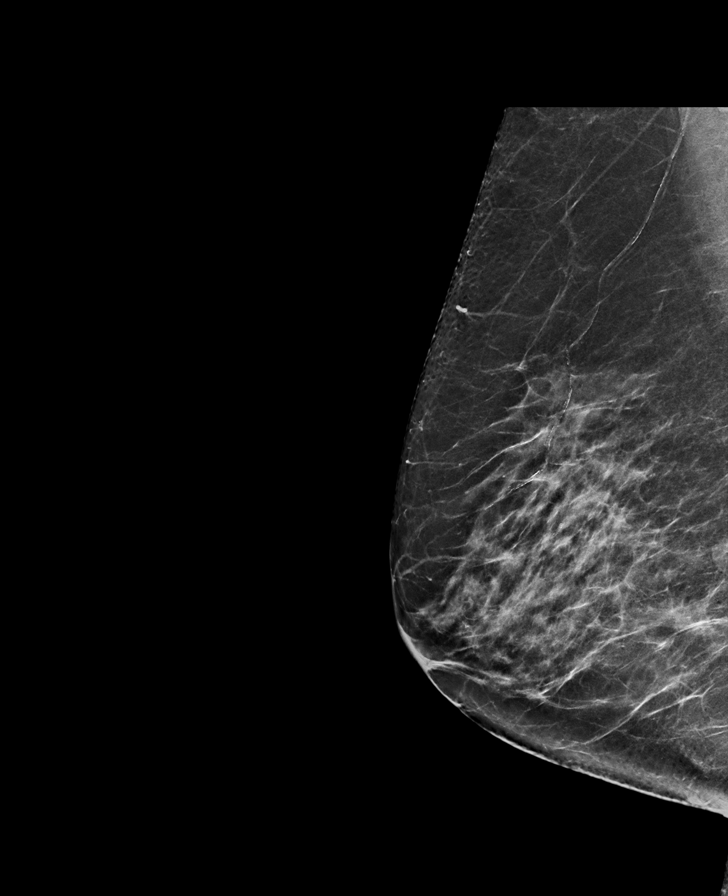

[R MLO]
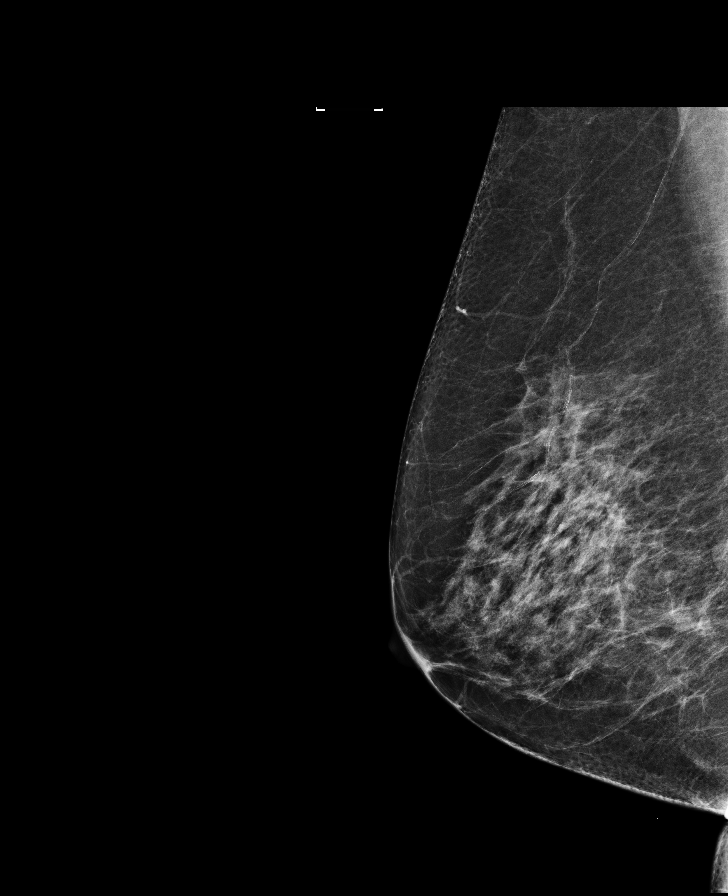

[L CC synth-2D]
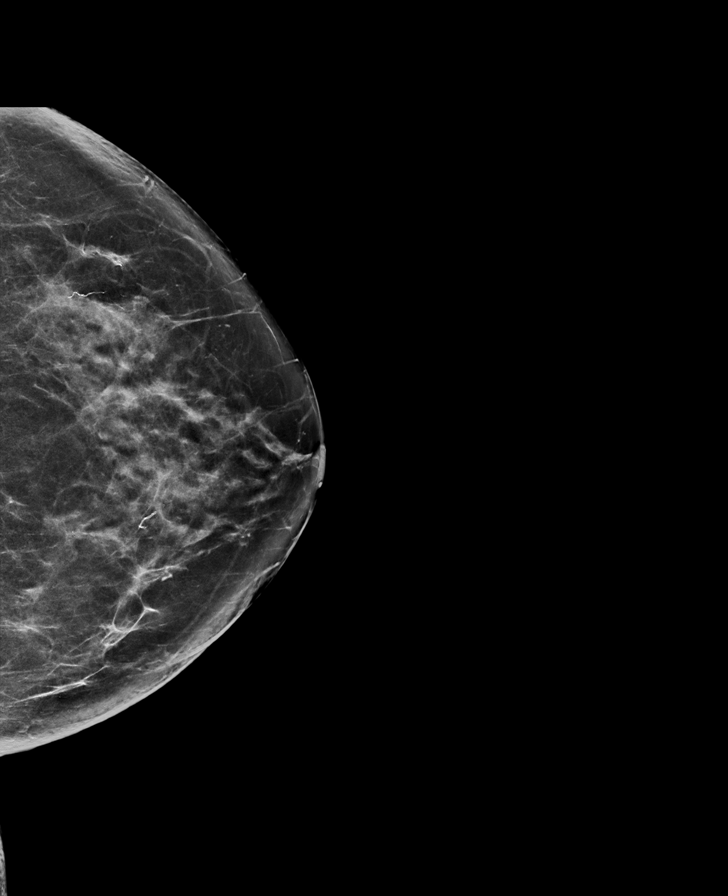

[L CC]
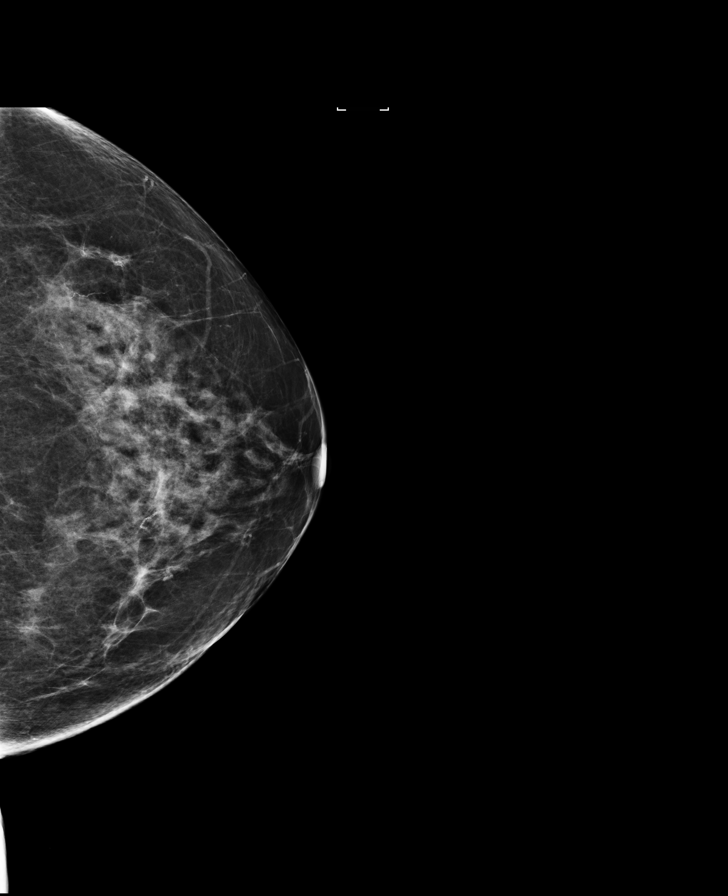

[R CC]
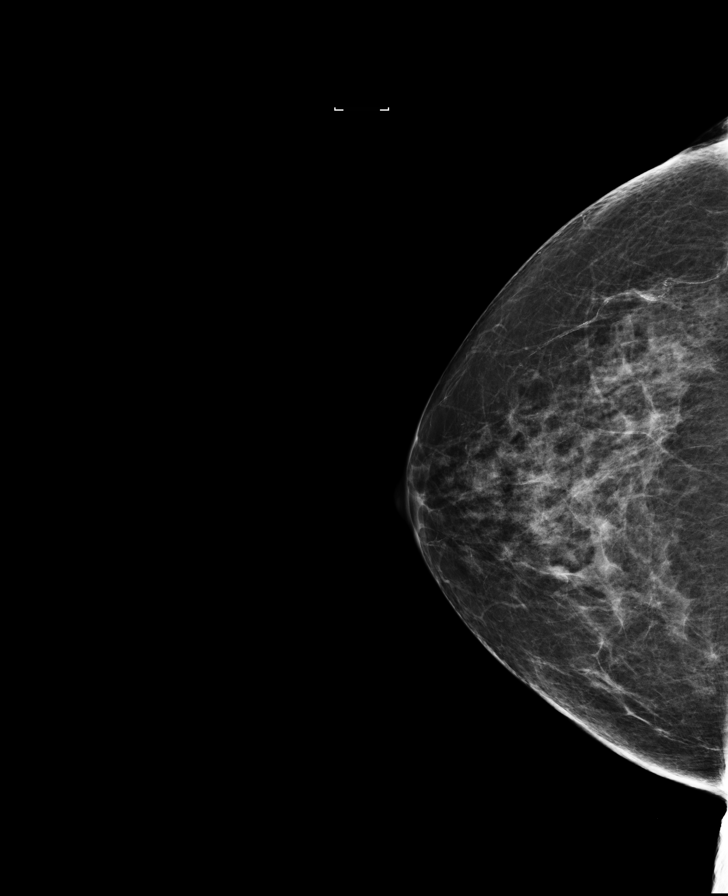

[R CC synth-2D]
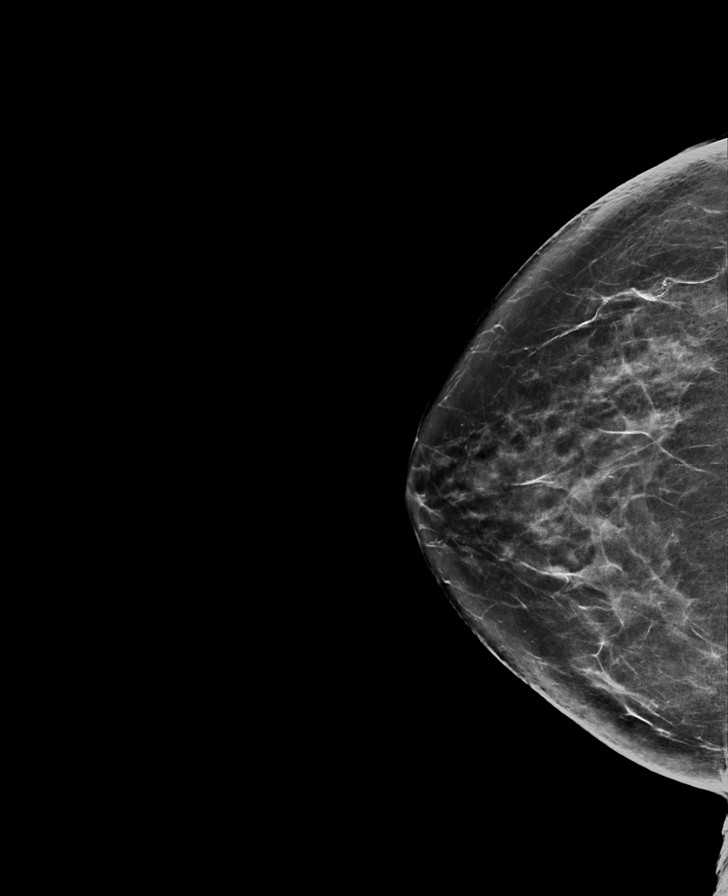

[8 of 28 positions shown; findings below may reference images not displayed]

ACR Breast Density Category c: The breast tissue is heterogeneously
dense, which may obscure small masses.
FINDINGS: There are no findings suspicious for malignancy. Images were
processed with CAD.
IMPRESSION: No mammographic evidence of malignancy. A result letter of this
screening mammogram will be mailed directly to the patient.

RECOMMENDATION:
Screening mammogram in one year. (Code:UA-9-KQN)

BI-RADS CATEGORY  1: Negative.

## 2019-05-21 DIAGNOSIS — H2513 Age-related nuclear cataract, bilateral: Secondary | ICD-10-CM | POA: Diagnosis not present

## 2019-05-26 ENCOUNTER — Ambulatory Visit (INDEPENDENT_AMBULATORY_CARE_PROVIDER_SITE_OTHER): Payer: Medicare HMO

## 2019-05-26 ENCOUNTER — Other Ambulatory Visit: Payer: Self-pay

## 2019-05-26 VITALS — BP 148/78 | HR 85 | Temp 97.7°F | Resp 16 | Ht 65.0 in | Wt 154.8 lb

## 2019-05-26 DIAGNOSIS — Z0000 Encounter for general adult medical examination without abnormal findings: Secondary | ICD-10-CM

## 2019-05-26 DIAGNOSIS — Z1231 Encounter for screening mammogram for malignant neoplasm of breast: Secondary | ICD-10-CM

## 2019-05-26 NOTE — Progress Notes (Addendum)
Subjective:   Michelle Fuentes is a 74 y.o. female who presents for Medicare Annual (Subsequent) preventive examination.  Review of Systems:   Cardiac Risk Factors include: advanced age (>62men, >42 women);dyslipidemia;hypertension     Objective:     Vitals: BP (!) 148/78 (BP Location: Left Arm, Patient Position: Sitting, Cuff Size: Normal)   Pulse 85   Temp 97.7 F (36.5 C) (Temporal)   Resp 16   Ht 5\' 5"  (1.651 m)   Wt 154 lb 12.8 oz (70.2 kg)   BMI 25.76 kg/m   Body mass index is 25.76 kg/m.  Advanced Directives 05/26/2019 03/26/2017  Does Patient Have a Medical Advance Directive? Yes Yes  Type of Advance Directive Living will;Healthcare Power of [place];Living will  Copy of [place] in Chart? No - copy requested No - copy requested    Tobacco Social History   Tobacco Use  Smoking Status Never Smoker  Smokeless Tobacco Never Used     Counseling given: Not Answered   Clinical Intake:  Pre-visit preparation completed: Yes  Pain : No/denies pain     Nutritional Status: BMI 25 -29 Overweight Nutritional Risks: None Diabetes: No  How often do you need to have someone help you when you read instructions, pamphlets, or other written materials from your doctor or pharmacy?: 1 - Never  Interpreter Needed?: No  Information entered by :: Michelle Routzahn,LPN  Past Medical History:  Diagnosis Date  . GERD (gastroesophageal reflux disease)   . History of TIA (transient ischemic attack)   . Hypothyroidism   . Iron deficiency anemia   . Meniere disease   . Mixed hyperlipidemia   . Osteopenia   . Seasonal allergies    Past Surgical History:  Procedure Laterality Date  . eye surgery    . mole removed    . TONSILLECTOMY     Family History  Problem Relation Age of Onset  . Hypertension Father   . Lung cancer Father   . Lymphoma Brother   . Breast cancer Maternal Grandmother 80  . Melanoma Brother   .  Stroke Mother   . Ovarian cancer Neg Hx   . Colon cancer Neg Hx   . Diabetes Neg Hx   . Heart disease Neg Hx    Social History   Socioeconomic History  . Marital status: Married    Spouse name: Not on file  . Number of children: Not on file  . Years of education: masters  . Highest education level: Master's degree (e.g., MA, MS, MEng, MEd, MSW, MBA)  Occupational History  . Not on file  Tobacco Use  . Smoking status: Never Smoker  . Smokeless tobacco: Never Used  Substance and Sexual Activity  . Alcohol use: Yes    Comment: rare ( wine- maybe once a year )  . Drug use: No  . Sexual activity: Yes    Birth control/protection: Post-menopausal  Other Topics Concern  . Not on file  Social History Narrative  . Not on file   Social Determinants of Health   Financial Resource Strain:   . Difficulty of Paying Living Expenses: Not on file  Food Insecurity:   . Worried About Charity fundraiser in the Last Year: Not on file  . Ran Out of Food in the Last Year: Not on file  Transportation Needs:   . Lack of Transportation (Medical): Not on file  . Lack of Transportation (Non-Medical): Not on file  Physical Activity:   . Days of Exercise per Week: Not on file  . Minutes of Exercise per Session: Not on file  Stress:   . Feeling of Stress : Not on file  Social Connections:   . Frequency of Communication with Friends and Family: Not on file  . Frequency of Social Gatherings with Friends and Family: Not on file  . Attends Religious Services: Not on file  . Active Member of Clubs or Organizations: Not on file  . Attends Archivist Meetings: Not on file  . Marital Status: Not on file    Outpatient Encounter Medications as of 05/26/2019  Medication Sig  . Cholecalciferol 1000 UNITS capsule Take 1,000 Units by mouth daily.   . diazepam (VALIUM) 2 MG tablet Take 2 mg by mouth every 6 (six) hours as needed.   . fluticasone (FLONASE) 50 MCG/ACT nasal spray SPRAY 2 SPRAYS  INTO EACH NOSTRIL EVERY DAY  . levothyroxine (SYNTHROID) 88 MCG tablet TAKE 1 TABLET (88 MCG TOTAL) BY MOUTH DAILY BEFORE BREAKFAST.  Marland Kitchen loratadine (CLARITIN) 10 MG tablet Take 10 mg daily by mouth.  . Lutein 6 MG CAPS Take 10 mg by mouth daily.   . Magnesium Gluconate 250 MG TABS Take 250 mg by mouth daily.   . meclizine (ANTIVERT) 25 MG tablet Take 25 mg by mouth once as needed.   Marland Kitchen omeprazole (PRILOSEC) 20 MG capsule Take 20 mg by mouth daily.  . predniSONE (DELTASONE) 10 MG tablet Take 10 mg by mouth daily.  Marland Kitchen triamterene-hydrochlorothiazide (DYAZIDE) 37.5-25 MG per capsule Take 1 capsule by mouth daily.   . Ginkgo Biloba Extract 60 MG CAPS Take 120 mg by mouth.  . triamcinolone cream (KENALOG) 0.5 % Apply 1 application topically 2 (two) times daily. To affected areas, for up to 2 weeks. (Patient not taking: Reported on 05/26/2019)  . [DISCONTINUED] ranitidine (ZANTAC) 150 MG tablet Take 150 mg by mouth.   No facility-administered encounter medications on file as of 05/26/2019.    Activities of Daily Living In your present state of health, do you have any difficulty performing the following activities: 05/26/2019  Hearing? Y  Comment no hearing aids, difficulty in left ear, some difficulty in right ear  Vision? N  Comment eyeglasses, Dr. Wallace Going  Difficulty concentrating or making decisions? N  Walking or climbing stairs? N  Dressing or bathing? N  Doing errands, shopping? N  Preparing Food and eating ? N  Using the Toilet? N  In the past six months, have you accidently leaked urine? Y  Comment prolapse, wears pads  Do you have problems with loss of bowel control? N  Managing your Medications? N  Managing your Finances? N  Housekeeping or managing your Housekeeping? N  Some recent data might be hidden    Patient Care Team: Mikey College, NP (Inactive) as PCP - General (Nurse Practitioner) Sanjuana Kava, MD as Referring Physician (Otolaryngology) Carloyn Manner, MD as Referring Physician (Otolaryngology)    Assessment:   This is a routine wellness examination for Michelle Fuentes.  Exercise Activities and Dietary recommendations Current Exercise Habits: The patient does not participate in regular exercise at present(stays active at home), Exercise limited by: None identified  Goals    . DIET - INCREASE WATER INTAKE     Recommend drinking at least 5-6 glasses of water a day       Fall Risk: Fall Risk  05/26/2019 03/11/2019 03/26/2017 04/28/2015  Falls in the past year? 0 0  No No  Number falls in past yr: 0 - - -  Injury with Fall? 0 - - -    FALL RISK PREVENTION PERTAINING TO THE HOME:  Any stairs in or around the home? Yes  going in house  If so, are there any without handrails? No   Home free of loose throw rugs in walkways, pet beds, electrical cords, etc? Yes  Adequate lighting in your home to reduce risk of falls? Yes   ASSISTIVE DEVICES UTILIZED TO PREVENT FALLS:  Life alert? No  Use of a cane, walker or w/c? No  Grab bars in the bathroom? Yes  Shower chair or bench in shower? No  Elevated toilet seat or a handicapped toilet? Comfort height toilet   DME ORDERS:  DME order needed?  No   TIMED UP AND GO:  Was the test performed? Yes .  Length of time to ambulate 10 feet: 8 sec.   GAIT:  Appearance of gait: Gait steady and fast without the use of an assistive device.  Education: Fall risk prevention has been discussed.  Intervention(s) required? No   DME/home health order needed?  No    Depression Screen PHQ 2/9 Scores 05/26/2019 03/11/2019 03/26/2017 04/28/2015  PHQ - 2 Score 1 0 0 0     Cognitive Function        Immunization History  Administered Date(s) Administered  . Fluad Quad(high Dose 65+) 03/11/2019  . Influenza, High Dose Seasonal PF 04/28/2015, 04/11/2017, 04/28/2018  . Influenza,inj,Quad PF,6+ Mos 01/12/2013    Qualifies for Shingles Vaccine? Yes  Zostavax completed n/a. Due for Shingrix.  Education has been provided regarding the importance of this vaccine. Pt has been advised to call insurance company to determine out of pocket expense. Advised may also receive vaccine at local pharmacy or Health Dept. Verbalized acceptance and understanding.  Tdap: up to date   Flu Vaccine: up to date   Pneumococcal Vaccine: will check records on vaccines.   Screening Tests Health Maintenance  Topic Date Due  . Hepatitis C Screening  November 17, 1945  . PNA vac Low Risk Adult (1 of 2 - PCV13) 03/10/2011  . MAMMOGRAM  07/15/2020  . TETANUS/TDAP  05/07/2021  . COLONOSCOPY  05/08/2023  . INFLUENZA VACCINE  Completed  . DEXA SCAN  Completed    Cancer Screenings:  Colorectal Screening: completed 05/07/2013  Mammogram: completed 07/16/2018  Ordered   Bone Density: completed 07/16/2018   Lung Cancer Screening: (Low Dose CT Chest recommended if Age 70-80 years, 30 pack-year currently smoking OR have quit w/in 15years.) does not qualify.   Additional Screening:  Hepatitis C Screening: does qualify  Vision Screening: Recommended annual ophthalmology exams for early detection of glaucoma and other disorders of the eye. Is the patient up to date with their annual eye exam?  Yes  Who is the provider or what is the name of the office in which the pt attends annual eye exams? [facility]   Dental Screening: Recommended annual dental exams for proper oral hygiene  Community Resource Referral:  CRR required this visit?  No       Plan:  I have personally reviewed and addressed the Medicare Annual Wellness questionnaire and have noted the following in the patient's chart:  A. Medical and social history B. Use of alcohol, tobacco or illicit drugs  C. Current medications and supplements D. Functional ability and status E.  Nutritional status F.  Physical activity G. Advance directives H. List of other physicians I.  Hospitalizations, surgeries, and ER visits in previous 12 months J.   [place] such as hearing and vision if needed, cognitive and depression L. Referrals and appointments   In addition, I have reviewed and discussed with patient certain preventive protocols, quality metrics, and best practice recommendations. A written personalized care plan for preventive services as well as general preventive health recommendations were provided to patient.  Signed,    Bevelyn Ngo, LPN  [id number] Nurse Health Advisor   Nurse Notes: none

## 2019-05-26 NOTE — Patient Instructions (Addendum)
Michelle Fuentes , Thank you for taking time to come for your Medicare Wellness Visit. I appreciate your ongoing commitment to your health goals. Please review the following plan we discussed and let me know if I can assist you in the future.   Screening recommendations/referrals: Colonoscopy: completed 2015 Mammogram: completed 07/16/2018 Please call [phone] to schedule your mammogram.  Bone Density: completed 07/16/2018 Recommended yearly ophthalmology/optometry visit for glaucoma screening and checkup Recommended yearly dental visit for hygiene and checkup  Vaccinations: Influenza vaccine: up to date Pneumococcal vaccine: due now  Tdap vaccine: up to date Shingles vaccine: shingrix eligible     Advanced directives: Please bring a copy of your health care power of attorney and living will to the office at your convenience.  Conditions/risks identified: none   Next appointment: Follow up in one year for your annual wellness visit    Preventive Care 65 Years and Older, Female Preventive care refers to lifestyle choices and visits with your health care provider that can promote health and wellness. What does preventive care include?  A yearly physical exam. This is also called an annual well check.  Dental exams once or twice a year.  Routine eye exams. Ask your health care provider how often you should have your eyes checked.  Personal lifestyle choices, including:  Daily care of your teeth and gums.  Regular physical activity.  Eating a healthy diet.  Avoiding tobacco and drug use.  Limiting alcohol use.  Practicing safe sex.  Taking low-dose aspirin every day.  Taking vitamin and mineral supplements as recommended by your health care provider. What happens during an annual well check? The services and screenings done by your health care provider during your annual well check will depend on your age, overall health, lifestyle risk factors, and family history of  disease. Counseling  Your health care provider may ask you questions about your:  Alcohol use.  Tobacco use.  Drug use.  Emotional well-being.  Home and relationship well-being.  Sexual activity.  Eating habits.  History of falls.  Memory and ability to understand (cognition).  Work and work Statistician.  Reproductive health. Screening  You may have the following tests or measurements:  Height, weight, and BMI.  Blood pressure.  Lipid and cholesterol levels. These may be checked every 5 years, or more frequently if you are over 74 years old.  Skin check.  Lung cancer screening. You may have this screening every year starting at age 74 if you have a 30-pack-year history of smoking and currently smoke or have quit within the past 15 years.  Fecal occult blood test (FOBT) of the stool. You may have this test every year starting at age 74.  Flexible sigmoidoscopy or colonoscopy. You may have a sigmoidoscopy every 5 years or a colonoscopy every 10 years starting at age 74.  Hepatitis C blood test.  Hepatitis B blood test.  Sexually transmitted disease (STD) testing.  Diabetes screening. This is done by checking your blood sugar (glucose) after you have not eaten for a while (fasting). You may have this done every 1-3 years.  Bone density scan. This is done to screen for osteoporosis. You may have this done starting at age 74.  Mammogram. This may be done every 1-2 years. Talk to your health care provider about how often you should have regular mammograms. Talk with your health care provider about your test results, treatment options, and if necessary, the need for more tests. Vaccines  Your health care  provider may recommend certain vaccines, such as:  Influenza vaccine. This is recommended every year.  Tetanus, diphtheria, and acellular pertussis (Tdap, Td) vaccine. You may need a Td booster every 10 years.  Zoster vaccine. You may need this after age  74.  Pneumococcal 13-valent conjugate (PCV13) vaccine. One dose is recommended after age 74.  Pneumococcal polysaccharide (PPSV23) vaccine. One dose is recommended after age 74. Talk to your health care provider about which screenings and vaccines you need and how often you need them. This information is not intended to replace advice given to you by your health care provider. Make sure you discuss any questions you have with your health care provider. Document Released: 05/20/2015 Document Revised: 01/11/2016 Document Reviewed: 02/22/2015 Elsevier Interactive Patient Education  2017 [place] Prevention in the Home Falls can cause injuries. They can happen to people of all ages. There are many things you can do to make your home safe and to help prevent falls. What can I do on the outside of my home?  Regularly fix the edges of walkways and driveways and fix any cracks.  Remove anything that might make you trip as you walk through a door, such as a raised step or threshold.  Trim any bushes or trees on the path to your home.  Use bright outdoor lighting.  Clear any walking paths of anything that might make someone trip, such as rocks or tools.  Regularly check to see if handrails are loose or broken. Make sure that both sides of any steps have handrails.  Any raised decks and porches should have guardrails on the edges.  Have any leaves, snow, or ice cleared regularly.  Use sand or salt on walking paths during winter.  Clean up any spills in your garage right away. This includes oil or grease spills. What can I do in the bathroom?  Use night lights.  Install grab bars by the toilet and in the tub and shower. Do not use towel bars as grab bars.  Use non-skid mats or decals in the tub or shower.  If you need to sit down in the shower, use a plastic, non-slip stool.  Keep the floor dry. Clean up any water that spills on the floor as soon as it happens.  Remove  soap buildup in the tub or shower regularly.  Attach bath mats securely with double-sided non-slip rug tape.  Do not have throw rugs and other things on the floor that can make you trip. What can I do in the bedroom?  Use night lights.  Make sure that you have a light by your bed that is easy to reach.  Do not use any sheets or blankets that are too big for your bed. They should not hang down onto the floor.  Have a firm chair that has side arms. You can use this for support while you get dressed.  Do not have throw rugs and other things on the floor that can make you trip. What can I do in the kitchen?  Clean up any spills right away.  Avoid walking on wet floors.  Keep items that you use a lot in easy-to-reach places.  If you need to reach something above you, use a strong step stool that has a grab bar.  Keep electrical cords out of the way.  Do not use floor polish or wax that makes floors slippery. If you must use wax, use non-skid floor wax.  Do not have throw  rugs and other things on the floor that can make you trip. What can I do with my stairs?  Do not leave any items on the stairs.  Make sure that there are handrails on both sides of the stairs and use them. Fix handrails that are broken or loose. Make sure that handrails are as long as the stairways.  Check any carpeting to make sure that it is firmly attached to the stairs. Fix any carpet that is loose or worn.  Avoid having throw rugs at the top or bottom of the stairs. If you do have throw rugs, attach them to the floor with carpet tape.  Make sure that you have a light switch at the top of the stairs and the bottom of the stairs. If you do not have them, ask someone to add them for you. What else can I do to help prevent falls?  Wear shoes that:  Do not have high heels.  Have rubber bottoms.  Are comfortable and fit you well.  Are closed at the toe. Do not wear sandals.  If you use a  stepladder:  Make sure that it is fully opened. Do not climb a closed stepladder.  Make sure that both sides of the stepladder are locked into place.  Ask someone to hold it for you, if possible.  Clearly mark and make sure that you can see:  Any grab bars or handrails.  First and last steps.  Where the edge of each step is.  Use tools that help you move around (mobility aids) if they are needed. These include:  Canes.  Walkers.  Scooters.  Crutches.  Turn on the lights when you go into a dark area. Replace any light bulbs as soon as they burn out.  Set up your furniture so you have a clear path. Avoid moving your furniture around.  If any of your floors are uneven, fix them.  If there are any pets around you, be aware of where they are.  Review your medicines with your doctor. Some medicines can make you feel dizzy. This can increase your chance of falling. Ask your doctor what other things that you can do to help prevent falls. This information is not intended to replace advice given to you by your health care provider. Make sure you discuss any questions you have with your health care provider. Document Released: 02/17/2009 Document Revised: 09/29/2015 Document Reviewed: 05/28/2014 Elsevier Interactive Patient Education  2017 [employer].

## 2019-06-01 ENCOUNTER — Telehealth: Payer: Self-pay | Admitting: Nurse Practitioner

## 2019-06-01 NOTE — Chronic Care Management (AMB) (Signed)
  Chronic Care Management   Note  06/01/2019 Name: Michelle Fuentes MRN: [id number] DOB: [birth year]  Michelle Fuentes is a 74 y.o. year old female who is a primary care patient of Mikey College, NP (Inactive). I reached out to [place] by phone today in response to a referral sent by Michelle Fuentes's PCP, Dr. Nobie Putnam     Michelle Fuentes was given information about Chronic Care Management services today including:  1. CCM service includes personalized support from designated clinical staff supervised by her physician, including individualized plan of care and coordination with other care providers 2. 24/7 contact phone numbers for assistance for urgent and routine care needs. 3. Service will only be billed when office clinical staff spend 20 minutes or more in a month to coordinate care. 4. Only one practitioner may furnish and bill the service in a calendar month. 5. The patient may stop CCM services at any time (effective at the end of the month) by phone call to the office staff. 6. The patient will be responsible for cost sharing (co-pay) of up to 20% of the service fee (after annual deductible is met).  Patient agreed to services and verbal consent obtained.   Follow up plan: Telephone appointment with care management team member scheduled for:07/09/2019  Glenna Durand, LPN Health Advisor, [facility] ??[email or web address] ??[phone]

## 2019-06-10 DIAGNOSIS — R69 Illness, unspecified: Secondary | ICD-10-CM | POA: Diagnosis not present

## 2019-07-09 ENCOUNTER — Telehealth: Payer: Medicare HMO | Admitting: General Practice

## 2019-07-09 ENCOUNTER — Ambulatory Visit (INDEPENDENT_AMBULATORY_CARE_PROVIDER_SITE_OTHER): Payer: Medicare HMO | Admitting: General Practice

## 2019-07-09 DIAGNOSIS — E785 Hyperlipidemia, unspecified: Secondary | ICD-10-CM

## 2019-07-09 DIAGNOSIS — I6523 Occlusion and stenosis of bilateral carotid arteries: Secondary | ICD-10-CM

## 2019-07-09 NOTE — Patient Instructions (Addendum)
Visit Information  Goals Addressed            This Visit's Progress   . RNCM: Pt-"I want to know how to lowermy cholestorl without using statins" (pt-stated)       CARE PLAN ENTRY (see longtitudinal plan of care for additional care plan information)  Current Barriers:  . Chronic Disease Management support, education, and care coordination needs related to HLD and Atherosclerosis  Clinical Goal(s) related to HLD and Atherosclerosis :  Over the next 120 days, patient will:  . Work with the care management team to address educational, disease management, and care coordination needs  . Begin or continue self health monitoring activities as directed today  adhere to a Heart Healthy Diet . Call provider office for new or worsened signs and symptoms New or worsened symptom related to HLD and Atherosclerosis   . Call care management team with questions or concerns . Verbalize basic understanding of patient centered plan of care established today  Interventions related to HLD and Atherosclerosis  :  . Evaluation of current treatment plans and patient's adherence to plan as established by provider . Assessed patient understanding of disease states . Assessed patient's education and care coordination needs . Provided disease specific education to patient- provided the patient with low cholesterol food options. The patient does not wish to take statins and wants natural ways to reduce cholesterol.  . Education about the My Chart functionality for education and viewing reports. Also the EMMI program to send information to the patient by. Will send information on lowering cholesterol via Emmi  . Collaborated with appropriate clinical care team members regarding patient needs  Patient Self Care Activities related to HLD and Athersclerosis :  . Patient is unable to independently self-manage chronic health conditions  Initial goal documentation        Ms. Gergely was given information about  Chronic Care Management services today including:  1. CCM service includes personalized support from designated clinical staff supervised by her physician, including individualized plan of care and coordination with other care providers 2. 24/7 contact phone numbers for assistance for urgent and routine care needs. 3. Service will only be billed when office clinical staff spend 20 minutes or more in a month to coordinate care. 4. Only one practitioner may furnish and bill the service in a calendar month. 5. The patient may stop CCM services at any time (effective at the end of the month) by phone call to the office staff. 6. The patient will be responsible for cost sharing (co-pay) of up to 20% of the service fee (after annual deductible is met).  Patient agreed to services and verbal consent obtained.   Print copy of patient instructions provided.   Telephone follow up appointment with care management team member scheduled for: 09-30-2019 at [place] RN, MSN, [place] Mobile: [phone]   Fat and Cholesterol Restricted Eating Plan Getting too much fat and cholesterol in your diet may cause health problems. Choosing the right foods helps keep your fat and cholesterol at normal levels. This can keep you from getting certain diseases. Your doctor may recommend an eating plan that includes:  Total fat: ______% or less of total calories a day.  Saturated fat: ______% or less of total calories a day.  Cholesterol: less than _________mg a day.  Fiber: ______g a day. What are tips for following this plan? Meal planning  At  meals, divide your plate into four equal parts: ? Fill one-half of your plate with vegetables and green salads. ? Fill one-fourth of your plate with whole grains. ? Fill one-fourth of your plate with low-fat (lean) protein foods.  Eat fish that is high in omega-3 fats at  least two times a week. This includes mackerel, tuna, sardines, and salmon.  Eat foods that are high in fiber, such as whole grains, beans, apples, broccoli, carrots, peas, and barley. General tips   Work with your doctor to lose weight if you need to.  Avoid: ? Foods with added sugar. ? Fried foods. ? Foods with partially hydrogenated oils.  Limit alcohol intake to no more than 1 drink a day for nonpregnant women and 2 drinks a day for men. One drink equals 12 oz of beer, 5 oz of wine, or 1 oz of hard liquor. Reading food labels  Check food labels for: ? Trans fats. ? Partially hydrogenated oils. ? Saturated fat (g) in each serving. ? Cholesterol (mg) in each serving. ? Fiber (g) in each serving.  Choose foods with healthy fats, such as: ? Monounsaturated fats. ? Polyunsaturated fats. ? Omega-3 fats.  Choose grain products that have whole grains. Look for the word "whole" as the first word in the ingredient list. Cooking  Cook foods using low-fat methods. These include baking, boiling, grilling, and broiling.  Eat more home-cooked foods. Eat at restaurants and buffets less often.  Avoid cooking using saturated fats, such as butter, cream, palm oil, palm kernel oil, and coconut oil. Recommended foods  Fruits  All fresh, canned (in natural juice), or frozen fruits. Vegetables  Fresh or frozen vegetables (raw, steamed, roasted, or grilled). Green salads. Grains  Whole grains, such as whole wheat or whole grain breads, crackers, cereals, and pasta. Unsweetened oatmeal, bulgur, barley, quinoa, or brown rice. Corn or whole wheat flour tortillas. Meats and other protein foods  Ground beef (85% or leaner), grass-fed beef, or beef trimmed of fat. Skinless chicken or [place]. Ground chicken or [place]. Pork trimmed of fat. All fish and seafood. Egg whites. Dried beans, peas, or lentils. Unsalted nuts or seeds. Unsalted canned beans. Nut butters without added sugar or  oil. Dairy  Low-fat or nonfat dairy products, such as skim or 1% milk, 2% or reduced-fat cheeses, low-fat and fat-free ricotta or cottage cheese, or plain low-fat and nonfat yogurt. Fats and oils  Tub margarine without trans fats. Light or reduced-fat mayonnaise and salad dressings. Avocado. Olive, canola, sesame, or safflower oils. The items listed above may not be a complete list of foods and beverages you can eat. Contact a dietitian for more information. Foods to avoid Fruits  Canned fruit in heavy syrup. Fruit in cream or butter sauce. Fried fruit. Vegetables  Vegetables cooked in cheese, cream, or butter sauce. Fried vegetables. Grains  White bread. White pasta. White rice. Cornbread. Bagels, pastries, and croissants. Crackers and snack foods that contain trans fat and hydrogenated oils. Meats and other protein foods  Fatty cuts of meat. Ribs, chicken wings, bacon, sausage, bologna, salami, chitterlings, fatback, hot dogs, bratwurst, and packaged lunch meats. Liver and organ meats. Whole eggs and egg yolks. Chicken and [place] with skin. Fried meat. Dairy  Whole or 2% milk, cream, half-and-half, and cream cheese. Whole milk cheeses. Whole-fat or sweetened yogurt. Full-fat cheeses. Nondairy creamers and whipped toppings. Processed cheese, cheese spreads, and cheese curds. Beverages  Alcohol. Sugar-sweetened drinks such as sodas, lemonade, and fruit drinks. Fats and oils  Butter, stick margarine, lard, shortening, ghee, or bacon fat. Coconut, palm kernel, and palm oils. Sweets and desserts  Corn syrup, sugars, honey, and molasses. Candy. Jam and jelly. Syrup. Sweetened cereals. Cookies, pies, cakes, donuts, muffins, and ice cream. The items listed above may not be a complete list of foods and beverages you should avoid. Contact a dietitian for more information. Summary  Choosing the right foods helps keep your fat and cholesterol at normal levels. This can keep you from getting  certain diseases.  At meals, fill one-half of your plate with vegetables and green salads.  Eat high-fiber foods, like whole grains, beans, apples, carrots, peas, and barley.  Limit added sugar, saturated fats, alcohol, and fried foods. This information is not intended to replace advice given to you by your health care provider. Make sure you discuss any questions you have with your health care provider. Document Revised: 12/25/2017 Document Reviewed: 01/08/2017 Elsevier Patient Education  [place].  Cholesterol Content in Foods Cholesterol is a waxy, fat-like substance that helps to carry fat in the blood. The body needs cholesterol in small amounts, but too much cholesterol can cause damage to the arteries and heart. Most people should eat less than 200 milligrams (mg) of cholesterol a day. Foods with cholesterol  Cholesterol is found in animal-based foods, such as meat, seafood, and dairy. Generally, low-fat dairy and lean meats have less cholesterol than full-fat dairy and fatty meats. The milligrams of cholesterol per serving (mg per serving) of common cholesterol-containing foods are listed below. Meat and other proteins  Egg -- one large whole egg has 186 mg.  Veal shank -- 4 oz has 141 mg.  Lean ground [place] (93% lean) -- 4 oz has 118 mg.  Fat-trimmed lamb loin -- 4 oz has 106 mg.  Lean ground beef (90% lean) -- 4 oz has 100 mg.  Lobster -- 3.5 oz has 90 mg.  Pork loin chops -- 4 oz has 86 mg.  Canned salmon -- 3.5 oz has 83 mg.  Fat-trimmed beef top loin -- 4 oz has 78 mg.  Frankfurter -- 1 frank (3.5 oz) has 77 mg.  Crab -- 3.5 oz has 71 mg.  Roasted chicken without skin, white meat -- 4 oz has 66 mg.  Light bologna -- 2 oz has 45 mg.  Deli-cut [place] -- 2 oz has 31 mg.  Canned tuna -- 3.5 oz has 31 mg.  Berniece Salines -- 1 oz has 29 mg.  Oysters and mussels (raw) -- 3.5 oz has 25 mg.  Mackerel -- 1 oz has 22 mg.  Trout -- 1 oz has 20 mg.  Pork  sausage -- 1 link (1 oz) has 17 mg.  Salmon -- 1 oz has 16 mg.  Tilapia -- 1 oz has 14 mg. Dairy  Soft-serve ice cream --  cup (4 oz) has 103 mg.  Whole-milk yogurt -- 1 cup (8 oz) has 29 mg.  Cheddar cheese -- 1 oz has 28 mg.  American cheese -- 1 oz has 28 mg.  Whole milk -- 1 cup (8 oz) has 23 mg.  2% milk -- 1 cup (8 oz) has 18 mg.  Cream cheese -- 1 tablespoon (Tbsp) has 15 mg.  Cottage cheese --  cup (4 oz) has 14 mg.  Low-fat (1%) milk -- 1 cup (8 oz) has 10 mg.  Sour cream -- 1 Tbsp has 8.5 mg.  Low-fat yogurt -- 1 cup (8 oz) has 8 mg.  Nonfat Greek yogurt --  1 cup (8 oz) has 7 mg.  Half-and-half cream -- 1 Tbsp has 5 mg. Fats and oils  Cod liver oil -- 1 tablespoon (Tbsp) has 82 mg.  Butter -- 1 Tbsp has 15 mg.  Lard -- 1 Tbsp has 14 mg.  Bacon grease -- 1 Tbsp has 14 mg.  Mayonnaise -- 1 Tbsp has 5-10 mg.  Margarine -- 1 Tbsp has 3-10 mg. Exact amounts of cholesterol in these foods may vary depending on specific ingredients and brands. Foods without cholesterol Most plant-based foods do not have cholesterol unless you combine them with a food that has cholesterol. Foods without cholesterol include:  Grains and cereals.  Vegetables.  Fruits.  Vegetable oils, such as olive, canola, and sunflower oil.  Legumes, such as peas, beans, and lentils.  Nuts and seeds.  Egg whites. Summary  The body needs cholesterol in small amounts, but too much cholesterol can cause damage to the arteries and heart.  Most people should eat less than 200 milligrams (mg) of cholesterol a day. This information is not intended to replace advice given to you by your health care provider. Make sure you discuss any questions you have with your health care provider. Document Revised: 04/05/2017 Document Reviewed: 12/18/2016 Elsevier Patient Education  [place].

## 2019-07-09 NOTE — Chronic Care Management (AMB) (Signed)
Chronic Care Management   Initial Visit Note  07/09/2019 Name: Michelle Fuentes MRN: [id number] DOB: [birth year]  Referred by: Michelle College, NP (Inactive) Reason for referral : Chronic Care Management (Initial: RNCM Chronic Case Management and Care Coordination Needs)   Michelle Fuentes is a 74 y.o. year old female who is a primary care patient of Michelle College, NP (Inactive). The CCM team was consulted for assistance with chronic disease management and care coordination needs related to HLD and Atherosclerosis   Review of patient status, including review of consultants reports, relevant laboratory and other test results, and collaboration with appropriate care team members and the patient's provider was performed as part of comprehensive patient evaluation and provision of chronic care management services.    SDOH (Social Determinants of Health) assessments performed: Yes See Care Plan activities for detailed interventions related to SDOH)  SDOH Interventions     Most Recent Value  SDOH Interventions  SDOH Interventions for the Following Domains  Physical Activity  Physical Activity Interventions  Other (Comments) [The patient is going to start back exercising soon at the gym]       Medications: Outpatient Encounter Medications as of 07/09/2019  Medication Sig Note  . Cholecalciferol 1000 UNITS capsule Take 1,000 Units by mouth daily.  07/29/2014: Received from: [facility]  . diazepam (VALIUM) 2 MG tablet Take 2 mg by mouth every 6 (six) hours as needed.  07/29/2014: Medication taken as needed.  Received from: [facility]  . fluticasone (FLONASE) 50 MCG/ACT nasal spray SPRAY 2 SPRAYS INTO EACH NOSTRIL EVERY DAY   . levothyroxine (SYNTHROID) 88 MCG tablet TAKE 1 TABLET (88 MCG TOTAL) BY MOUTH DAILY BEFORE BREAKFAST.   Marland Kitchen loratadine (CLARITIN) 10 MG tablet Take 10 mg daily by mouth.   . Ginkgo Biloba Extract 60 MG CAPS Take 120 mg by  mouth.   . Lutein 6 MG CAPS Take 10 mg by mouth daily.  07/29/2014: Received from: [facility]  . Magnesium Gluconate 250 MG TABS Take 250 mg by mouth daily.  07/29/2014: Received from: [facility]  . meclizine (ANTIVERT) 25 MG tablet Take 25 mg by mouth once as needed.  07/29/2014: Medication taken as needed.  Received from: [facility]  . omeprazole (PRILOSEC) 20 MG capsule Take 20 mg by mouth daily.   . predniSONE (DELTASONE) 10 MG tablet Take 10 mg by mouth daily. 05/26/2019: Once a week   . triamcinolone cream (KENALOG) 0.5 % Apply 1 application topically 2 (two) times daily. To affected areas, for up to 2 weeks. (Patient not taking: Reported on 05/26/2019)   . triamterene-hydrochlorothiazide (DYAZIDE) 37.5-25 MG per capsule Take 1 capsule by mouth daily.  07/29/2014: Received from: [facility]   No facility-administered encounter medications on file as of 07/09/2019.     Objective:  Lab Results  Component Value Date   CHOL 245 (H) 04/22/2018   HDL 61 04/22/2018   LDLCALC 156 (H) 04/22/2018   TRIG 150 (H) 04/22/2018   CHOLHDL 4.0 04/22/2018    Goals Addressed            This Visit's Progress   . RNCM: Pt-"I want to know how to lowermy cholestorl without using statins" (pt-stated)       CARE PLAN ENTRY (see longtitudinal plan of care for additional care plan information)  Current Barriers:  . Chronic Disease Management support, education, and care coordination needs related to HLD and Atherosclerosis  Clinical Goal(s) related  to HLD and Atherosclerosis :  Over the next 120 days, patient will:  . Work with the care management team to address educational, disease management, and care coordination needs  . Begin or continue self health monitoring activities as directed today  adhere to a Heart Healthy Diet . Call provider office for new or worsened signs and symptoms New or worsened symptom related to HLD and  Atherosclerosis   . Call care management team with questions or concerns . Verbalize basic understanding of patient centered plan of care established today  Interventions related to HLD and Atherosclerosis  :  . Evaluation of current treatment plans and patient's adherence to plan as established by provider . Assessed patient understanding of disease states . Assessed patient's education and care coordination needs . Provided disease specific education to patient- provided the patient with low cholesterol food options. The patient does not wish to take statins and wants natural ways to reduce cholesterol.  . Education about the My Chart functionality for education and viewing reports. Also the EMMI program to send information to the patient by. Will send information on lowering cholesterol via Emmi  . Collaborated with appropriate clinical care team members regarding patient needs  Patient Self Care Activities related to HLD and Athersclerosis :  . Patient is unable to independently self-manage chronic health conditions  Initial goal documentation         Michelle Fuentes was given information about Chronic Care Management services today including:  1. CCM service includes personalized support from designated clinical staff supervised by her physician, including individualized plan of care and coordination with other care providers 2. 24/7 contact phone numbers for assistance for urgent and routine care needs. 3. Service will only be billed when office clinical staff spend 20 minutes or more in a month to coordinate care. 4. Only one practitioner may furnish and bill the service in a calendar month. 5. The patient may stop CCM services at any time (effective at the end of the month) by phone call to the office staff. 6. The patient will be responsible for cost sharing (co-pay) of up to 20% of the service fee (after annual deductible is met).  Patient agreed to services and verbal consent  obtained.   Plan:   Telephone follow up appointment with care management team member scheduled for: 09-30-2019 at [place], MSN, [place] Mobile: [phone]

## 2019-09-10 ENCOUNTER — Telehealth: Payer: Self-pay

## 2019-09-10 ENCOUNTER — Ambulatory Visit: Payer: Self-pay | Admitting: General Practice

## 2019-09-10 NOTE — Chronic Care Management (AMB) (Signed)
°  Chronic Care Management   Outreach Note  09/10/2019 Name: Michelle Fuentes MRN: [id number] DOB: Jun 25, 1945  Referred by: Mikey College, NP (Inactive) Reason for referral : Chronic Care Management (Follow up: HLD/Atheroscleroisis)   An unsuccessful telephone outreach was attempted today. The patient was referred to the case management team for assistance with care management and care coordination.   Follow Up Plan: A HIPPA compliant phone message was left for the patient providing contact information and requesting a return call.   Noreene Larsson RN, MSN, [place] Mobile: [phone]

## 2019-09-24 ENCOUNTER — Telehealth: Payer: Self-pay

## 2019-09-24 ENCOUNTER — Ambulatory Visit: Payer: Self-pay | Admitting: General Practice

## 2019-09-24 NOTE — Chronic Care Management (AMB) (Signed)
  Chronic Care Management   Outreach Note  09/24/2019 Name: Michelle Fuentes MRN: [id number] DOB: Oct 09, 1945  Referred by: Mikey College, NP (Inactive) Reason for referral : Chronic Care Management (Follow up Call: 2nd attempt for HLD/Atherosclerosis and new concerns)   A second unsuccessful telephone outreach was attempted today. The patient was referred to the case management team for assistance with care management and care coordination.   Follow Up Plan: A HIPPA compliant phone message was left for the patient providing contact information and requesting a return call.   Noreene Larsson RN, MSN, [place] Mobile: [phone]

## 2019-10-20 ENCOUNTER — Other Ambulatory Visit: Payer: Self-pay | Admitting: Family Medicine

## 2019-10-20 ENCOUNTER — Telehealth: Payer: Self-pay

## 2019-10-20 DIAGNOSIS — E039 Hypothyroidism, unspecified: Secondary | ICD-10-CM

## 2019-10-20 DIAGNOSIS — Z862 Personal history of diseases of the blood and blood-forming organs and certain disorders involving the immune mechanism: Secondary | ICD-10-CM

## 2019-10-20 DIAGNOSIS — R7989 Other specified abnormal findings of blood chemistry: Secondary | ICD-10-CM

## 2019-10-20 DIAGNOSIS — E785 Hyperlipidemia, unspecified: Secondary | ICD-10-CM

## 2019-10-20 DIAGNOSIS — Z0000 Encounter for general adult medical examination without abnormal findings: Secondary | ICD-10-CM

## 2019-10-20 DIAGNOSIS — D509 Iron deficiency anemia, unspecified: Secondary | ICD-10-CM

## 2019-10-20 NOTE — Telephone Encounter (Signed)
PE labs placed.  If patient is interested in Hepatitis C screening (we can have done off the same tubes that are being drawn already for her).  Typically I discuss at their office visit so they aren't stuck twice.  If she wants, we can add on. Thanks

## 2019-10-20 NOTE — Telephone Encounter (Signed)
Copied from [place] [id number]. Topic: Appointment Scheduling - Scheduling Inquiry for Clinic >> Oct 20, 2019  3:13 PM Sharene Skeans wrote: Reason for CRM: Pt scheduled CPE for Monday and would like to come in this thurs or Fri for labs/ please advise

## 2019-10-20 NOTE — Progress Notes (Signed)
PE labs placed

## 2019-10-21 ENCOUNTER — Other Ambulatory Visit: Payer: Self-pay

## 2019-10-21 ENCOUNTER — Ambulatory Visit
Admission: RE | Admit: 2019-10-21 | Discharge: 2019-10-21 | Disposition: A | Discharge status: Home / Self Care | Payer: Medicare HMO | Source: Ambulatory Visit | Attending: Family Medicine | Admitting: Family Medicine

## 2019-10-21 DIAGNOSIS — Z1231 Encounter for screening mammogram for malignant neoplasm of breast: Secondary | ICD-10-CM | POA: Insufficient documentation

## 2019-10-22 ENCOUNTER — Other Ambulatory Visit: Payer: Medicare HMO

## 2019-10-22 DIAGNOSIS — Z862 Personal history of diseases of the blood and blood-forming organs and certain disorders involving the immune mechanism: Secondary | ICD-10-CM | POA: Diagnosis not present

## 2019-10-22 DIAGNOSIS — E785 Hyperlipidemia, unspecified: Secondary | ICD-10-CM | POA: Diagnosis not present

## 2019-10-22 DIAGNOSIS — E039 Hypothyroidism, unspecified: Secondary | ICD-10-CM | POA: Diagnosis not present

## 2019-10-22 DIAGNOSIS — Z0000 Encounter for general adult medical examination without abnormal findings: Secondary | ICD-10-CM | POA: Diagnosis not present

## 2019-10-22 DIAGNOSIS — R7989 Other specified abnormal findings of blood chemistry: Secondary | ICD-10-CM | POA: Diagnosis not present

## 2019-10-22 DIAGNOSIS — D509 Iron deficiency anemia, unspecified: Secondary | ICD-10-CM | POA: Diagnosis not present

## 2019-10-23 LAB — COMPLETE METABOLIC PANEL WITH GFR
AG Ratio: 1.7 (calc) (ref 1.0–2.5)
ALT: 10 U/L (ref 6–29)
AST: 11 U/L (ref 10–35)
Albumin: 4.3 g/dL (ref 3.6–5.1)
Alkaline phosphatase (APISO): 60 U/L (ref 37–153)
BUN: 20 mg/dL (ref 7–25)
CO2: 30 mmol/L (ref 20–32)
Calcium: 9.9 mg/dL (ref 8.6–10.4)
Chloride: 100 mmol/L (ref 98–110)
Creat: 0.77 mg/dL (ref 0.60–0.93)
GFR, Est African American: 89 mL/min/{1.73_m2} (ref >=60)
GFR, Est Non African American: 77 mL/min/{1.73_m2} (ref >=60)
Globulin: 2.6 g/dL (calc) (ref 1.9–3.7)
Glucose, Bld: 88 mg/dL (ref 65–99)
Potassium: 3.9 mmol/L (ref 3.5–5.3)
Sodium: 139 mmol/L (ref 135–146)
Total Bilirubin: 0.5 mg/dL (ref 0.2–1.2)
Total Protein: 6.9 g/dL (ref 6.1–8.1)

## 2019-10-23 LAB — CBC WITH DIFFERENTIAL/PLATELET
Absolute Monocytes: 720 cells/uL (ref 200–950)
Basophils Absolute: 53 cells/uL (ref 0–200)
Basophils Relative: 0.7 %
Eosinophils Absolute: 248 cells/uL (ref 15–500)
Eosinophils Relative: 3.3 %
HCT: 43.5 % (ref 35.0–45.0)
Hemoglobin: 14.7 g/dL (ref 11.7–15.5)
Lymphs Abs: 1628 cells/uL (ref 850–3900)
MCH: 29.1 pg (ref 27.0–33.0)
MCHC: 33.8 g/dL (ref 32.0–36.0)
MCV: 86 fL (ref 80.0–100.0)
MPV: 10.5 fL (ref 7.5–12.5)
Monocytes Relative: 9.6 %
Neutro Abs: 4853 cells/uL (ref 1500–7800)
Neutrophils Relative %: 64.7 %
Platelets: 391 10*3/uL (ref 140–400)
RBC: 5.06 10*6/uL (ref 3.80–5.10)
RDW: 13 % (ref 11.0–15.0)
Total Lymphocyte: 21.7 %
WBC: 7.5 10*3/uL (ref 3.8–10.8)

## 2019-10-23 LAB — LIPID PANEL
Cholesterol: 253 mg/dL — ABNORMAL HIGH (ref <200)
HDL: 66 mg/dL (ref >=50)
LDL Cholesterol (Calc): 164 mg/dL (calc) — ABNORMAL HIGH
Non-HDL Cholesterol (Calc): 187 mg/dL (calc) — ABNORMAL HIGH (ref <130)
Total CHOL/HDL Ratio: 3.8 (calc) (ref <5.0)
Triglycerides: 114 mg/dL (ref <150)

## 2019-10-23 LAB — THYROID PANEL WITH TSH
Free Thyroxine Index: 4.5 — ABNORMAL HIGH (ref 1.4–3.8)
T3 Uptake: 33 % (ref 22–35)
T4, Total: 13.7 ug/dL — ABNORMAL HIGH (ref 5.1–11.9)
TSH: 0.33 mIU/L — ABNORMAL LOW (ref 0.40–4.50)

## 2019-10-26 ENCOUNTER — Encounter: Payer: Medicare HMO | Admitting: Family Medicine

## 2019-10-28 ENCOUNTER — Ambulatory Visit (INDEPENDENT_AMBULATORY_CARE_PROVIDER_SITE_OTHER): Payer: Medicare HMO | Admitting: Family Medicine

## 2019-10-28 ENCOUNTER — Encounter: Payer: Self-pay | Admitting: Family Medicine

## 2019-10-28 ENCOUNTER — Other Ambulatory Visit: Payer: Self-pay

## 2019-10-28 VITALS — BP 130/54 | HR 71 | Temp 97.1°F | Ht 65.5 in | Wt 157.6 lb

## 2019-10-28 DIAGNOSIS — Z0000 Encounter for general adult medical examination without abnormal findings: Secondary | ICD-10-CM

## 2019-10-28 DIAGNOSIS — E039 Hypothyroidism, unspecified: Secondary | ICD-10-CM

## 2019-10-28 DIAGNOSIS — J301 Allergic rhinitis due to pollen: Secondary | ICD-10-CM | POA: Diagnosis not present

## 2019-10-28 DIAGNOSIS — N8110 Cystocele, unspecified: Secondary | ICD-10-CM

## 2019-10-28 DIAGNOSIS — R8290 Unspecified abnormal findings in urine: Secondary | ICD-10-CM

## 2019-10-28 LAB — POCT URINALYSIS DIPSTICK
Bilirubin, UA: NEGATIVE
Blood, UA: NEGATIVE
Glucose, UA: NEGATIVE
Ketones, UA: NEGATIVE
Nitrite, UA: NEGATIVE
Protein, UA: POSITIVE — AB
Spec Grav, UA: 1.02 (ref 1.010–1.025)
Urobilinogen, UA: 0.2 E.U./dL
pH, UA: 5 (ref 5.0–8.0)

## 2019-10-28 MED ORDER — LEVOTHYROXINE SODIUM 75 MCG PO TABS: 75.0000 ug | ORAL_TABLET | Freq: Every day | ORAL | 3 refills | Status: DC

## 2019-10-28 MED ORDER — FLUTICASONE PROPIONATE 50 MCG/ACT NA SUSP: NASAL | 3 refills | Status: DC

## 2019-10-28 NOTE — Assessment & Plan Note (Signed)
Continued urinary incontinence with usage of poise pads.  Discussed meeting with GYN for pessary trial/discussion.  Plan: 1. Discussed option of meeting with GYN for possible pessary management.  Patient will think about this and let us know 2. Follow up as needed and in 1 year.

## 2019-10-28 NOTE — Assessment & Plan Note (Signed)
Annual physical exam without new findings.  Well adult with no acute concerns.  Plan: 1. Obtain health maintenance screenings as above according to age. - Increase physical activity to 30 minutes most days of the week.  - Eat healthy diet high in vegetables and fruits; low in refined carbohydrates. - Screening labs and tests as ordered 2. Return 1 year for annual physical.  

## 2019-10-28 NOTE — Patient Instructions (Signed)
I have sent in refills on your flonase to your pharmacy on file.  A new prescription for Levothyroxine 69mcg has been sent to your pharmacy.  STOP the 72mcg dosing.  We will plan to repeat your thyroid labs in 6-8 weeks to see how your thyroid is responding to the lower dosage.  As we discussed, I can send a referral to the OB/GYN to discuss pessary management of your urinary symptoms.  If you decide you'd like to pursue this, please let us know.  Well Visit: Care Instructions Overview  Well visits can help you stay healthy. Your provider has checked your overall health and may have suggested ways to take good care of yourself. Your provider also may have recommended tests. At home, you can help prevent illness with healthy eating, regular exercise, and other steps.  Follow-up care is a key part of your treatment and safety. Be sure to make and go to all appointments, and call your provider if you are having problems. It's also a good idea to know your test results and keep a list of the medicines you take.  How can you care for yourself at home?   Get screening tests that you and your doctor decide on. Screening helps find diseases before any symptoms appear.   Eat healthy foods. Choose fruits, vegetables, whole grains, protein, and low-fat dairy foods. Limit fat, especially saturated fat. Reduce salt in your diet.   Limit alcohol. If you are a man, have no more than 2 drinks a day or 14 drinks a week. If you are a woman, have no more than 1 drink a day or 7 drinks a week.   Get at least 30 minutes of physical activity on most days of the week.  We recommend you go no more than 2 days in a row without exercise. Walking is a good choice. You also may want to do other activities, such as running, swimming, cycling, or playing tennis or team sports. Discuss any changes in your exercise program with your provider.   Reach and stay at a healthy weight. This will lower your risk for many  problems, such as obesity, diabetes, heart disease, and high blood pressure.   Do not smoke or allow others to smoke around you. If you need help quitting, talk to your provider about stop-smoking programs and medicines. These can increase your chances of quitting for good.  Can call 1-800-QUIT-NOW 864 387 9131) for the [facility], assistance with smoking cessation.   Care for your mental health. It is easy to get weighed down by worry and stress. Learn strategies to manage stress, like deep breathing and mindfulness, and stay connected with your family and community. If you find you often feel sad or hopeless, talk with your provider. Treatment can help.   Talk to your provider about whether you have any risk factors for sexually transmitted infections (STIs). You can help prevent STIs if you wait to have sex with a new partner (or partners) until you've each been tested for STIs. It also helps if you use condoms (female or female condoms) and if you limit your sex partners to one person who only has sex with you. Vaccines are available for some STIs, such as HPV (these are age dependent).   Use birth control if it's important to you to prevent pregnancy. Talk with your provider about the choices available and what might be best for you.   If you think you may have a problem with alcohol  or drug use, talk to your provider. This includes prescription medicines (such as amphetamines and opioids) and illegal drugs (such as cocaine and methamphetamine). Your provider can help you figure out what type of treatment is best for you.   If you have concerns about domestic violence or intimate partner violence, there are resources available to you. National Domestic Abuse Hotline (901)192-8671   Protect your skin from too much sun. When you're outdoors from 10 a.m. to 4 p.m., stay in the shade or cover up with clothing and a hat with a wide brim. Wear sunglasses that block UV rays. Even when  it's cloudy, put broad-spectrum sunscreen (SPF 30 or higher) on any exposed skin.   See a dentist one or two times a year for checkups and to have your teeth cleaned.   See an eye doctor once per year for an eye exam.   Wear a seat belt in the car.  When should you call for help?  Watch closely for changes in your health, and be sure to contact your provider if you have any problems or symptoms that concern you.  We will plan to see you back in 2 months for hypothyroidism follow up visit  You will receive a survey after today's visit either digitally by e-mail or paper by [place] mail. Your experiences and feedback matter to [name].  Please respond so we know how we are doing as we provide care for you.  Call us with any questions/concerns/needs.  It is my goal to be available to you for your health concerns.  Thanks for choosing me to be a partner in your healthcare needs!  Harlin Rain, FNP-C Family Nurse Practitioner [facility] Phone: [phone]

## 2019-10-28 NOTE — Progress Notes (Signed)
Subjective:    Patient ID: Michelle Fuentes, female    DOB: 1945/06/06, 74 y.o.   MRN: [id number]  Michelle Fuentes is a 74 y.o. female presenting on 10/28/2019 for Annual Exam   HPI   HEALTH MAINTENANCE:  Weight/BMI: Overweight, BMI 25.83 Physical activity: Stays active Diet: Regular Seatbelt: Yes Sunscreen: Skin care products have sunscreen in them, does not stay in sun prolonged periods of time Mammogram: Completed 10/21/2019 DEXA: Completed 07/16/2018 Colon cancer screening: Reports completed in 2015 Optometry: Once per year Dentistry: Twice per year  IMMUNIZATIONS: Influenza: Due next season Tetanus: Due COVID: Discussed  Depression screen [facility] 2/9 07/09/2019 05/26/2019 03/11/2019  Decreased Interest 0 0 0  Down, Depressed, Hopeless 0 1 0  PHQ - 2 Score 0 1 0    Past Medical History:  Diagnosis Date  . GERD (gastroesophageal reflux disease)   . History of TIA (transient ischemic attack)   . Hypothyroidism   . Iron deficiency anemia   . Meniere disease   . Mixed hyperlipidemia   . Osteopenia   . Seasonal allergies    Past Surgical History:  Procedure Laterality Date  . eye surgery    . mole removed    . TONSILLECTOMY     Social History   Socioeconomic History  . Marital status: Married    Spouse name: Not on file  . Number of children: Not on file  . Years of education: masters  . Highest education level: Master's degree (e.g., MA, MS, MEng, MEd, MSW, MBA)  Occupational History  . Not on file  Tobacco Use  . Smoking status: Never Smoker  . Smokeless tobacco: Never Used  Vaping Use  . Vaping Use: Never used  Substance and Sexual Activity  . Alcohol use: Yes    Comment: rare ( wine- maybe once a year )  . Drug use: No  . Sexual activity: Yes    Birth control/protection: Post-menopausal  Other Topics Concern  . Not on file  Social History Narrative  . Not on file   Social Determinants of Health   Financial Resource Strain: Low Risk   .  Difficulty of Paying Living Expenses: Not hard at all  Food Insecurity: No Food Insecurity  . Worried About Charity fundraiser in the Last Year: Never true  . Ran Out of Food in the Last Year: Never true  Transportation Needs: No Transportation Needs  . Lack of Transportation (Medical): No  . Lack of Transportation (Non-Medical): No  Physical Activity: Insufficiently Active  . Days of Exercise per Week: 2 days  . Minutes of Exercise per Session: 30 min  Stress: No Stress Concern Present  . Feeling of Stress : Not at all  Social Connections:   . Frequency of Communication with Friends and Family:   . Frequency of Social Gatherings with Friends and Family:   . Attends Religious Services:   . Active Member of Clubs or Organizations:   . Attends Archivist Meetings:   Marland Kitchen Marital Status:   Intimate Partner Violence: Not At Risk  . Fear of Current or Ex-Partner: No  . Emotionally Abused: No  . Physically Abused: No  . Sexually Abused: No   Family History  Problem Relation Age of Onset  . Hypertension Father   . Lung cancer Father   . Lymphoma Brother   . Breast cancer Maternal Grandmother 80  . Melanoma Brother   . Stroke Mother   . Ovarian cancer Neg Hx   .  Colon cancer Neg Hx   . Diabetes Neg Hx   . Heart disease Neg Hx    Current Outpatient Medications on File Prior to Visit  Medication Sig  . Cholecalciferol 1000 UNITS capsule Take 1,000 Units by mouth daily.   . diazepam (VALIUM) 2 MG tablet Take 2 mg by mouth every 6 (six) hours as needed.   . loratadine (CLARITIN) 10 MG tablet Take 10 mg daily by mouth.  . Lutein 6 MG CAPS Take 10 mg by mouth daily.   . meclizine (ANTIVERT) 25 MG tablet Take 25 mg by mouth once as needed.   Marland Kitchen omeprazole (PRILOSEC) 20 MG capsule Take 20 mg by mouth daily.  . predniSONE (DELTASONE) 10 MG tablet Take 10 mg by mouth once a week.   . triamcinolone cream (KENALOG) 0.5 % Apply 1 application topically 2 (two) times daily. To affected  areas, for up to 2 weeks.  . triamterene-hydrochlorothiazide (DYAZIDE) 37.5-25 MG per capsule Take 1 capsule by mouth daily.   . Ginkgo Biloba Extract 60 MG CAPS Take 120 mg by mouth. (Patient not taking: Reported on 10/28/2019)  . Magnesium Gluconate 250 MG TABS Take 250 mg by mouth daily.  (Patient not taking: Reported on 10/28/2019)   No current facility-administered medications on file prior to visit.    Per HPI unless specifically indicated above     Objective:    BP (!) 130/54 (BP Location: Left Arm, Patient Position: Sitting, Cuff Size: Normal)   Pulse 71   Temp (!) 97.1 F (36.2 C) (Temporal)   Ht 5' 5.5" (1.664 m)   Wt 157 lb 9.6 oz (71.5 kg)   BMI 25.83 kg/m   Wt Readings from Last 3 Encounters:  10/28/19 157 lb 9.6 oz (71.5 kg)  05/26/19 154 lb 12.8 oz (70.2 kg)  03/11/19 159 lb (72.1 kg)    Physical Exam Vitals reviewed.  Constitutional:      General: She is not in acute distress.    Appearance: Normal appearance. She is well-developed, well-groomed and overweight. She is not ill-appearing or toxic-appearing.  HENT:     Head: Normocephalic and atraumatic.     Right Ear: Tympanic membrane, ear canal and external ear normal. There is no impacted cerumen.     Left Ear: Tympanic membrane, ear canal and external ear normal. There is no impacted cerumen.     Nose: Nose normal. No congestion or rhinorrhea.     Mouth/Throat:     Lips: Pink.     Mouth: Mucous membranes are moist.     Pharynx: Oropharynx is clear. Uvula midline. No oropharyngeal exudate or posterior oropharyngeal erythema.  Eyes:     General: Lids are normal. Vision grossly intact. No scleral icterus.       Right eye: No discharge.        Left eye: No discharge.     Extraocular Movements: Extraocular movements intact.     Conjunctiva/sclera: Conjunctivae normal.     Pupils: Pupils are equal, round, and reactive to light.  Neck:     Thyroid: No thyroid mass, thyromegaly or thyroid tenderness.    Cardiovascular:     Rate and Rhythm: Normal rate and regular rhythm.     Pulses: Normal pulses.          Dorsalis pedis pulses are 2+ on the right side and 2+ on the left side.     Heart sounds: Normal heart sounds. No murmur heard.  No friction rub. No gallop.   Pulmonary:  Effort: Pulmonary effort is normal. No respiratory distress.     Breath sounds: Normal breath sounds.  Abdominal:     General: Abdomen is flat. Bowel sounds are normal. There is no distension.     Palpations: Abdomen is soft. There is no hepatomegaly, splenomegaly or mass.     Tenderness: There is no abdominal tenderness. There is no guarding or rebound.     Hernia: No hernia is present.  Musculoskeletal:        General: Normal range of motion.     Cervical back: Normal range of motion and neck supple. No tenderness.     Right lower leg: No edema.     Left lower leg: No edema.     Comments: Normal tone, strength 5/5 BUE & BLE  Feet:     Right foot:     Skin integrity: Skin integrity normal.     Left foot:     Skin integrity: Skin integrity normal.  Lymphadenopathy:     Cervical: No cervical adenopathy.  Skin:    General: Skin is warm and dry.     Capillary Refill: Capillary refill takes less than 2 seconds.  Neurological:     General: No focal deficit present.     Mental Status: She is alert and oriented to person, place, and time.     Cranial Nerves: No cranial nerve deficit.     Sensory: No sensory deficit.     Motor: No weakness.     Coordination: Coordination normal.     Gait: Gait normal.     Deep Tendon Reflexes: Reflexes normal.  Psychiatric:        Attention and Perception: Attention and perception normal.        Mood and Affect: Mood and affect normal.        Speech: Speech normal.        Behavior: Behavior normal. Behavior is cooperative.        Thought Content: Thought content normal.        Cognition and Memory: Cognition and memory normal.        Judgment: Judgment normal.      Results for orders placed or performed in visit on 10/20/19  CBC with Differential  Result Value Ref Range   WBC 7.5 3.8 - 10.8 Thousand/uL   RBC 5.06 3.80 - 5.10 Million/uL   Hemoglobin 14.7 11.7 - 15.5 g/dL   HCT 43.5 35 - 45 %   MCV 86.0 80.0 - 100.0 fL   MCH 29.1 27.0 - 33.0 pg   MCHC 33.8 32.0 - 36.0 g/dL   RDW 13.0 11.0 - 15.0 %   Platelets 391 140 - 400 Thousand/uL   MPV 10.5 7.5 - 12.5 fL   Neutro Abs 4,853 1,500 - 7,800 cells/uL   Lymphs Abs 1,628 850 - 3,900 cells/uL   Absolute Monocytes 720 200 - 950 cells/uL   Eosinophils Absolute 248 15 - 500 cells/uL   Basophils Absolute 53 0 - 200 cells/uL   Neutrophils Relative % 64.7 %   Total Lymphocyte 21.7 %   Monocytes Relative 9.6 %   Eosinophils Relative 3.3 %   Basophils Relative 0.7 %  COMPLETE METABOLIC PANEL WITH GFR  Result Value Ref Range   Glucose, Bld 88 65 - 99 mg/dL   BUN 20 7 - 25 mg/dL   Creat 0.77 0.60 - 0.93 mg/dL   GFR, Est Non African American 77 > OR = 60 mL/min/1.110m2   GFR, Est African American 89 >  OR = 60 mL/min/1.52m2   BUN/Creatinine Ratio NOT APPLICABLE 6 - 22 (calc)   Sodium 139 135 - 146 mmol/L   Potassium 3.9 3.5 - 5.3 mmol/L   Chloride 100 98 - 110 mmol/L   CO2 30 20 - 32 mmol/L   Calcium 9.9 8.6 - 10.4 mg/dL   Total Protein 6.9 6.1 - 8.1 g/dL   Albumin 4.3 3.6 - 5.1 g/dL   Globulin 2.6 1.9 - 3.7 g/dL (calc)   AG Ratio 1.7 1.0 - 2.5 (calc)   Total Bilirubin 0.5 0.2 - 1.2 mg/dL   Alkaline phosphatase (APISO) 60 37 - 153 U/L   AST 11 10 - 35 U/L   ALT 10 6 - 29 U/L  Lipid Profile  Result Value Ref Range   Cholesterol 253 (H) <200 mg/dL   HDL 66 > OR = 50 mg/dL   Triglycerides 114 <150 mg/dL   LDL Cholesterol (Calc) 164 (H) mg/dL (calc)   Total CHOL/HDL Ratio 3.8 <5.0 (calc)   Non-HDL Cholesterol (Calc) 187 (H) <130 mg/dL (calc)  Thyroid Panel With TSH  Result Value Ref Range   T3 Uptake 33 22 - 35 %   T4, Total 13.7 (H) 5.1 - 11.9 mcg/dL   Free Thyroxine Index 4.5 (H) 1.4 - 3.8    TSH 0.33 (L) 0.40 - 4.50 mIU/L      Assessment & Plan:   Problem List Items Addressed This Visit      Endocrine   Adult hypothyroidism - Primary    TSH decreased below normal limits, will decrease levothyroxine dose from 85mcg daily to 13mcg and repeat thyroid function labs in 6-8 weeks.  Plan: 1. BEGIN Levothyroxine 79mcg daily and STOP levothyroxine 62mcg daily 2. Repeat labs in 6-8 weeks 3. Follow up in 8 weeks      Relevant Medications   levothyroxine (SYNTHROID) 75 MCG tablet   Other Relevant Orders   Thyroid Panel With TSH     Genitourinary   Bladder prolapse, female, acquired    Continued urinary incontinence with usage of poise pads.  Discussed meeting with GYN for pessary trial/discussion.  Plan: 1. Discussed option of meeting with GYN for possible pessary management.  Patient will think about this and let us know 2. Follow up as needed and in 1 year.        Other   Routine medical exam    Annual physical exam without new findings.  Well adult with no acute concerns.  Plan: 1. Obtain health maintenance screenings as above according to age. - Increase physical activity to 30 minutes most days of the week.  - Eat healthy diet high in vegetables and fruits; low in refined carbohydrates. - Screening labs and tests as ordered 2. Return 1 year for annual physical.       Relevant Orders   POCT Urinalysis Dipstick    Other Visit Diagnoses    Chronic seasonal allergic rhinitis due to pollen       Relevant Medications   fluticasone (FLONASE) 50 MCG/ACT nasal spray      Meds ordered this encounter  Medications  . levothyroxine (SYNTHROID) 75 MCG tablet    Sig: Take 1 tablet (75 mcg total) by mouth daily.    Dispense:  90 tablet    Refill:  3  . fluticasone (FLONASE) 50 MCG/ACT nasal spray    Sig: SPRAY 2 SPRAYS INTO EACH NOSTRIL EVERY DAY    Dispense:  48 g    Refill:  3  Follow up plan: Return in about 2 months (around 12/28/2019) for  Hypothyroid follow up visit.  Harlin Rain, FNP-C Family Nurse Practitioner [facility] 10/28/2019, 11:45 AM

## 2019-10-28 NOTE — Assessment & Plan Note (Signed)
TSH decreased below normal limits, will decrease levothyroxine dose from 55mcg daily to 96mcg and repeat thyroid function labs in 6-8 weeks.  Plan: 1. BEGIN Levothyroxine 71mcg daily and STOP levothyroxine 19mcg daily 2. Repeat labs in 6-8 weeks 3. Follow up in 8 weeks

## 2019-10-30 LAB — URINE CULTURE
MICRO NUMBER:: 10629814
Result:: NO GROWTH
SPECIMEN QUALITY:: ADEQUATE

## 2019-11-04 ENCOUNTER — Other Ambulatory Visit: Payer: Self-pay | Admitting: Family Medicine

## 2019-11-04 DIAGNOSIS — E039 Hypothyroidism, unspecified: Secondary | ICD-10-CM

## 2019-11-16 ENCOUNTER — Ambulatory Visit: Payer: Self-pay | Admitting: General Practice

## 2019-11-16 ENCOUNTER — Telehealth: Payer: Self-pay

## 2019-11-16 NOTE — Chronic Care Management (AMB) (Signed)
  Chronic Care Management   Outreach Note  11/16/2019 Name: Michelle Fuentes MRN: [id number] DOB: 04-27-1946  Referred by: Verl Bangs, FNP Reason for referral : Chronic Care Management (RNCM Follow up: 3rd attempt: Chronic disease management and care coordination needs)   Third unsuccessful telephone outreach was attempted today. The patient was referred to the case management team for assistance with care management and care coordination. The patient's primary care provider has been notified of our unsuccessful attempts to make or maintain contact with the patient. The care management team is pleased to engage with this patient at any time in the future should he/she be interested in assistance from the care management team.   Follow Up Plan: The care management team is available to follow up with the patient after provider conversation with the patient regarding recommendation for care management engagement and subsequent re-referral to the care management team.   Noreene Larsson RN, MSN, [facility] Mobile: [phone]

## 2019-12-01 ENCOUNTER — Encounter: Payer: Self-pay | Admitting: Family Medicine

## 2019-12-22 ENCOUNTER — Telehealth: Payer: Self-pay

## 2019-12-22 ENCOUNTER — Other Ambulatory Visit: Payer: Self-pay | Admitting: Family Medicine

## 2019-12-22 DIAGNOSIS — U071 COVID-19: Secondary | ICD-10-CM

## 2019-12-22 DIAGNOSIS — R059 Cough, unspecified: Secondary | ICD-10-CM

## 2019-12-22 DIAGNOSIS — R05 Cough: Secondary | ICD-10-CM

## 2019-12-22 MED ORDER — BENZONATATE 100 MG PO CAPS: 100.0000 mg | ORAL_CAPSULE | Freq: Two times a day (BID) | ORAL | 0 refills | Status: DC | PRN

## 2019-12-22 NOTE — Telephone Encounter (Signed)
Patient's husband had appointment last week who had exposure to Covid, they both were symptomatic and their test came back positive.She was requesting if Elmyra Ricks can Rx TESSALON for her cough her Sxs were less than her husband. They both refused for infusion therapy.

## 2019-12-22 NOTE — Telephone Encounter (Signed)
Copied from [place] [id number]. Topic: General - Inquiry >> Dec 21, 2019  3:03 PM Alease Frame wrote: Reason for CRM: Pt is pos for covid and wanting medication for symptoms . Please reach out to pt

## 2019-12-22 NOTE — Telephone Encounter (Signed)
Sent in Rx to patient's pharmacy

## 2019-12-23 NOTE — Telephone Encounter (Signed)
Patient informed. 

## 2020-01-13 ENCOUNTER — Other Ambulatory Visit: Payer: Self-pay | Admitting: Family Medicine

## 2020-01-13 DIAGNOSIS — R059 Cough, unspecified: Secondary | ICD-10-CM

## 2020-01-14 ENCOUNTER — Telehealth: Payer: Self-pay

## 2020-01-14 NOTE — Telephone Encounter (Signed)
Copied from [place] [id number]. Topic: General - Inquiry >> Jan 13, 2020  4:10 PM Gillis Ends D wrote: Reason for CRM: Can you give this patient a return call she has several questions about her tyroid. Please call [phone]. Please advise the patient.    Attempted to f/u with the patient, no answer. LMOM to return my call.

## 2020-01-20 ENCOUNTER — Encounter: Payer: Medicare HMO | Admitting: Family Medicine

## 2020-01-20 DIAGNOSIS — R69 Illness, unspecified: Secondary | ICD-10-CM | POA: Diagnosis not present

## 2020-01-20 NOTE — Telephone Encounter (Signed)
Appt scheduled for tomorrow with Elmyra Ricks.

## 2020-01-21 ENCOUNTER — Other Ambulatory Visit: Payer: Self-pay

## 2020-01-21 ENCOUNTER — Encounter: Payer: Self-pay | Admitting: Family Medicine

## 2020-01-21 ENCOUNTER — Ambulatory Visit (INDEPENDENT_AMBULATORY_CARE_PROVIDER_SITE_OTHER): Payer: Medicare HMO | Admitting: Family Medicine

## 2020-01-21 VITALS — BP 123/57 | HR 77 | Temp 98.1°F | Resp 18 | Ht 65.5 in | Wt 157.2 lb

## 2020-01-21 DIAGNOSIS — Z0184 Encounter for antibody response examination: Secondary | ICD-10-CM | POA: Diagnosis not present

## 2020-01-21 DIAGNOSIS — E039 Hypothyroidism, unspecified: Secondary | ICD-10-CM | POA: Diagnosis not present

## 2020-01-21 DIAGNOSIS — Z1159 Encounter for screening for other viral diseases: Secondary | ICD-10-CM | POA: Diagnosis not present

## 2020-01-21 DIAGNOSIS — Z8616 Personal history of COVID-19: Secondary | ICD-10-CM

## 2020-01-21 DIAGNOSIS — R059 Cough, unspecified: Secondary | ICD-10-CM

## 2020-01-21 DIAGNOSIS — R05 Cough: Secondary | ICD-10-CM

## 2020-01-21 MED ORDER — BENZONATATE 100 MG PO CAPS: 100.0000 mg | ORAL_CAPSULE | Freq: Two times a day (BID) | ORAL | 1 refills | Status: DC | PRN

## 2020-01-21 NOTE — Progress Notes (Signed)
Subjective:    Patient ID: Michelle Fuentes, female    DOB: 1946/02/24, 74 y.o.   MRN: [id number]  Michelle Fuentes is a 74 y.o. female presenting on 01/21/2020 for Hypothyroidism   HPI  [name] presents to clinic for follow up on her hypothyroidism and to discuss her diagnosis of COVID in August 2021.    Reports had her first symptom with her COVID diagnosis on 12/11/2019 with a low grade fever of 57F, that lasted approximately 1 day.  Reports had a non-productive cough, with congestion.  Had been exposed to a friend a few days prior that had then tested positive for COVID.  Reports her spouse had started to have symptoms on 12/15/2019 and they took home tests for COVID on 12/18/2019 and were tested positive.  Repeat home test for COVID on 01/09/2020 was negative.  Has concerns for continued anterior chest wall discomfort at times.  Has some residual cough and congestion that eases up after taking her allergy nasal spray.   Depression screen [facility] 2/9 07/09/2019 05/26/2019 03/11/2019  Decreased Interest 0 0 0  Down, Depressed, Hopeless 0 1 0  PHQ - 2 Score 0 1 0    Social History   Tobacco Use  . Smoking status: Never Smoker  . Smokeless tobacco: Never Used  Vaping Use  . Vaping Use: Never used  Substance Use Topics  . Alcohol use: Yes    Comment: rare ( wine- maybe once a year )  . Drug use: No    Review of Systems  Constitutional: Negative.   HENT: Negative.   Eyes: Negative.   Respiratory: Negative.   Cardiovascular: Negative.   Gastrointestinal: Negative.   Endocrine: Negative.   Genitourinary: Negative.   Musculoskeletal: Negative.   Skin: Negative.   Allergic/Immunologic: Negative.   Neurological: Negative.   Hematological: Negative.   Psychiatric/Behavioral: Negative.    Per HPI unless specifically indicated above     Objective:    BP (!) 123/57 (BP Location: Right Arm, Patient Position: Sitting, Cuff Size: Normal)   Pulse 77   Temp 98.1 F (36.7 C)  (Oral)   Resp 18   Ht 5' 5.5" (1.664 m)   Wt 157 lb 3.2 oz (71.3 kg)   SpO2 100%   BMI 25.76 kg/m   Wt Readings from Last 3 Encounters:  01/21/20 157 lb 3.2 oz (71.3 kg)  10/28/19 157 lb 9.6 oz (71.5 kg)  05/26/19 154 lb 12.8 oz (70.2 kg)    Physical Exam Vitals reviewed.  Constitutional:      General: She is not in acute distress.    Appearance: Normal appearance. She is well-developed, well-groomed and overweight. She is not ill-appearing or toxic-appearing.  HENT:     Head: Normocephalic and atraumatic.     Nose:     Comments: Michelle Fuentes is in place, covering mouth and nose. Eyes:     General: Lids are normal. Vision grossly intact.        Right eye: No discharge.        Left eye: No discharge.     Extraocular Movements: Extraocular movements intact.     Conjunctiva/sclera: Conjunctivae normal.     Pupils: Pupils are equal, round, and reactive to light.  Cardiovascular:     Rate and Rhythm: Normal rate and regular rhythm.     Pulses: Normal pulses.     Heart sounds: Normal heart sounds. No murmur heard.  No friction rub. No gallop.   Pulmonary:  Effort: Pulmonary effort is normal. No respiratory distress.     Breath sounds: Normal breath sounds.  Skin:    General: Skin is warm and dry.     Capillary Refill: Capillary refill takes less than 2 seconds.  Neurological:     General: No focal deficit present.     Mental Status: She is alert and oriented to person, place, and time.  Psychiatric:        Attention and Perception: Attention and perception normal.        Mood and Affect: Mood and affect normal.        Speech: Speech normal.        Behavior: Behavior normal. Behavior is cooperative.        Thought Content: Thought content normal.        Cognition and Memory: Cognition and memory normal.        Judgment: Judgment normal.    Results for orders placed or performed in visit on 10/28/19  Urine Culture   Specimen: Urine  Result Value Ref Range   MICRO  NUMBER: [id number]    SPECIMEN QUALITY: Adequate    Sample Source URINE, CLEAN CATCH    STATUS: FINAL    Result: No Growth   POCT Urinalysis Dipstick  Result Value Ref Range   Color, UA yellow    Clarity, UA clear    Glucose, UA Negative Negative   Bilirubin, UA negative    Ketones, UA negative    Spec Grav, UA 1.020 1.010 - 1.025   Blood, UA negative    pH, UA 5.0 5.0 - 8.0   Protein, UA Positive (A) Negative   Urobilinogen, UA 0.2 0.2 or 1.0 E.U./dL   Nitrite, UA negative    Leukocytes, UA Small (1+) (A) Negative   Appearance     Odor        Assessment & Plan:   Problem List Items Addressed This Visit      Endocrine   Adult hypothyroidism - Primary    Repeat TSH ordered to be re-evaluated since reducing levothyroxine from 2mcg daily to 24mcg daily on 10/28/2019.    Plan: 1. Have labs drawn today      Relevant Orders   TSH + free T4     Other   Immunity status testing    Dx with COVID 12/18/2019 with first symptom onset 12/15/2019, interested in COVID antibody testing.  Will place order for lab work immunity testing.  Plan: 1. Have labs drawn today      Relevant Orders   SARS-CoV-2 Semi-Quantitative Total Antibody, Spike   Cough    Slight morning residual cough that typically clears with nasal allergy spray in the morning.  Requesting refill on tessalon perles should she need them.  Plan: 1. Refill of tessalon perles sent to pharmacy on file      Relevant Medications   benzonatate (TESSALON) 100 MG capsule   History of COVID-19    COVID-19 dx on 12/18/2019 through home rapid COVID testing.         Meds ordered this encounter  Medications  . benzonatate (TESSALON) 100 MG capsule    Sig: Take 1 capsule (100 mg total) by mouth 2 (two) times daily as needed for cough.    Dispense:  20 capsule    Refill:  1    Follow up plan: Return in about 6 months (around 07/20/2020) for Thyroid follow up visit.   Harlin Rain, [place] [facility] Nurse  Practitioner [facility]  [facility] 01/21/2020, 11:08 AM

## 2020-01-21 NOTE — Assessment & Plan Note (Signed)
Dx with COVID 12/18/2019 with first symptom onset 12/15/2019, interested in COVID antibody testing.  Will place order for lab work immunity testing.  Plan: 1. Have labs drawn today

## 2020-01-21 NOTE — Assessment & Plan Note (Signed)
COVID-19 dx on 12/18/2019 through home rapid COVID testing.

## 2020-01-21 NOTE — Assessment & Plan Note (Signed)
Slight morning residual cough that typically clears with nasal allergy spray in the morning.  Requesting refill on tessalon perles should she need them.  Plan: 1. Refill of tessalon perles sent to pharmacy on file

## 2020-01-21 NOTE — Patient Instructions (Signed)
Have your labs drawn and we will contact you with the results.  Once we receive the results of your thyroid labs, we will be in touch to let you know if we need to change the dosage of your levothyroxine.  We will plan to see you back in 6 months for thyroid follow up visit  You will receive a survey after today's visit either digitally by e-mail or paper by [place] mail. Your experiences and feedback matter to [name].  Please respond so we know how we are doing as we provide care for you.  Call us with any questions/concerns/needs.  It is my goal to be available to you for your health concerns.  Thanks for choosing me to be a partner in your healthcare needs!  Harlin Rain, FNP-C Family Nurse Practitioner [facility] Phone: [phone]

## 2020-01-21 NOTE — Assessment & Plan Note (Signed)
Repeat TSH ordered to be re-evaluated since reducing levothyroxine from 24mcg daily to 54mcg daily on 10/28/2019.    Plan: 1. Have labs drawn today

## 2020-01-22 NOTE — Progress Notes (Signed)
Patient notified of lab results

## 2020-01-25 DIAGNOSIS — H35372 Puckering of macula, left eye: Secondary | ICD-10-CM | POA: Diagnosis not present

## 2020-01-26 LAB — SARS-COV-2 SEMI-QUANTITATIVE TOTAL ANTIBODY, SPIKE: SARS COV2 AB, Total Spike Semi QN: 284.4 U/mL — ABNORMAL HIGH (ref <0.8)

## 2020-01-26 LAB — TSH+FREE T4: TSH W/REFLEX TO FT4: 0.74 mIU/L (ref 0.40–4.50)

## 2020-01-28 ENCOUNTER — Telehealth: Payer: Self-pay

## 2020-01-28 NOTE — Telephone Encounter (Signed)
Copied from [place] [id number]. Topic: General - Other >> Jan 28, 2020  2:24 PM Rainey Pines A wrote: Patient requesting a callback from [facility] in regards to confirming that she was tested for the covid antibody and if so, if she can come in office to pick up the printed results. Please advise   The pt was notified of her results.

## 2020-02-19 DIAGNOSIS — H6123 Impacted cerumen, bilateral: Secondary | ICD-10-CM | POA: Diagnosis not present

## 2020-02-19 DIAGNOSIS — H8101 Meniere's disease, right ear: Secondary | ICD-10-CM | POA: Diagnosis not present

## 2020-03-09 DIAGNOSIS — E0590 Thyrotoxicosis, unspecified without thyrotoxic crisis or storm: Secondary | ICD-10-CM | POA: Diagnosis not present

## 2020-03-09 DIAGNOSIS — H2512 Age-related nuclear cataract, left eye: Secondary | ICD-10-CM | POA: Diagnosis not present

## 2020-03-14 ENCOUNTER — Encounter: Payer: Self-pay | Admitting: Ophthalmology

## 2020-03-21 ENCOUNTER — Other Ambulatory Visit: Payer: Self-pay

## 2020-03-21 ENCOUNTER — Other Ambulatory Visit
Admission: RE | Admit: 2020-03-21 | Discharge: 2020-03-21 | Disposition: A | Discharge status: Home / Self Care | Payer: Medicare HMO | Source: Ambulatory Visit | Attending: Ophthalmology | Admitting: Ophthalmology

## 2020-03-21 DIAGNOSIS — Z01812 Encounter for preprocedural laboratory examination: Secondary | ICD-10-CM | POA: Diagnosis not present

## 2020-03-21 DIAGNOSIS — Z20822 Contact with and (suspected) exposure to covid-19: Secondary | ICD-10-CM | POA: Insufficient documentation

## 2020-03-21 NOTE — Discharge Instructions (Signed)

## 2020-03-22 LAB — SARS CORONAVIRUS 2 (TAT 6-24 HRS): SARS Coronavirus 2: NEGATIVE

## 2020-03-23 ENCOUNTER — Ambulatory Visit
Admission: RE | Admit: 2020-03-23 | Discharge: 2020-03-23 | Disposition: A | Discharge status: Home / Self Care | Payer: Medicare HMO | Attending: Ophthalmology | Admitting: Ophthalmology

## 2020-03-23 ENCOUNTER — Ambulatory Visit: Payer: Medicare HMO | Admitting: Anesthesiology

## 2020-03-23 ENCOUNTER — Encounter
Admission: RE | Disposition: A | Discharge status: Home / Self Care | Payer: Self-pay | Source: Home / Self Care | Attending: Ophthalmology

## 2020-03-23 ENCOUNTER — Other Ambulatory Visit: Payer: Self-pay

## 2020-03-23 DIAGNOSIS — H2512 Age-related nuclear cataract, left eye: Secondary | ICD-10-CM | POA: Diagnosis not present

## 2020-03-23 DIAGNOSIS — Z79899 Other long term (current) drug therapy: Secondary | ICD-10-CM | POA: Diagnosis not present

## 2020-03-23 DIAGNOSIS — Z79890 Hormone replacement therapy (postmenopausal): Secondary | ICD-10-CM | POA: Insufficient documentation

## 2020-03-23 DIAGNOSIS — Z8616 Personal history of COVID-19: Secondary | ICD-10-CM | POA: Insufficient documentation

## 2020-03-23 DIAGNOSIS — H25812 Combined forms of age-related cataract, left eye: Secondary | ICD-10-CM | POA: Diagnosis not present

## 2020-03-23 HISTORY — PX: CATARACT EXTRACTION W/PHACO: SHX586

## 2020-03-23 SURGERY — PHACOEMULSIFICATION, CATARACT, WITH IOL INSERTION
Anesthesia: Monitor Anesthesia Care | Site: Eye | Laterality: Left

## 2020-03-23 MED ORDER — NA HYALUR & NA CHOND-NA HYALUR 0.4-0.35 ML IO KIT
PACK | INTRAOCULAR | Status: DC | PRN
  Administered 2020-03-23: 1 mL via INTRAOCULAR

## 2020-03-23 MED ORDER — MOXIFLOXACIN HCL 0.5 % OP SOLN: 1.0000 [drp] | OPHTHALMIC | Status: DC | PRN

## 2020-03-23 MED ORDER — BRIMONIDINE TARTRATE-TIMOLOL 0.2-0.5 % OP SOLN
OPHTHALMIC | Status: DC | PRN
  Administered 2020-03-23: 1 [drp] via OPHTHALMIC

## 2020-03-23 MED ORDER — TETRACAINE HCL 0.5 % OP SOLN
1.0000 [drp] | OPHTHALMIC | Status: DC | PRN
  Administered 2020-03-23 (×3): 1 [drp] via OPHTHALMIC

## 2020-03-23 MED ORDER — ACETAMINOPHEN 325 MG PO TABS: 325.0000 mg | ORAL_TABLET | Freq: Once | ORAL | Status: DC

## 2020-03-23 MED ORDER — LACTATED RINGERS IV SOLN: INTRAVENOUS | Status: DC

## 2020-03-23 MED ORDER — ARMC OPHTHALMIC DILATING DROPS
1.0000 "application " | OPHTHALMIC | Status: DC | PRN
  Administered 2020-03-23 (×3): 1 via OPHTHALMIC

## 2020-03-23 MED ORDER — CEFUROXIME OPHTHALMIC INJECTION 1 MG/0.1 ML
INJECTION | OPHTHALMIC | Status: DC | PRN
  Administered 2020-03-23: 0.1 mL via INTRACAMERAL

## 2020-03-23 MED ORDER — FENTANYL CITRATE (PF) 100 MCG/2ML IJ SOLN
INTRAMUSCULAR | Status: DC | PRN
  Administered 2020-03-23: 50 ug via INTRAVENOUS

## 2020-03-23 MED ORDER — LIDOCAINE HCL (PF) 2 % IJ SOLN
INTRAOCULAR | Status: DC | PRN
  Administered 2020-03-23: 1 mL

## 2020-03-23 MED ORDER — ACETAMINOPHEN 160 MG/5ML PO SOLN: 325.0000 mg | Freq: Once | ORAL | Status: DC

## 2020-03-23 MED ORDER — EPINEPHRINE PF 1 MG/ML IJ SOLN
INTRAOCULAR | Status: DC | PRN
  Administered 2020-03-23: 85 mL via OPHTHALMIC

## 2020-03-23 MED ORDER — MIDAZOLAM HCL 2 MG/2ML IJ SOLN
INTRAMUSCULAR | Status: DC | PRN
  Administered 2020-03-23: 2 mg via INTRAVENOUS

## 2020-03-23 SURGICAL SUPPLY — 29 items
CANNULA ANT/CHMB 27G (MISCELLANEOUS) ×1 IMPLANT
CANNULA ANT/CHMB 27GA (MISCELLANEOUS) ×2 IMPLANT
GLOVE SURG LX 7.5 STRW (GLOVE) ×2
GLOVE SURG LX STRL 7.5 STRW (GLOVE) ×1 IMPLANT
GLOVE SURG TRIUMPH 8.0 PF LTX (GLOVE) ×2 IMPLANT
GOWN STRL REUS W/ TWL LRG LVL3 (GOWN DISPOSABLE) ×2 IMPLANT
GOWN STRL REUS W/TWL LRG LVL3 (GOWN DISPOSABLE) ×4
LENS IOL DIOP 15.5 (Intraocular Lens) ×2 IMPLANT
LENS IOL TECNIS MONO 15.5 (Intraocular Lens) IMPLANT
MARKER SKIN DUAL TIP RULER LAB (MISCELLANEOUS) ×2 IMPLANT
NDL CAPSULORHEX 25GA (NEEDLE) ×1 IMPLANT
NDL FILTER BLUNT 18X1 1/2 (NEEDLE) ×2 IMPLANT
NDL RETROBULBAR .5 NSTRL (NEEDLE) IMPLANT
NEEDLE CAPSULORHEX 25GA (NEEDLE) ×2 IMPLANT
NEEDLE FILTER BLUNT 18X 1/2SAF (NEEDLE) ×2
NEEDLE FILTER BLUNT 18X1 1/2 (NEEDLE) ×2 IMPLANT
PACK CATARACT BRASINGTON (MISCELLANEOUS) ×2 IMPLANT
PACK EYE AFTER SURG (MISCELLANEOUS) ×2 IMPLANT
PACK OPTHALMIC (MISCELLANEOUS) ×2 IMPLANT
RING MALYGIN 7.0 (MISCELLANEOUS) IMPLANT
SOLUTION OPHTHALMIC SALT (MISCELLANEOUS) ×2 IMPLANT
SUT ETHILON 10-0 CS-B-6CS-B-6 (SUTURE)
SUT VICRYL  9 0 (SUTURE)
SUT VICRYL 9 0 (SUTURE) IMPLANT
SUTURE EHLN 10-0 CS-B-6CS-B-6 (SUTURE) IMPLANT
SYR 3ML LL SCALE MARK (SYRINGE) ×4 IMPLANT
SYR TB 1ML LUER SLIP (SYRINGE) ×2 IMPLANT
WATER STERILE IRR 250ML POUR (IV SOLUTION) ×2 IMPLANT
WIPE NON LINTING 3.25X3.25 (MISCELLANEOUS) ×2 IMPLANT

## 2020-03-23 NOTE — Anesthesia Procedure Notes (Signed)
Procedure Name: MAC Date/Time: 03/23/2020 10:57 AM Performed by: Silvana Newness, CRNA Pre-anesthesia Checklist: Patient identified, Emergency Drugs available, Suction available, Patient being monitored and Timeout performed Patient Re-evaluated:Patient Re-evaluated prior to induction Oxygen Delivery Method: Nasal cannula Placement Confirmation: positive ETCO2

## 2020-03-23 NOTE — Transfer of Care (Signed)
Immediate Anesthesia Transfer of Care Note  Patient: Michelle Fuentes  Procedure(s) Performed: CATARACT EXTRACTION PHACO AND INTRAOCULAR LENS PLACEMENT (IOC) LEFT (Left Eye)  Patient Location: PACU  Anesthesia Type: MAC  Level of Consciousness: awake, alert  and patient cooperative  Airway and Oxygen Therapy: Patient Spontanous Breathing and Patient connected to supplemental oxygen  Post-op Assessment: Post-op Vital signs reviewed, Patient's Cardiovascular Status Stable, Respiratory Function Stable, Patent Airway and No signs of Nausea or vomiting  Post-op Vital Signs: Reviewed and stable  Complications: No complications documented.

## 2020-03-23 NOTE — Anesthesia Preprocedure Evaluation (Signed)
Anesthesia Evaluation  Patient identified by MRN, date of birth, ID band Patient awake    Reviewed: Allergy & Precautions, H&P , NPO status , Patient's Chart, lab work & pertinent test results  Airway Mallampati: II  TM Distance: >3 FB Neck ROM: full    Dental no notable dental hx.    Pulmonary    Pulmonary exam normal breath sounds clear to auscultation       Cardiovascular Normal cardiovascular exam Rhythm:regular Rate:Normal     Neuro/Psych TIA   GI/Hepatic GERD  ,  Endo/Other  Hypothyroidism   Renal/GU      Musculoskeletal   Abdominal   Peds  Hematology   Anesthesia Other Findings   Reproductive/Obstetrics                             Anesthesia Physical Anesthesia Plan  ASA: II  Anesthesia Plan: MAC   Post-op Pain Management:    Induction:   PONV Risk Score and Plan: 2 and Treatment may vary due to age or medical condition, TIVA and Midazolam  Airway Management Planned:   Additional Equipment:   Intra-op Plan:   Post-operative Plan:   Informed Consent: I have reviewed the patients History and Physical, chart, labs and discussed the procedure including the risks, benefits and alternatives for the proposed anesthesia with the patient or authorized representative who has indicated his/her understanding and acceptance.     Dental Advisory Given  Plan Discussed with: CRNA  Anesthesia Plan Comments:         Anesthesia Quick Evaluation

## 2020-03-23 NOTE — H&P (Signed)

## 2020-03-23 NOTE — Op Note (Signed)
OPERATIVE NOTE  Michelle Fuentes [id number] 03/23/2020   PREOPERATIVE DIAGNOSIS:  Nuclear sclerotic cataract left eye. H25.12   POSTOPERATIVE DIAGNOSIS:    Nuclear sclerotic cataract left eye.     PROCEDURE:  Phacoemusification with posterior chamber intraocular lens placement of the left eye  Ultrasound time: Procedure(s) with comments: CATARACT EXTRACTION PHACO AND INTRAOCULAR LENS PLACEMENT (IOC) LEFT (Left) - 6.35 1:10.9 9.0%  LENS:   Implant Name Type Inv. Item Serial No. Manufacturer Lot No. LRB No. Used Action  LENS IOL DIOP 15.5 - [id number] Intraocular Lens LENS IOL DIOP 15.5 [id number] [id number]   Left 1 Implanted      SURGEON:  Wyonia Hough, MD   ANESTHESIA:  Topical with tetracaine drops and 2% Xylocaine jelly, augmented with 1% preservative-free intracameral lidocaine.    COMPLICATIONS:  None.   DESCRIPTION OF PROCEDURE:  The patient was identified in the holding room and transported to the operating room and placed in the supine position under the operating microscope.  The left eye was identified as the operative eye and it was prepped and draped in the usual sterile ophthalmic fashion.   A 1 millimeter clear-corneal paracentesis was made at the 1:30 position.  0.5 ml of preservative-free 1% lidocaine was injected into the anterior chamber.  The anterior chamber was filled with Viscoat viscoelastic.  A 2.4 millimeter keratome was used to make a near-clear corneal incision at the 10:30 position.  .  A curvilinear capsulorrhexis was made with a cystotome and capsulorrhexis forceps.  Balanced salt solution was used to hydrodissect and hydrodelineate the nucleus.   Phacoemulsification was then used in stop and chop fashion to remove the lens nucleus and epinucleus.  The remaining cortex was then removed using the irrigation and aspiration handpiece. Provisc was then placed into the capsular bag to distend it for lens placement.  A lens was then injected into the  capsular bag.  The remaining viscoelastic was aspirated.   Wounds were hydrated with balanced salt solution.  The anterior chamber was inflated to a physiologic pressure with balanced salt solution.  No wound leaks were noted. Cefuroxime 0.1 ml of a 10mg /ml solution was injected into the anterior chamber for a dose of 1 mg of intracameral antibiotic at the completion of the case.   Timolol and Brimonidine drops were applied to the eye.  The patient was taken to the recovery room in stable condition without complications of anesthesia or surgery.  Michelle Fuentes 03/23/2020, 11:14 AM

## 2020-03-23 NOTE — Anesthesia Postprocedure Evaluation (Signed)
Anesthesia Post Note  Patient: Michelle Fuentes  Procedure(s) Performed: CATARACT EXTRACTION PHACO AND INTRAOCULAR LENS PLACEMENT (IOC) LEFT (Left Eye)     Patient location during evaluation: PACU Anesthesia Type: MAC Level of consciousness: awake and alert and oriented Pain management: satisfactory to patient Vital Signs Assessment: post-procedure vital signs reviewed and stable Respiratory status: spontaneous breathing, nonlabored ventilation and respiratory function stable Cardiovascular status: blood pressure returned to baseline and stable Postop Assessment: Adequate PO intake and No signs of nausea or vomiting Anesthetic complications: no   No complications documented.  Raliegh Ip

## 2020-03-24 ENCOUNTER — Encounter: Payer: Self-pay | Admitting: Ophthalmology

## 2020-04-04 ENCOUNTER — Encounter: Payer: Self-pay | Admitting: Ophthalmology

## 2020-04-07 DIAGNOSIS — H2511 Age-related nuclear cataract, right eye: Secondary | ICD-10-CM | POA: Diagnosis not present

## 2020-04-07 DIAGNOSIS — E039 Hypothyroidism, unspecified: Secondary | ICD-10-CM | POA: Diagnosis not present

## 2020-04-11 ENCOUNTER — Other Ambulatory Visit
Admission: RE | Admit: 2020-04-11 | Discharge: 2020-04-11 | Disposition: A | Discharge status: Home / Self Care | Payer: Medicare HMO | Source: Ambulatory Visit | Attending: Ophthalmology | Admitting: Ophthalmology

## 2020-04-11 ENCOUNTER — Other Ambulatory Visit: Payer: Self-pay

## 2020-04-11 DIAGNOSIS — Z20822 Contact with and (suspected) exposure to covid-19: Secondary | ICD-10-CM | POA: Insufficient documentation

## 2020-04-11 DIAGNOSIS — Z01812 Encounter for preprocedural laboratory examination: Secondary | ICD-10-CM | POA: Diagnosis present

## 2020-04-11 NOTE — Discharge Instructions (Signed)

## 2020-04-12 LAB — SARS CORONAVIRUS 2 (TAT 6-24 HRS): SARS Coronavirus 2: NEGATIVE

## 2020-04-13 ENCOUNTER — Ambulatory Visit: Payer: Medicare HMO | Admitting: Anesthesiology

## 2020-04-13 ENCOUNTER — Ambulatory Visit
Admission: RE | Admit: 2020-04-13 | Discharge: 2020-04-13 | Disposition: A | Discharge status: Home / Self Care | Payer: Medicare HMO | Attending: Ophthalmology | Admitting: Ophthalmology

## 2020-04-13 ENCOUNTER — Encounter
Admission: RE | Disposition: A | Discharge status: Home / Self Care | Payer: Self-pay | Source: Home / Self Care | Attending: Ophthalmology

## 2020-04-13 ENCOUNTER — Encounter: Payer: Self-pay | Admitting: Ophthalmology

## 2020-04-13 ENCOUNTER — Other Ambulatory Visit: Payer: Self-pay

## 2020-04-13 DIAGNOSIS — H8109 Meniere's disease, unspecified ear: Secondary | ICD-10-CM | POA: Diagnosis not present

## 2020-04-13 DIAGNOSIS — Z9842 Cataract extraction status, left eye: Secondary | ICD-10-CM | POA: Insufficient documentation

## 2020-04-13 DIAGNOSIS — H2511 Age-related nuclear cataract, right eye: Secondary | ICD-10-CM | POA: Diagnosis not present

## 2020-04-13 DIAGNOSIS — Z8673 Personal history of transient ischemic attack (TIA), and cerebral infarction without residual deficits: Secondary | ICD-10-CM | POA: Diagnosis not present

## 2020-04-13 DIAGNOSIS — H25811 Combined forms of age-related cataract, right eye: Secondary | ICD-10-CM | POA: Diagnosis not present

## 2020-04-13 DIAGNOSIS — Z8616 Personal history of COVID-19: Secondary | ICD-10-CM | POA: Diagnosis not present

## 2020-04-13 HISTORY — PX: CATARACT EXTRACTION W/PHACO: SHX586

## 2020-04-13 SURGERY — PHACOEMULSIFICATION, CATARACT, WITH IOL INSERTION
Anesthesia: Monitor Anesthesia Care | Laterality: Right

## 2020-04-13 MED ORDER — NA HYALUR & NA CHOND-NA HYALUR 0.4-0.35 ML IO KIT
PACK | INTRAOCULAR | Status: DC | PRN
  Administered 2020-04-13: 1 mL via INTRAOCULAR

## 2020-04-13 MED ORDER — ACETAMINOPHEN 325 MG PO TABS: 325.0000 mg | ORAL_TABLET | ORAL | Status: DC | PRN

## 2020-04-13 MED ORDER — ONDANSETRON HCL 4 MG/2ML IJ SOLN: 4.0000 mg | Freq: Once | INTRAMUSCULAR | Status: DC | PRN

## 2020-04-13 MED ORDER — LIDOCAINE HCL (PF) 2 % IJ SOLN
INTRAOCULAR | Status: DC | PRN
  Administered 2020-04-13: 2 mL

## 2020-04-13 MED ORDER — ARMC OPHTHALMIC DILATING DROPS
1.0000 "application " | OPHTHALMIC | Status: DC | PRN
  Administered 2020-04-13 (×3): 1 via OPHTHALMIC

## 2020-04-13 MED ORDER — ACETAMINOPHEN 160 MG/5ML PO SOLN: 325.0000 mg | ORAL | Status: DC | PRN

## 2020-04-13 MED ORDER — FENTANYL CITRATE (PF) 100 MCG/2ML IJ SOLN
INTRAMUSCULAR | Status: DC | PRN
  Administered 2020-04-13 (×2): 50 ug via INTRAVENOUS

## 2020-04-13 MED ORDER — CEFUROXIME OPHTHALMIC INJECTION 1 MG/0.1 ML
INJECTION | OPHTHALMIC | Status: DC | PRN
  Administered 2020-04-13: 0.1 mL via INTRACAMERAL

## 2020-04-13 MED ORDER — TETRACAINE HCL 0.5 % OP SOLN
1.0000 [drp] | OPHTHALMIC | Status: DC | PRN
  Administered 2020-04-13 (×3): 1 [drp] via OPHTHALMIC

## 2020-04-13 MED ORDER — BRIMONIDINE TARTRATE-TIMOLOL 0.2-0.5 % OP SOLN
OPHTHALMIC | Status: DC | PRN
  Administered 2020-04-13: 1 [drp] via OPHTHALMIC

## 2020-04-13 MED ORDER — MIDAZOLAM HCL 2 MG/2ML IJ SOLN
INTRAMUSCULAR | Status: DC | PRN
  Administered 2020-04-13: 2 mg via INTRAVENOUS

## 2020-04-13 MED ORDER — EPINEPHRINE PF 1 MG/ML IJ SOLN
INTRAOCULAR | Status: DC | PRN
  Administered 2020-04-13: 88 mL via OPHTHALMIC

## 2020-04-13 SURGICAL SUPPLY — 23 items
CANNULA ANT/CHMB 27G (MISCELLANEOUS) ×1 IMPLANT
CANNULA ANT/CHMB 27GA (MISCELLANEOUS) ×2 IMPLANT
GLOVE SURG LX 7.5 STRW (GLOVE) ×1
GLOVE SURG LX STRL 7.5 STRW (GLOVE) ×1 IMPLANT
GLOVE SURG TRIUMPH 8.0 PF LTX (GLOVE) ×2 IMPLANT
GOWN STRL REUS W/ TWL LRG LVL3 (GOWN DISPOSABLE) ×2 IMPLANT
GOWN STRL REUS W/TWL LRG LVL3 (GOWN DISPOSABLE) ×4
LENS IOL DIOP 15.5 (Intraocular Lens) ×2 IMPLANT
LENS IOL TECNIS MONO 15.5 (Intraocular Lens) IMPLANT
MARKER SKIN DUAL TIP RULER LAB (MISCELLANEOUS) ×2 IMPLANT
NDL CAPSULORHEX 25GA (NEEDLE) ×1 IMPLANT
NDL FILTER BLUNT 18X1 1/2 (NEEDLE) ×2 IMPLANT
NEEDLE CAPSULORHEX 25GA (NEEDLE) ×2 IMPLANT
NEEDLE FILTER BLUNT 18X 1/2SAF (NEEDLE) ×2
NEEDLE FILTER BLUNT 18X1 1/2 (NEEDLE) ×2 IMPLANT
PACK CATARACT BRASINGTON (MISCELLANEOUS) ×2 IMPLANT
PACK EYE AFTER SURG (MISCELLANEOUS) ×2 IMPLANT
PACK OPTHALMIC (MISCELLANEOUS) ×2 IMPLANT
SOLUTION OPHTHALMIC SALT (MISCELLANEOUS) ×2 IMPLANT
SYR 3ML LL SCALE MARK (SYRINGE) ×4 IMPLANT
SYR TB 1ML LUER SLIP (SYRINGE) ×2 IMPLANT
WATER STERILE IRR 250ML POUR (IV SOLUTION) ×2 IMPLANT
WIPE NON LINTING 3.25X3.25 (MISCELLANEOUS) ×2 IMPLANT

## 2020-04-13 NOTE — Anesthesia Preprocedure Evaluation (Signed)
Anesthesia Evaluation  Patient identified by MRN, date of birth, ID band Patient awake    Reviewed: Allergy & Precautions, H&P , NPO status , Patient's Chart, lab work & pertinent test results  History of Anesthesia Complications Negative for: history of anesthetic complications  Airway Mallampati: II  TM Distance: >3 FB Neck ROM: full    Dental no notable dental hx.    Pulmonary    Pulmonary exam normal breath sounds clear to auscultation       Cardiovascular Exercise Tolerance: Good Normal cardiovascular exam Rhythm:regular Rate:Normal     Neuro/Psych TIA Neuromuscular disease    GI/Hepatic GERD  ,  Endo/Other  Hypothyroidism   Renal/GU      Musculoskeletal   Abdominal   Peds  Hematology  (+) Blood dyscrasia, anemia ,   Anesthesia Other Findings   Reproductive/Obstetrics                             Anesthesia Physical  Anesthesia Plan  ASA: III  Anesthesia Plan: MAC   Post-op Pain Management:    Induction:   PONV Risk Score and Plan: 2 and Treatment may vary due to age or medical condition, TIVA and Midazolam  Airway Management Planned: Nasal Cannula and Natural Airway  Additional Equipment:   Intra-op Plan:   Post-operative Plan:   Informed Consent: I have reviewed the patients History and Physical, chart, labs and discussed the procedure including the risks, benefits and alternatives for the proposed anesthesia with the patient or authorized representative who has indicated his/her understanding and acceptance.     Dental Advisory Given  Plan Discussed with: CRNA  Anesthesia Plan Comments:         Anesthesia Quick Evaluation

## 2020-04-13 NOTE — Anesthesia Postprocedure Evaluation (Addendum)
Anesthesia Post Note  Patient: Michelle Fuentes  Procedure(s) Performed: CATARACT EXTRACTION PHACO AND INTRAOCULAR LENS PLACEMENT (IOC) RIGHT 9.94 01:39.7 10.0% (Right )     Patient location during evaluation: PACU Anesthesia Type: MAC Level of consciousness: awake and alert Pain management: pain level controlled Vital Signs Assessment: post-procedure vital signs reviewed and stable Respiratory status: spontaneous breathing, nonlabored ventilation, respiratory function stable and patient connected to nasal cannula oxygen Cardiovascular status: stable and blood pressure returned to baseline Postop Assessment: no apparent nausea or vomiting Anesthetic complications: no   No complications documented.  Sinda Du

## 2020-04-13 NOTE — Transfer of Care (Signed)
Immediate Anesthesia Transfer of Care Note  Patient: Michelle Fuentes  Procedure(s) Performed: CATARACT EXTRACTION PHACO AND INTRAOCULAR LENS PLACEMENT (IOC) RIGHT 9.94 01:39.7 10.0% (Right )  Patient Location: PACU  Anesthesia Type: MAC  Level of Consciousness: awake, alert  and patient cooperative  Airway and Oxygen Therapy: Patient Spontanous Breathing and Patient connected to supplemental oxygen  Post-op Assessment: Post-op Vital signs reviewed, Patient's Cardiovascular Status Stable, Respiratory Function Stable, Patent Airway and No signs of Nausea or vomiting  Post-op Vital Signs: Reviewed and stable  Complications: No complications documented.

## 2020-04-13 NOTE — H&P (Signed)

## 2020-04-13 NOTE — Op Note (Signed)
LOCATION:  [place]   PREOPERATIVE DIAGNOSIS:    Nuclear sclerotic cataract right eye. H25.11   POSTOPERATIVE DIAGNOSIS:  Nuclear sclerotic cataract right eye.     PROCEDURE:  Phacoemusification with posterior chamber intraocular lens placement of the right eye   ULTRASOUND TIME: Procedure(s): CATARACT EXTRACTION PHACO AND INTRAOCULAR LENS PLACEMENT (IOC) RIGHT 9.94 01:39.7 10.0% (Right)  LENS:   Implant Name Type Inv. Item Serial No. Manufacturer Lot No. LRB No. Used Action  LENS IOL DIOP 15.5 - [id number] Intraocular Lens LENS IOL DIOP 15.5 [id number] [id number]   Right 1 Implanted         SURGEON:  Wyonia Hough, MD   ANESTHESIA:  Topical with tetracaine drops and 2% Xylocaine jelly, augmented with 1% preservative-free intracameral lidocaine.    COMPLICATIONS:  None.   DESCRIPTION OF PROCEDURE:  The patient was identified in the holding room and transported to the operating room and placed in the supine position under the operating microscope.  The right eye was identified as the operative eye and it was prepped and draped in the usual sterile ophthalmic fashion.   A 1 millimeter clear-corneal paracentesis was made at the 12:00 position.  0.5 ml of preservative-free 1% lidocaine was injected into the anterior chamber. The anterior chamber was filled with Viscoat viscoelastic.  A 2.4 millimeter keratome was used to make a near-clear corneal incision at the 9:00 position.  A curvilinear capsulorrhexis was made with a cystotome and capsulorrhexis forceps.  Balanced salt solution was used to hydrodissect and hydrodelineate the nucleus.   Phacoemulsification was then used in stop and chop fashion to remove the lens nucleus and epinucleus.  The remaining cortex was then removed using the irrigation and aspiration handpiece. Provisc was then placed into the capsular bag to distend it for lens placement.  A lens was then injected into the capsular bag.  The remaining  viscoelastic was aspirated.   Wounds were hydrated with balanced salt solution.  The anterior chamber was inflated to a physiologic pressure with balanced salt solution.  No wound leaks were noted. Cefuroxime 0.1 ml of a 10mg /ml solution was injected into the anterior chamber for a dose of 1 mg of intracameral antibiotic at the completion of the case.   Timolol and Brimonidine drops and maxitrol ointment were applied to the eye.  The patient was taken to the recovery room in stable condition without complications of anesthesia or surgery.   Brewer Hitchman 04/13/2020, 9:27 AM

## 2020-04-13 NOTE — Anesthesia Procedure Notes (Signed)
Procedure Name: MAC Date/Time: 04/13/2020 9:09 AM Performed by: Jeannene Patella, CRNA Pre-anesthesia Checklist: Patient identified, Emergency Drugs available, Suction available, Timeout performed and Patient being monitored Patient Re-evaluated:Patient Re-evaluated prior to induction Oxygen Delivery Method: Nasal cannula Placement Confirmation: positive ETCO2

## 2020-04-14 ENCOUNTER — Encounter: Payer: Self-pay | Admitting: Ophthalmology

## 2020-05-20 DIAGNOSIS — Z961 Presence of intraocular lens: Secondary | ICD-10-CM | POA: Diagnosis not present

## 2020-05-30 ENCOUNTER — Other Ambulatory Visit: Payer: Self-pay

## 2020-05-30 ENCOUNTER — Ambulatory Visit (INDEPENDENT_AMBULATORY_CARE_PROVIDER_SITE_OTHER): Payer: Medicare HMO | Admitting: Family Medicine

## 2020-05-30 ENCOUNTER — Encounter: Payer: Self-pay | Admitting: Family Medicine

## 2020-05-30 VITALS — BP 137/66 | HR 83 | Resp 18 | Ht 66.0 in | Wt 160.4 lb

## 2020-05-30 DIAGNOSIS — H00011 Hordeolum externum right upper eyelid: Secondary | ICD-10-CM | POA: Diagnosis not present

## 2020-05-30 MED ORDER — BACITRACIN 500 UNIT/GM EX OINT: 1.0000 "application " | TOPICAL_OINTMENT | Freq: Two times a day (BID) | CUTANEOUS | 0 refills | Status: DC

## 2020-05-30 NOTE — Patient Instructions (Signed)
Apply a thin strip of bacitracin ointment to the right eye 2x per day.  Be sure to wash hands well before/after applying the medication.  We will plan to see you back if your symptoms worsen or fail to improve  You will receive a survey after today's visit either digitally by e-mail or paper by [employer] mail. Your experiences and feedback matter to [name].  Please respond so we know how we are doing as we provide care for you.  Call us with any questions/concerns/needs.  It is my goal to be available to you for your health concerns.  Thanks for choosing me to be a partner in your healthcare needs!  Harlin Rain, FNP-C Family Nurse Practitioner [facility] Phone: [phone]

## 2020-05-30 NOTE — Assessment & Plan Note (Signed)
Right upper inner eyelid with small hordeolum noted.  Will treat with topical bacitracin application 2x per day for the next 5-7 days.  To RTC if symptoms worsen or fail to improve.

## 2020-05-30 NOTE — Progress Notes (Signed)
Subjective:    Patient ID: Michelle Fuentes, female    DOB: [birth year], 75 y.o.   MRN: [id number]  Michelle Fuentes is a 75 y.o. female presenting on 05/30/2020 for Eye Problem (Swollen Rt lower eyelid  x3 days. Tender to the touch)   HPI  [name] presents to clinic for concerns of right eyelid discomfort x 3 days that is slightly tender to the touch.  Denies discharge, eyelash caking, pain with eye movement.  Has not tried anything topical for this yet.  Denies visual changes.  Depression screen [facility] 2/9 07/09/2019 05/26/2019 03/11/2019  Decreased Interest 0 0 0  Down, Depressed, Hopeless 0 1 0  PHQ - 2 Score 0 1 0    Social History   Tobacco Use  . Smoking status: Never Smoker  . Smokeless tobacco: Never Used  Vaping Use  . Vaping Use: Never used  Substance Use Topics  . Alcohol use: Yes    Comment: rare ( wine- maybe once a year )  . Drug use: No    Review of Systems  Constitutional: Negative.   HENT: Negative.   Eyes: Negative.        Right eyelid discomfort  Respiratory: Negative.   Cardiovascular: Negative.   Gastrointestinal: Negative.   Endocrine: Negative.   Genitourinary: Negative.   Musculoskeletal: Negative.   Skin: Negative.   Allergic/Immunologic: Negative.   Neurological: Negative.   Hematological: Negative.   Psychiatric/Behavioral: Negative.    Per HPI unless specifically indicated above     Objective:    BP 137/66 (BP Location: Right Arm, Patient Position: Sitting, Cuff Size: Normal)   Pulse 83   Resp 18   Ht 5\' 6"  (1.676 m)   Wt 160 lb 6.4 oz (72.8 kg)   SpO2 98%   BMI 25.89 kg/m   Wt Readings from Last 3 Encounters:  05/30/20 160 lb 6.4 oz (72.8 kg)  04/13/20 155 lb (70.3 kg)  03/23/20 155 lb (70.3 kg)    Physical Exam Vitals and nursing note reviewed.  Constitutional:      General: She is not in acute distress.    Appearance: Normal appearance. She is well-developed and well-groomed. She is not ill-appearing or  toxic-appearing.  HENT:     Head: Normocephalic and atraumatic.     Nose:     Comments: Michelle Fuentes is in place, covering mouth and nose. Eyes:     General: Lids are normal. Vision grossly intact.        Right eye: No discharge.        Left eye: No discharge.     Extraocular Movements: Extraocular movements intact.     Conjunctiva/sclera: Conjunctivae normal.     Pupils: Pupils are equal, round, and reactive to light.   Pulmonary:     Effort: Pulmonary effort is normal. No respiratory distress.  Skin:    General: Skin is warm and dry.     Capillary Refill: Capillary refill takes less than 2 seconds.  Neurological:     General: No focal deficit present.     Mental Status: She is alert and oriented to person, place, and time.  Psychiatric:        Attention and Perception: Attention and perception normal.        Mood and Affect: Mood and affect normal.        Speech: Speech normal.        Behavior: Behavior normal. Behavior is cooperative.        Thought  Content: Thought content normal.        Cognition and Memory: Cognition and memory normal.        Judgment: Judgment normal.    Results for orders placed or performed during the hospital encounter of 04/11/20  SARS CORONAVIRUS 2 (TAT 6-24 HRS) Nasopharyngeal Nasopharyngeal Swab   Specimen: Nasopharyngeal Swab  Result Value Ref Range   SARS Coronavirus 2 NEGATIVE NEGATIVE      Assessment & Plan:   Problem List Items Addressed This Visit      Other   Hordeolum externum of right upper eyelid - Primary    Right upper inner eyelid with small hordeolum noted.  Will treat with topical bacitracin application 2x per day for the next 5-7 days.  To RTC if symptoms worsen or fail to improve.      Relevant Medications   bacitracin 500 UNIT/GM ointment      Meds ordered this encounter  Medications  . bacitracin 500 UNIT/GM ointment    Sig: Apply 1 application topically 2 (two) times daily.    Dispense:  15 g    Refill:  0    Follow up plan: Return if symptoms worsen or fail to improve.   Harlin Rain, [facility] Nurse Practitioner [facility] 05/30/2020, 2:25 PM

## 2020-05-31 ENCOUNTER — Ambulatory Visit (INDEPENDENT_AMBULATORY_CARE_PROVIDER_SITE_OTHER): Payer: Medicare HMO

## 2020-05-31 ENCOUNTER — Other Ambulatory Visit: Payer: Self-pay | Admitting: Family Medicine

## 2020-05-31 ENCOUNTER — Telehealth: Payer: Self-pay | Admitting: Family Medicine

## 2020-05-31 VITALS — Ht 66.0 in | Wt 160.0 lb

## 2020-05-31 DIAGNOSIS — Z0000 Encounter for general adult medical examination without abnormal findings: Secondary | ICD-10-CM | POA: Diagnosis not present

## 2020-05-31 MED ORDER — BACITRACIN-POLYMYXIN B 500-10000 UNIT/GM OP OINT: 1.0000 "application " | TOPICAL_OINTMENT | Freq: Two times a day (BID) | OPHTHALMIC | 0 refills | Status: DC

## 2020-05-31 NOTE — Progress Notes (Signed)
I connected with Michelle Fuentes today by telephone and verified that I am speaking with the correct person using two identifiers. Location patient: home Location provider: work Persons participating in the virtual visit: Michelle Fuentes, [place] LPN.   I discussed the limitations, risks, security and privacy concerns of performing an evaluation and management service by telephone and the availability of in person appointments. I also discussed with the patient that there may be a patient responsible charge related to this service. The patient expressed understanding and verbally consented to this telephonic visit.    Interactive audio and video telecommunications were attempted between this provider and patient, however failed, due to patient having technical difficulties OR patient did not have access to video capability.  We continued and completed visit with audio only.     Vital signs may be patient reported or missing  Subjective:   Michelle Fuentes is a 75 y.o. female who presents for Medicare Annual (Subsequent) preventive examination.  Review of Systems     Cardiac Risk Factors include: advanced age (>41men, >98 women);sedentary lifestyle     Objective:    Today's Vitals   05/31/20 1317  Weight: 160 lb (72.6 kg)  Height: 5\' 6"  (1.676 m)   Body mass index is 25.82 kg/m.  Advanced Directives 05/31/2020 04/13/2020 03/23/2020 05/26/2019 03/26/2017  Does Patient Have a Medical Advance Directive? Yes Yes Yes Yes Yes  Type of Paramedic of [place];Living will [place];Living will [place];Living will Living will;Healthcare Power of [place];Living will  Does patient want to make changes to medical advance directive? - No - Patient declined - - -  Copy of [place] in Chart? No - copy requested No - copy requested No - copy requested No - copy requested No - copy  requested    Current Medications (verified) Outpatient Encounter Medications as of 05/31/2020  Medication Sig  . bacitracin 500 UNIT/GM ointment Apply 1 application topically 2 (two) times daily.  . benzonatate (TESSALON) 100 MG capsule Take 1 capsule (100 mg total) by mouth 2 (two) times daily as needed for cough.  . Cholecalciferol 1000 UNITS capsule Take 1,000 Units by mouth daily.   . cimetidine (TAGAMET) 200 MG tablet Take 200 mg by mouth 2 (two) times daily.  . diazepam (VALIUM) 2 MG tablet Take 2 mg by mouth every 6 (six) hours as needed.   . fluticasone (FLONASE) 50 MCG/ACT nasal spray SPRAY 2 SPRAYS INTO EACH NOSTRIL EVERY DAY  . Ginkgo Biloba Extract 60 MG CAPS Take 120 mg by mouth.   . levothyroxine (SYNTHROID) 75 MCG tablet Take 1 tablet (75 mcg total) by mouth daily.  Marland Kitchen loratadine (CLARITIN) 10 MG tablet Take 10 mg daily by mouth.  . Lutein 6 MG CAPS Take 10 mg by mouth daily.   . Magnesium Gluconate 250 MG TABS Take 250 mg by mouth daily.  (Patient not taking: Reported on 05/30/2020)  . meclizine (ANTIVERT) 25 MG tablet Take 25 mg by mouth once as needed.   Marland Kitchen omeprazole (PRILOSEC) 20 MG capsule Take 20 mg by mouth daily.   . predniSONE (DELTASONE) 10 MG tablet Take 10 mg by mouth once a week.   . triamcinolone cream (KENALOG) 0.5 % Apply 1 application topically 2 (two) times daily. To affected areas, for up to 2 weeks.  . triamterene-hydrochlorothiazide (DYAZIDE) 37.5-25 MG per capsule Take 1 capsule by mouth daily.    No facility-administered encounter  medications on file as of 05/31/2020.    Allergies (verified) Erythromycin base   History: Past Medical History:  Diagnosis Date  . GERD (gastroesophageal reflux disease)   . History of TIA (transient ischemic attack)   . Hypothyroidism   . Iron deficiency anemia   . Meniere disease   . Mixed hyperlipidemia   . Osteopenia   . Seasonal allergies    Past Surgical History:  Procedure Laterality Date  . CATARACT  EXTRACTION W/PHACO Left 03/23/2020   Procedure: CATARACT EXTRACTION PHACO AND INTRAOCULAR LENS PLACEMENT ([place]) LEFT;  Surgeon: Leandrew Koyanagi, MD;  Location: [place];  Service: Ophthalmology;  Laterality: Left;  6.35 1:10.9 9.0%  . CATARACT EXTRACTION W/PHACO Right 04/13/2020   Procedure: CATARACT EXTRACTION PHACO AND INTRAOCULAR LENS PLACEMENT ([facility]) RIGHT 9.94 01:39.7 10.0%;  Surgeon: Leandrew Koyanagi, MD;  Location: [place];  Service: Ophthalmology;  Laterality: Right;  . eye surgery    . mole removed    . TONSILLECTOMY     Family History  Problem Relation Age of Onset  . Hypertension Father   . Lung cancer Father   . Lymphoma Brother   . Breast cancer Maternal Grandmother 80  . Melanoma Brother   . Stroke Mother   . Ovarian cancer Neg Hx   . Colon cancer Neg Hx   . Diabetes Neg Hx   . Heart disease Neg Hx    Social History   Socioeconomic History  . Marital status: Married    Spouse name: Not on file  . Number of children: Not on file  . Years of education: masters  . Highest education level: Master's degree (e.g., MA, MS, MEng, MEd, MSW, MBA)  Occupational History  . Occupation: retired  Tobacco Use  . Smoking status: Never Smoker  . Smokeless tobacco: Never Used  Vaping Use  . Vaping Use: Never used  Substance and Sexual Activity  . Alcohol use: Yes    Comment: rare ( wine- maybe once a year )  . Drug use: No  . Sexual activity: Yes    Birth control/protection: Post-menopausal  Other Topics Concern  . Not on file  Social History Narrative  . Not on file   Social Determinants of Health   Financial Resource Strain: Low Risk   . Difficulty of Paying Living Expenses: Not hard at all  Food Insecurity: No Food Insecurity  . Worried About Charity fundraiser in the Last Year: Never true  . Ran Out of Food in the Last Year: Never true  Transportation Needs: No Transportation Needs  . Lack of Transportation (Medical): No  . Lack  of Transportation (Non-Medical): No  Physical Activity: Inactive  . Days of Exercise per Week: 0 days  . Minutes of Exercise per Session: 0 min  Stress: No Stress Concern Present  . Feeling of Stress : Not at all  Social Connections: Not on file    Tobacco Counseling Counseling given: Not Answered   Clinical Intake:  Pre-visit preparation completed: Yes  Pain : No/denies pain     Nutritional Status: BMI 25 -29 Overweight Nutritional Risks: None Diabetes: No  How often do you need to have someone help you when you read instructions, pamphlets, or other written materials from your doctor or pharmacy?: 1 - Never What is the last grade level you completed in school?: master's degree  Diabetic? no  Interpreter Needed?: No  Information entered by :: NAllen LPN   Activities of Daily Living In your present state  of health, do you have any difficulty performing the following activities: 05/31/2020 04/13/2020  Hearing? Y N  Comment off and on -  Vision? N N  Difficulty concentrating or making decisions? N N  Walking or climbing stairs? N N  Dressing or bathing? N N  Doing errands, shopping? N -  Preparing Food and eating ? N -  Using the Toilet? N -  In the past six months, have you accidently leaked urine? Y -  Comment prolapse -  Do you have problems with loss of bowel control? N -  Managing your Medications? N -  Managing your Finances? N -  Housekeeping or managing your Housekeeping? N -  Some recent data might be hidden    Patient Care Team: Malfi, Lupita Raider, FNP as PCP - General (Family Medicine) Sanjuana Kava, MD as Referring Physician (Otolaryngology) Carloyn Manner, MD as Referring Physician (Otolaryngology) Vanita Ingles, RN as Case Manager (General Practice)  Indicate any recent Medical Services you may have received from other than [facility] providers in the past year (date may be approximate).     Assessment:   This is a routine wellness  examination for Cedricka.  Hearing/Vision screen  Hearing Screening   125Hz  250Hz  500Hz  1000Hz  2000Hz  3000Hz  4000Hz  6000Hz  8000Hz   Right ear:           Left ear:           Vision Screening Comments: Regular eye exams, [facility]  Dietary issues and exercise activities discussed: Current Exercise Habits: The patient does not participate in regular exercise at present  Goals    .  DIET - INCREASE WATER INTAKE      Recommend drinking at least 5-6 glasses of water a day    .  Patient Stated      05/31/2020,get a regular exercise routine and cut back on ice cream    .  RNCM: Pt-"I want to know how to lowermy cholestorl without using statins" (pt-stated)      CARE PLAN ENTRY (see longtitudinal plan of care for additional care plan information)  Current Barriers:  . Chronic Disease Management support, education, and care coordination needs related to HLD and Atherosclerosis  Clinical Goal(s) related to HLD and Atherosclerosis :  Over the next 120 days, patient will:  . Work with the care management team to address educational, disease management, and care coordination needs  . Begin or continue self health monitoring activities as directed today  adhere to a Heart Healthy Diet . Call provider office for new or worsened signs and symptoms New or worsened symptom related to HLD and Atherosclerosis   . Call care management team with questions or concerns . Verbalize basic understanding of patient centered plan of care established today  Interventions related to HLD and Atherosclerosis  :  . Evaluation of current treatment plans and patient's adherence to plan as established by provider . Assessed patient understanding of disease states . Assessed patient's education and care coordination needs . Provided disease specific education to patient- provided the patient with low cholesterol food options. The patient does not wish to take statins and wants natural ways to reduce cholesterol.   . Education about the My Chart functionality for education and viewing reports. Also the EMMI program to send information to the patient by. Will send information on lowering cholesterol via Emmi  . Collaborated with appropriate clinical care team members regarding patient needs  Patient Self Care Activities related to HLD and  Athersclerosis :  . Patient is unable to independently self-manage chronic health conditions  Initial goal documentation       Depression Screen PHQ 2/9 Scores 05/31/2020 07/09/2019 05/26/2019 03/11/2019 03/26/2017 04/28/2015  PHQ - 2 Score 0 0 1 0 0 0    Fall Risk Fall Risk  05/31/2020 05/26/2019 03/11/2019 03/26/2017 04/28/2015  Falls in the past year? 0 0 0 No No  Number falls in past yr: - 0 - - -  Injury with Fall? - 0 - - -  Risk for fall due to : Medication side effect - - - -  Follow up Falls evaluation completed;Education provided;Falls prevention discussed - - - -    FALL RISK PREVENTION PERTAINING TO THE HOME:  Any stairs in or around the home? Yes  If so, are there any without handrails? Yes  Home free of loose throw rugs in walkways, pet beds, electrical cords, etc? Yes  Adequate lighting in your home to reduce risk of falls? Yes   ASSISTIVE DEVICES UTILIZED TO PREVENT FALLS:  Life alert? No  Use of a cane, walker or w/c? No  Grab bars in the bathroom? Yes  Shower chair or bench in shower? No  Elevated toilet seat or a handicapped toilet? Yes   TIMED UP AND GO:  Was the test performed? No .   Cognitive Function:     6CIT Screen 05/31/2020  What Year? 0 points  What month? 0 points  What time? 0 points  Count back from 20 0 points  Months in reverse 0 points  Repeat phrase 0 points  Total Score 0    Immunizations Immunization History  Administered Date(s) Administered  . Fluad Quad(high Dose 65+) 03/11/2019  . Influenza, High Dose Seasonal PF 04/28/2015, 04/11/2017, 04/28/2018  . Influenza,inj,Quad PF,6+ Mos 01/12/2013    TDAP  status: Up to date  Flu Vaccine status: Due, Education has been provided regarding the importance of this vaccine. Advised may receive this vaccine at local pharmacy or Health Dept. Aware to provide a copy of the vaccination record if obtained from local pharmacy or Health Dept. Verbalized acceptance and understanding.  Pneumococcal vaccine status: Due, Education has been provided regarding the importance of this vaccine. Advised may receive this vaccine at local pharmacy or Health Dept. Aware to provide a copy of the vaccination record if obtained from local pharmacy or Health Dept. Verbalized acceptance and understanding.  Covid-19 vaccine status: Declined, Education has been provided regarding the importance of this vaccine but patient still declined. Advised may receive this vaccine at local pharmacy or Health Dept.or vaccine clinic. Aware to provide a copy of the vaccination record if obtained from local pharmacy or Health Dept. Verbalized acceptance and understanding.  Qualifies for Shingles Vaccine? Yes   Zostavax completed Yes   Shingrix Completed?: No.    Education has been provided regarding the importance of this vaccine. Patient has been advised to call insurance company to determine out of pocket expense if they have not yet received this vaccine. Advised may also receive vaccine at local pharmacy or Health Dept. Verbalized acceptance and understanding.  Screening Tests Health Maintenance  Topic Date Due  . Hepatitis C Screening  Never done  . COVID-19 Vaccine (1) Never done  . PNA vac Low Risk Adult (1 of 2 - PCV13) Never done  . INFLUENZA VACCINE  12/06/2019  . TETANUS/TDAP  05/07/2021  . MAMMOGRAM  10/20/2021  . COLONOSCOPY (Pts 45-38yrs Insurance coverage will need to be confirmed)  05/08/2023  .  DEXA SCAN  Completed    Health Maintenance  Health Maintenance Due  Topic Date Due  . Hepatitis C Screening  Never done  . COVID-19 Vaccine (1) Never done  . PNA vac Low Risk  Adult (1 of 2 - PCV13) Never done  . INFLUENZA VACCINE  12/06/2019    Colorectal cancer screening: Type of screening: Colonoscopy. Completed 05/07/2013. Repeat every 10 years  Mammogram status: Completed 10/21/2019.Marland Kitchen Repeat every year  Bone Density status: Completed 07/16/2018. Results reflect: Bone density results: OSTEOPENIA. Repeat every 3 years.  Lung Cancer Screening: (Low Dose CT Chest recommended if Age 1-80 years, 30 pack-year currently smoking OR have quit w/in 15years.) does not qualify.   Lung Cancer Screening Referral: no  Additional Screening:  Hepatitis C Screening: does qualify; due  Vision Screening: Recommended annual ophthalmology exams for early detection of glaucoma and other disorders of the eye. Is the patient up to date with their annual eye exam?  Yes  Who is the provider or what is the name of the office in which the patient attends annual eye exams? [facility] If pt is not established with a provider, would they like to be referred to a provider to establish care? No .   Dental Screening: Recommended annual dental exams for proper oral hygiene  Community Resource Referral / Chronic Care Management: CRR required this visit?  No   CCM required this visit?  No      Plan:     I have personally reviewed and noted the following in the patient's chart:   . Medical and social history . Use of alcohol, tobacco or illicit drugs  . Current medications and supplements . Functional ability and status . Nutritional status . Physical activity . Advanced directives . List of other physicians . Hospitalizations, surgeries, and ER visits in previous 12 months . Vitals . Screenings to include cognitive, depression, and falls . Referrals and appointments  In addition, I have reviewed and discussed with patient certain preventive protocols, quality metrics, and best practice recommendations. A written personalized care plan for preventive services as well  as general preventive health recommendations were provided to patient.     Kellie Simmering, LPN   [id number]   Nurse Notes:

## 2020-05-31 NOTE — Progress Notes (Unsigned)
bac

## 2020-05-31 NOTE — Telephone Encounter (Signed)
Patient called to ask the doctor or nurse about the medication she just prescribed for her eye.  She stated that on the med. Bottle it says should not be used for the eye and the pharmacist does not recommend it for the eye.  Please advise and call patient to discuss at [phone] or [phone]

## 2020-05-31 NOTE — Telephone Encounter (Signed)
Changed to a combination topical eye ointment.

## 2020-05-31 NOTE — Telephone Encounter (Signed)
Patient notified

## 2020-05-31 NOTE — Patient Instructions (Signed)
Michelle Fuentes , Thank you for taking time to come for your Medicare Wellness Visit. I appreciate your ongoing commitment to your health goals. Please review the following plan we discussed and let me know if I can assist you in the future.   Screening recommendations/referrals: Colonoscopy: completed 05/07/2013, due 05/08/2023 Mammogram: completed 10/21/2019, due 10/20/2020 Bone Density: completed 07/16/2018 Recommended yearly ophthalmology/optometry visit for glaucoma screening and checkup Recommended yearly dental visit for hygiene and checkup  Vaccinations: Influenza vaccine: due Pneumococcal vaccine: due Tdap vaccine: completed 01/31/2011, due 01/30/2021 Shingles vaccine: discussed   Covid-19: decline  Advanced directives: Please bring a copy of your POA (Power of Attorney) and/or Living Will to your next appointment.   Conditions/risks identified: none  Next appointment: Follow up in one year for your annual wellness visit    Preventive Care 65 Years and Older, Female Preventive care refers to lifestyle choices and visits with your health care provider that can promote health and wellness. What does preventive care include?  A yearly physical exam. This is also called an annual well check.  Dental exams once or twice a year.  Routine eye exams. Ask your health care provider how often you should have your eyes checked.  Personal lifestyle choices, including:  Daily care of your teeth and gums.  Regular physical activity.  Eating a healthy diet.  Avoiding tobacco and drug use.  Limiting alcohol use.  Practicing safe sex.  Taking low-dose aspirin every day.  Taking vitamin and mineral supplements as recommended by your health care provider. What happens during an annual well check? The services and screenings done by your health care provider during your annual well check will depend on your age, overall health, lifestyle risk factors, and family history of  disease. Counseling  Your health care provider may ask you questions about your:  Alcohol use.  Tobacco use.  Drug use.  Emotional well-being.  Home and relationship well-being.  Sexual activity.  Eating habits.  History of falls.  Memory and ability to understand (cognition).  Work and work Statistician.  Reproductive health. Screening  You may have the following tests or measurements:  Height, weight, and BMI.  Blood pressure.  Lipid and cholesterol levels. These may be checked every 5 years, or more frequently if you are over 59 years old.  Skin check.  Lung cancer screening. You may have this screening every year starting at age 72 if you have a 30-pack-year history of smoking and currently smoke or have quit within the past 15 years.  Fecal occult blood test (FOBT) of the stool. You may have this test every year starting at age 27.  Flexible sigmoidoscopy or colonoscopy. You may have a sigmoidoscopy every 5 years or a colonoscopy every 10 years starting at age 3.  Hepatitis C blood test.  Hepatitis B blood test.  Sexually transmitted disease (STD) testing.  Diabetes screening. This is done by checking your blood sugar (glucose) after you have not eaten for a while (fasting). You may have this done every 1-3 years.  Bone density scan. This is done to screen for osteoporosis. You may have this done starting at age 54.  Mammogram. This may be done every 1-2 years. Talk to your health care provider about how often you should have regular mammograms. Talk with your health care provider about your test results, treatment options, and if necessary, the need for more tests. Vaccines  Your health care provider may recommend certain vaccines, such as:  Influenza vaccine. This  is recommended every year.  Tetanus, diphtheria, and acellular pertussis (Tdap, Td) vaccine. You may need a Td booster every 10 years.  Zoster vaccine. You may need this after age  83.  Pneumococcal 13-valent conjugate (PCV13) vaccine. One dose is recommended after age 49.  Pneumococcal polysaccharide (PPSV23) vaccine. One dose is recommended after age 21. Talk to your health care provider about which screenings and vaccines you need and how often you need them. This information is not intended to replace advice given to you by your health care provider. Make sure you discuss any questions you have with your health care provider. Document Released: 05/20/2015 Document Revised: 01/11/2016 Document Reviewed: 02/22/2015 Elsevier Interactive Patient Education  2017 [place] Prevention in the Home Falls can cause injuries. They can happen to people of all ages. There are many things you can do to make your home safe and to help prevent falls. What can I do on the outside of my home?  Regularly fix the edges of walkways and driveways and fix any cracks.  Remove anything that might make you trip as you walk through a door, such as a raised step or threshold.  Trim any bushes or trees on the path to your home.  Use bright outdoor lighting.  Clear any walking paths of anything that might make someone trip, such as rocks or tools.  Regularly check to see if handrails are loose or broken. Make sure that both sides of any steps have handrails.  Any raised decks and porches should have guardrails on the edges.  Have any leaves, snow, or ice cleared regularly.  Use sand or salt on walking paths during winter.  Clean up any spills in your garage right away. This includes oil or grease spills. What can I do in the bathroom?  Use night lights.  Install grab bars by the toilet and in the tub and shower. Do not use towel bars as grab bars.  Use non-skid mats or decals in the tub or shower.  If you need to sit down in the shower, use a plastic, non-slip stool.  Keep the floor dry. Clean up any water that spills on the floor as soon as it happens.  Remove  soap buildup in the tub or shower regularly.  Attach bath mats securely with double-sided non-slip rug tape.  Do not have throw rugs and other things on the floor that can make you trip. What can I do in the bedroom?  Use night lights.  Make sure that you have a light by your bed that is easy to reach.  Do not use any sheets or blankets that are too big for your bed. They should not hang down onto the floor.  Have a firm chair that has side arms. You can use this for support while you get dressed.  Do not have throw rugs and other things on the floor that can make you trip. What can I do in the kitchen?  Clean up any spills right away.  Avoid walking on wet floors.  Keep items that you use a lot in easy-to-reach places.  If you need to reach something above you, use a strong step stool that has a grab bar.  Keep electrical cords out of the way.  Do not use floor polish or wax that makes floors slippery. If you must use wax, use non-skid floor wax.  Do not have throw rugs and other things on the floor that can make you  trip. What can I do with my stairs?  Do not leave any items on the stairs.  Make sure that there are handrails on both sides of the stairs and use them. Fix handrails that are broken or loose. Make sure that handrails are as long as the stairways.  Check any carpeting to make sure that it is firmly attached to the stairs. Fix any carpet that is loose or worn.  Avoid having throw rugs at the top or bottom of the stairs. If you do have throw rugs, attach them to the floor with carpet tape.  Make sure that you have a light switch at the top of the stairs and the bottom of the stairs. If you do not have them, ask someone to add them for you. What else can I do to help prevent falls?  Wear shoes that:  Do not have high heels.  Have rubber bottoms.  Are comfortable and fit you well.  Are closed at the toe. Do not wear sandals.  If you use a  stepladder:  Make sure that it is fully opened. Do not climb a closed stepladder.  Make sure that both sides of the stepladder are locked into place.  Ask someone to hold it for you, if possible.  Clearly mark and make sure that you can see:  Any grab bars or handrails.  First and last steps.  Where the edge of each step is.  Use tools that help you move around (mobility aids) if they are needed. These include:  Canes.  Walkers.  Scooters.  Crutches.  Turn on the lights when you go into a dark area. Replace any light bulbs as soon as they burn out.  Set up your furniture so you have a clear path. Avoid moving your furniture around.  If any of your floors are uneven, fix them.  If there are any pets around you, be aware of where they are.  Review your medicines with your doctor. Some medicines can make you feel dizzy. This can increase your chance of falling. Ask your doctor what other things that you can do to help prevent falls. This information is not intended to replace advice given to you by your health care provider. Make sure you discuss any questions you have with your health care provider. Document Released: 02/17/2009 Document Revised: 09/29/2015 Document Reviewed: 05/28/2014 Elsevier Interactive Patient Education  2017 [employer].

## 2020-06-06 DIAGNOSIS — H903 Sensorineural hearing loss, bilateral: Secondary | ICD-10-CM | POA: Diagnosis not present

## 2020-06-06 DIAGNOSIS — H6123 Impacted cerumen, bilateral: Secondary | ICD-10-CM | POA: Diagnosis not present

## 2020-10-26 ENCOUNTER — Telehealth: Payer: Self-pay

## 2020-10-26 DIAGNOSIS — E039 Hypothyroidism, unspecified: Secondary | ICD-10-CM

## 2020-10-26 MED ORDER — LEVOTHYROXINE SODIUM 75 MCG PO TABS: 75.0000 ug | ORAL_TABLET | Freq: Every day | ORAL | 0 refills | Status: DC

## 2020-10-26 NOTE — Telephone Encounter (Signed)
refilled 

## 2020-10-26 NOTE — Addendum Note (Signed)
Addended by: Jearld Fenton on: 10/26/2020 08:50 AM   Modules accepted: Orders

## 2020-10-26 NOTE — Telephone Encounter (Signed)
Pt is requesting a refill on levothyroxine

## 2020-10-27 ENCOUNTER — Other Ambulatory Visit: Payer: Self-pay

## 2020-10-31 ENCOUNTER — Ambulatory Visit (INDEPENDENT_AMBULATORY_CARE_PROVIDER_SITE_OTHER): Payer: Medicare HMO | Admitting: Internal Medicine

## 2020-10-31 ENCOUNTER — Other Ambulatory Visit: Payer: Self-pay

## 2020-10-31 ENCOUNTER — Encounter: Payer: Self-pay | Admitting: Internal Medicine

## 2020-10-31 VITALS — BP 144/73 | HR 84 | Temp 96.6°F | Ht 66.0 in | Wt 162.0 lb

## 2020-10-31 DIAGNOSIS — D508 Other iron deficiency anemias: Secondary | ICD-10-CM | POA: Diagnosis not present

## 2020-10-31 DIAGNOSIS — I6523 Occlusion and stenosis of bilateral carotid arteries: Secondary | ICD-10-CM

## 2020-10-31 DIAGNOSIS — Z8673 Personal history of transient ischemic attack (TIA), and cerebral infarction without residual deficits: Secondary | ICD-10-CM | POA: Diagnosis not present

## 2020-10-31 DIAGNOSIS — E785 Hyperlipidemia, unspecified: Secondary | ICD-10-CM

## 2020-10-31 DIAGNOSIS — T466X5A Adverse effect of antihyperlipidemic and antiarteriosclerotic drugs, initial encounter: Secondary | ICD-10-CM

## 2020-10-31 DIAGNOSIS — H8103 Meniere's disease, bilateral: Secondary | ICD-10-CM

## 2020-10-31 DIAGNOSIS — Z1159 Encounter for screening for other viral diseases: Secondary | ICD-10-CM

## 2020-10-31 DIAGNOSIS — M8588 Other specified disorders of bone density and structure, other site: Secondary | ICD-10-CM

## 2020-10-31 DIAGNOSIS — E039 Hypothyroidism, unspecified: Secondary | ICD-10-CM | POA: Diagnosis not present

## 2020-10-31 DIAGNOSIS — Z0001 Encounter for general adult medical examination with abnormal findings: Secondary | ICD-10-CM

## 2020-10-31 DIAGNOSIS — M791 Myalgia, unspecified site: Secondary | ICD-10-CM | POA: Diagnosis not present

## 2020-10-31 DIAGNOSIS — K219 Gastro-esophageal reflux disease without esophagitis: Secondary | ICD-10-CM | POA: Diagnosis not present

## 2020-10-31 DIAGNOSIS — J301 Allergic rhinitis due to pollen: Secondary | ICD-10-CM

## 2020-10-31 MED ORDER — FLUTICASONE PROPIONATE 50 MCG/ACT NA SUSP: NASAL | 3 refills | Status: DC

## 2020-10-31 NOTE — Assessment & Plan Note (Signed)
TSH and free T4 today Will adjust Levothyroxine if needed based on labs 

## 2020-10-31 NOTE — Assessment & Plan Note (Signed)
Continue Prednisone and Triamterene HCT as prescribed Continue Valium and Meclizine as needed She will continue to follow with ENT

## 2020-10-31 NOTE — Patient Instructions (Signed)
Heart-Healthy Eating Plan Many factors influence your heart (coronary) health, including eating and exercise habits. Coronary risk increases with abnormal blood fat (lipid) levels. Heart-healthy meal planning includes limiting unhealthy fats,increasing healthy fats, and making other diet and lifestyle changes. What is my plan? Your health care provider may recommend that you: Limit your fat intake to _________% or less of your total calories each day. Limit your saturated fat intake to _________% or less of your total calories each day. Limit the amount of cholesterol in your diet to less than _________ mg per day. What are tips for following this plan? Cooking Cook foods using methods other than frying. Baking, boiling, grilling, and broiling are all good options. Other ways to reduce fat include: Removing the skin from poultry. Removing all visible fats from meats. Steaming vegetables in water or broth. Meal planning  At meals, imagine dividing your plate into fourths: Fill one-half of your plate with vegetables and green salads. Fill one-fourth of your plate with whole grains. Fill one-fourth of your plate with lean protein foods. Eat 4-5 servings of vegetables per day. One serving equals 1 cup raw or cooked vegetable, or 2 cups raw leafy greens. Eat 4-5 servings of fruit per day. One serving equals 1 medium whole fruit,  cup dried fruit,  cup fresh, frozen, or canned fruit, or  cup 100% fruit juice. Eat more foods that contain soluble fiber. Examples include apples, broccoli, carrots, beans, peas, and barley. Aim to get 25-30 g of fiber per day. Increase your consumption of legumes, nuts, and seeds to 4-5 servings per week. One serving of dried beans or legumes equals  cup cooked, 1 serving of nuts is  cup, and 1 serving of seeds equals 1 tablespoon.  Fats Choose healthy fats more often. Choose monounsaturated and polyunsaturated fats, such as olive and canola oils, flaxseeds,  walnuts, almonds, and seeds. Eat more omega-3 fats. Choose salmon, mackerel, sardines, tuna, flaxseed oil, and ground flaxseeds. Aim to eat fish at least 2 times each week. Check food labels carefully to identify foods with trans fats or high amounts of saturated fat. Limit saturated fats. These are found in animal products, such as meats, butter, and cream. Plant sources of saturated fats include palm oil, palm kernel oil, and coconut oil. Avoid foods with partially hydrogenated oils in them. These contain trans fats. Examples are stick margarine, some tub margarines, cookies, crackers, and other baked goods. Avoid fried foods. General information Eat more home-cooked food and less restaurant, buffet, and fast food. Limit or avoid alcohol. Limit foods that are high in starch and sugar. Lose weight if you are overweight. Losing just 5-10% of your body weight can help your overall health and prevent diseases such as diabetes and heart disease. Monitor your salt (sodium) intake, especially if you have high blood pressure. Talk with your health care provider about your sodium intake. Try to incorporate more vegetarian meals weekly. What foods can I eat? Fruits All fresh, canned (in natural juice), or frozen fruits. Vegetables Fresh or frozen vegetables (raw, steamed, roasted, or grilled). Green salads. Grains Most grains. Choose whole wheat and whole grains most of the time. Rice andpasta, including brown rice and pastas made with whole wheat. Meats and other proteins Lean, well-trimmed beef, veal, pork, and lamb. Chicken and turkey without skin. All fish and shellfish. Wild duck, rabbit, pheasant, and venison. Egg whites or low-cholesterol egg substitutes. Dried beans, peas, lentils, and tofu. Seedsand most nuts. Dairy Low-fat or nonfat cheeses, including ricotta   and mozzarella. Skim or 1% milk (liquid, powdered, or evaporated). Buttermilk made with low-fat milk. Nonfat orlow-fat yogurt. Fats  and oils Non-hydrogenated (trans-free) margarines. Vegetable oils, including soybean, sesame, sunflower, olive, peanut, safflower, corn, canola, and cottonseed. Salad dressings or mayonnaisemade with a vegetable oil. Beverages Water (mineral or sparkling). Coffee and tea. Diet carbonated beverages. Sweets and desserts Sherbet, gelatin, and fruit ice. Small amounts of dark chocolate. Limit all sweets and desserts. Seasonings and condiments All seasonings and condiments. The items listed above may not be a complete list of foods and beverages you can eat. Contact a dietitian for more options. What foods are not recommended? Fruits Canned fruit in heavy syrup. Fruit in cream or butter sauce. Fried fruit. Limitcoconut. Vegetables Vegetables cooked in cheese, cream, or butter sauce. Fried vegetables. Grains Breads made with saturated or trans fats, oils, or whole milk. Croissants. Sweet rolls. Donuts. High-fat crackers,such as cheese crackers. Meats and other proteins Fatty meats, such as hot dogs, ribs, sausage, bacon, rib-eye roast or steak. High-fat deli meats, such as salami and bologna. Caviar. Domestic duck andgoose. Organ meats, such as liver. Dairy Cream, sour cream, cream cheese, and creamed cottage cheese. Whole milk cheeses. Whole or 2% milk (liquid, evaporated, or condensed). Whole buttermilk.Cream sauce or high-fat cheese sauce. Whole-milk yogurt. Fats and oils Meat fat, or shortening. Cocoa butter, hydrogenated oils, palm oil, coconut oil, palm kernel oil. Solid fats and shortenings, including bacon fat, salt pork, lard, and butter. Nondairy cream substitutes. Salad dressings with cheeseor sour cream. Beverages Regular sodas and any drinks with added sugar. Sweets and desserts Frosting. Pudding. Cookies. Cakes. Pies. Milk chocolate or white chocolate.Buttered syrups. Full-fat ice cream or ice cream drinks. The items listed above may not be a complete list of foods and beverages to  avoid. Contact a dietitian for more information. Summary Heart-healthy meal planning includes limiting unhealthy fats, increasing healthy fats, and making other diet and lifestyle changes. Lose weight if you are overweight. Losing just 5-10% of your body weight can help your overall health and prevent diseases such as diabetes and heart disease. Focus on eating a balance of foods, including fruits and vegetables, low-fat or nonfat dairy, lean protein, nuts and legumes, whole grains, and heart-healthy oils and fats. This information is not intended to replace advice given to you by your health care provider. Make sure you discuss any questions you have with your healthcare provider. Document Revised: 05/31/2017 Document Reviewed: 05/31/2017 Elsevier Patient Education  2022 [employer]  

## 2020-10-31 NOTE — Assessment & Plan Note (Signed)
C-Met and lipid profile today She declines statin therapy Reinforced low-fat diet

## 2020-10-31 NOTE — Assessment & Plan Note (Signed)
CBC today.  

## 2020-10-31 NOTE — Assessment & Plan Note (Signed)
Encouraged daily use of Calcium and Vitamin D Encouraged daily weightbearing exercise

## 2020-10-31 NOTE — Assessment & Plan Note (Signed)
She declines statin therapy No residual effect Will monitor

## 2020-10-31 NOTE — Assessment & Plan Note (Signed)
She declines statin therapy Encouraged her to consume a low-fat diet C-Met and lipid profile today

## 2020-10-31 NOTE — Progress Notes (Signed)
Subjective:    Patient ID: Michelle Fuentes, female    DOB: 01-Sep-1945, 75 y.o.   MRN: [id number]  HPI  Patient presents to clinic today for her annual exam.  She is also due to follow-up chronic conditions.  GERD: She is not sure what triggers this. She denies breakthrough on Omeprazole/Tagamet.  There is no upper GI on file.  Hypothyroidism: She denies any issues on her current dose of Levothyroxine.  She does not follow with endocrinology.  Mnire's disease: Managed on prednisone, triamterene HCTZ.  She takes Valium and Meclizine as needed.  She is not following with ENT.  HLD with Carotid Atherosclerosis, History of TIA: Her last LDL was 164, triglycerides 114, 10/2019.  She is statin intolerant due to myalgias.  She tries to consume a low-fat diet.  Osteopenia: Bone density from 03/2019 reviewed.  She is taking Calcium and Vitamin D OTC as prescribed.  She does not try to get weight bearing exercise daily.  Iron Deficiency Anemia: Her last H/H was 14.7/43.5, 10/2019.  She is not currently taking oral iron supplement.  She does not follow with hematology.  Flu: 03/2019 Tetanus: unsure COVID: never Pneumovax: unsure Prevnar: unsure Zostovax: Had it but not sure when Shingrix: never Pap smear: no longer screening Mammogram: 10/2019 Bone density: 03/2019 Colon screening: 2016, [place] screening: annually Dentist: biannually  Diet: She does eat meat. She consumes fruits and veggies. She tries to avoid fried foods. She drinks coffee, water and tea. Exercise: None  Review of Systems     Past Medical History:  Diagnosis Date   GERD (gastroesophageal reflux disease)    History of TIA (transient ischemic attack)    Hypothyroidism    Iron deficiency anemia    Meniere disease    Mixed hyperlipidemia    Osteopenia    Seasonal allergies     Current Outpatient Medications  Medication Sig Dispense Refill   bacitracin 500 UNIT/GM ointment Apply 1  application topically 2 (two) times daily. 15 g 0   bacitracin-polymyxin b (POLYSPORIN) ophthalmic ointment Place 1 application into the right eye every 12 (twelve) hours. apply to eye every 12 hours while awake 3.5 g 0   Cholecalciferol 1000 UNITS capsule Take 1,000 Units by mouth daily.      cimetidine (TAGAMET) 200 MG tablet Take 200 mg by mouth 2 (two) times daily.     diazepam (VALIUM) 2 MG tablet Take 2 mg by mouth every 6 (six) hours as needed.      fluticasone (FLONASE) 50 MCG/ACT nasal spray SPRAY 2 SPRAYS INTO EACH NOSTRIL EVERY DAY 48 g 3   levothyroxine (SYNTHROID) 75 MCG tablet Take 1 tablet (75 mcg total) by mouth daily. 90 tablet 0   loratadine (CLARITIN) 10 MG tablet Take 10 mg daily by mouth.     Lutein 6 MG CAPS Take 10 mg by mouth daily.      meclizine (ANTIVERT) 25 MG tablet Take 25 mg by mouth once as needed.      omeprazole (PRILOSEC) 20 MG capsule Take 20 mg by mouth daily.      predniSONE (DELTASONE) 10 MG tablet Take 10 mg by mouth once a week.      triamcinolone cream (KENALOG) 0.5 % Apply 1 application topically 2 (two) times daily. To affected areas, for up to 2 weeks. 30 g 1   triamterene-hydrochlorothiazide (DYAZIDE) 37.5-25 MG per capsule Take 1 capsule by mouth daily.      benzonatate (TESSALON) 100 MG  capsule Take 1 capsule (100 mg total) by mouth 2 (two) times daily as needed for cough. 20 capsule 1   Ginkgo Biloba Extract 60 MG CAPS Take 120 mg by mouth.  (Patient not taking: Reported on 10/31/2020)     Magnesium Gluconate 250 MG TABS Take 250 mg by mouth daily.  (Patient not taking: No sig reported)     No current facility-administered medications for this visit.    Allergies  Allergen Reactions   Erythromycin Base     Other reaction(s): Other (See Comments) Made her tongue sore    Family History  Problem Relation Age of Onset   Stroke Mother    Hypertension Father    Lung cancer Father    Lymphoma Brother    Melanoma Brother    Breast cancer  Maternal Grandmother 55   Ovarian cancer Neg Hx    Colon cancer Neg Hx    Diabetes Neg Hx    Heart disease Neg Hx     Social History   Socioeconomic History   Marital status: Married    Spouse name: Not on file   Number of children: Not on file   Years of education: masters   Highest education level: Master's degree (e.g., MA, MS, MEng, MEd, MSW, MBA)  Occupational History   Occupation: retired  Tobacco Use   Smoking status: Never   Smokeless tobacco: Never  Vaping Use   Vaping Use: Never used  Substance and Sexual Activity   Alcohol use: Yes    Comment: rare ( wine- maybe once a year )   Drug use: No   Sexual activity: Yes    Birth control/protection: Post-menopausal  Other Topics Concern   Not on file  Social History Narrative   Not on file   Social Determinants of Health   Financial Resource Strain: Low Risk    Difficulty of Paying Living Expenses: Not hard at all  Food Insecurity: No Food Insecurity   Worried About Charity fundraiser in the Last Year: Never true   [place] in the Last Year: Never true  Transportation Needs: No Transportation Needs   Lack of Transportation (Medical): No   Lack of Transportation (Non-Medical): No  Physical Activity: Inactive   Days of Exercise per Week: 0 days   Minutes of Exercise per Session: 0 min  Stress: No Stress Concern Present   Feeling of Stress : Not at all  Social Connections: Not on file  Intimate Partner Violence: Not on file     Constitutional: Denies fever, malaise, fatigue, headache or abrupt weight changes.  HEENT: Denies eye pain, eye redness, ear pain, ringing in the ears, wax buildup, runny nose, nasal congestion, bloody nose, or sore throat. Respiratory: Denies difficulty breathing, shortness of breath, cough or sputum production.   Cardiovascular: Denies chest pain, chest tightness, palpitations or swelling in the hands or feet.  Gastrointestinal: Denies abdominal pain, bloating, constipation,  diarrhea or blood in the stool.  GU: Denies urgency, frequency, pain with urination, burning sensation, blood in urine, odor or discharge. Musculoskeletal: Pt reports intermittent hip pain. Denies decrease in range of motion, difficulty with gait, muscle pain or joint swelling.  Skin: Denies redness, rashes, lesions or ulcercations.  Neurological: Pt reports intermittent dizziness. Denies difficulty with memory, difficulty with speech or problems with balance and coordination.  Psych: Denies anxiety, depression, SI/HI.  No other specific complaints in a complete review of systems (except as listed in HPI above).  Objective:  Physical Exam BP (!) 144/73 (BP Location: Right Arm, Patient Position: Sitting, Cuff Size: Normal)   Pulse 84   Temp (!) 96.6 F (35.9 C) (Temporal)   Ht 5\' 6"  (1.676 m)   Wt 162 lb (73.5 kg)   SpO2 100%   BMI 26.15 kg/m  Wt Readings from Last 3 Encounters:  10/31/20 162 lb (73.5 kg)  05/31/20 160 lb (72.6 kg)  05/30/20 160 lb 6.4 oz (72.8 kg)    General: Appears her stated age, overweight, in NAD. Skin: Warm, dry and intact. No rashes noted. HEENT: Head: normal shape and size; Eyes: sclera white and EOMs intact;  Neck:  Neck supple, trachea midline. No masses, lumps or thyromegaly present.  Cardiovascular: Normal rate and rhythm. S1,S2 noted.  No murmur, rubs or gallops noted. No JVD or BLE edema. No carotid bruits noted. Pulmonary/Chest: Normal effort and positive vesicular breath sounds. No respiratory distress. No wheezes, rales or ronchi noted.  Abdomen: Soft and nontender. Normal bowel sounds. No distention or masses noted. Liver, spleen and kidneys non palpable. Musculoskeletal: Strength 5/5 BUE/BLE. No difficulty with gait.  Neurological: Alert and oriented. Cranial nerves II-XII grossly intact. Coordination normal.  Psychiatric: Mood and affect normal. Behavior is normal. Judgment and thought content normal.     BMET    Component Value Date/Time    NA 139 10/22/2019 0816   NA 141 05/26/2015 0910   K 3.9 10/22/2019 0816   CL 100 10/22/2019 0816   CO2 30 10/22/2019 0816   GLUCOSE 88 10/22/2019 0816   BUN 20 10/22/2019 0816   BUN 19 05/26/2015 0910   CREATININE 0.77 10/22/2019 0816   CALCIUM 9.9 10/22/2019 0816   GFRNONAA 77 10/22/2019 0816   GFRAA 89 10/22/2019 0816    Lipid Panel     Component Value Date/Time   CHOL 253 (H) 10/22/2019 0816   CHOL 224 (H) 05/26/2015 0910   TRIG 114 10/22/2019 0816   HDL 66 10/22/2019 0816   HDL 75 05/26/2015 0910   CHOLHDL 3.8 10/22/2019 0816   LDLCALC 164 (H) 10/22/2019 0816    CBC    Component Value Date/Time   WBC 7.5 10/22/2019 0816   RBC 5.06 10/22/2019 0816   HGB 14.7 10/22/2019 0816   HGB 14.3 05/26/2015 0910   HCT 43.5 10/22/2019 0816   HCT 42.1 05/26/2015 0910   PLT 391 10/22/2019 0816   PLT 408 (H) 05/26/2015 0910   MCV 86.0 10/22/2019 0816   MCV 86 05/26/2015 0910   MCV 84 06/10/2014 1222   MCH 29.1 10/22/2019 0816   MCHC 33.8 10/22/2019 0816   RDW 13.0 10/22/2019 0816   RDW 13.3 05/26/2015 0910   RDW 13.2 06/10/2014 1222   LYMPHSABS 1,628 10/22/2019 0816   LYMPHSABS 1.4 05/26/2015 0910   EOSABS 248 10/22/2019 0816   EOSABS 0.3 05/26/2015 0910   BASOSABS 53 10/22/2019 0816   BASOSABS 0.0 05/26/2015 0910    Hgb A1C No results found for: HGBA1C           Assessment & Plan:   Preventative Health Maintenance:  Encouraged her to get a flu shot in the fall She declines tetanus, covid, pneumovax, prevnar or shingrix She no longer needs pap smear Encouraged her to schedule her mammogram Will repeat bone density in 2023 Encouraged her to consume a balanced diet and exercise regimen Advised her to see an eye doctor and dentist annually Will check CBC, CMET, TSH, Free T4, Lipid and Hep C today  RTC in  1 year, sooner if needed   Webb Silversmith, NP  This visit occurred during the SARS-CoV-2 public health emergency.  Safety protocols were in place,  including screening questions prior to the visit, additional usage of staff PPE, and extensive cleaning of exam room while observing appropriate contact time as indicated for disinfecting solutions.

## 2020-10-31 NOTE — Assessment & Plan Note (Signed)
Encouraged her to try to identify foods that trigger reflux and avoid them Encouraged weight loss as this can help reduce reflux symptoms Continue Omeprazole/Tagamet OTC as needed CBC and c-Met today

## 2020-10-31 NOTE — Assessment & Plan Note (Signed)
She declines statin therapy

## 2020-11-01 ENCOUNTER — Other Ambulatory Visit: Payer: Medicare HMO

## 2020-11-01 DIAGNOSIS — E785 Hyperlipidemia, unspecified: Secondary | ICD-10-CM | POA: Diagnosis not present

## 2020-11-01 DIAGNOSIS — E039 Hypothyroidism, unspecified: Secondary | ICD-10-CM | POA: Diagnosis not present

## 2020-11-01 DIAGNOSIS — D508 Other iron deficiency anemias: Secondary | ICD-10-CM | POA: Diagnosis not present

## 2020-11-01 DIAGNOSIS — Z1159 Encounter for screening for other viral diseases: Secondary | ICD-10-CM | POA: Diagnosis not present

## 2020-11-02 LAB — CBC
HCT: 43.5 % (ref 35.0–45.0)
Hemoglobin: 14.2 g/dL (ref 11.7–15.5)
MCH: 28.1 pg (ref 27.0–33.0)
MCHC: 32.6 g/dL (ref 32.0–36.0)
MCV: 86 fL (ref 80.0–100.0)
MPV: 10.5 fL (ref 7.5–12.5)
Platelets: 418 10*3/uL — ABNORMAL HIGH (ref 140–400)
RBC: 5.06 10*6/uL (ref 3.80–5.10)
RDW: 13.3 % (ref 11.0–15.0)
WBC: 8.4 10*3/uL (ref 3.8–10.8)

## 2020-11-02 LAB — COMPLETE METABOLIC PANEL WITH GFR
AG Ratio: 1.8 (calc) (ref 1.0–2.5)
ALT: 10 U/L (ref 6–29)
AST: 13 U/L (ref 10–35)
Albumin: 4.4 g/dL (ref 3.6–5.1)
Alkaline phosphatase (APISO): 59 U/L (ref 37–153)
BUN: 14 mg/dL (ref 7–25)
CO2: 27 mmol/L (ref 20–32)
Calcium: 9.7 mg/dL (ref 8.6–10.4)
Chloride: 101 mmol/L (ref 98–110)
Creat: 0.75 mg/dL (ref 0.60–0.93)
GFR, Est African American: 91 mL/min/{1.73_m2} (ref >=60)
GFR, Est Non African American: 79 mL/min/{1.73_m2} (ref >=60)
Globulin: 2.4 g/dL (calc) (ref 1.9–3.7)
Glucose, Bld: 101 mg/dL — ABNORMAL HIGH (ref 65–99)
Potassium: 3.5 mmol/L (ref 3.5–5.3)
Sodium: 138 mmol/L (ref 135–146)
Total Bilirubin: 0.6 mg/dL (ref 0.2–1.2)
Total Protein: 6.8 g/dL (ref 6.1–8.1)

## 2020-11-02 LAB — LIPID PANEL
Cholesterol: 243 mg/dL — ABNORMAL HIGH (ref <200)
HDL: 71 mg/dL (ref >=50)
LDL Cholesterol (Calc): 151 mg/dL (calc) — ABNORMAL HIGH
Non-HDL Cholesterol (Calc): 172 mg/dL (calc) — ABNORMAL HIGH (ref <130)
Total CHOL/HDL Ratio: 3.4 (calc) (ref <5.0)
Triglycerides: 99 mg/dL (ref <150)

## 2020-11-02 LAB — TSH: TSH: 1.74 mIU/L (ref 0.40–4.50)

## 2020-11-02 LAB — HEPATITIS C ANTIBODY
Hepatitis C Ab: NONREACTIVE
SIGNAL TO CUT-OFF: 0.01 (ref <1.00)

## 2020-11-02 LAB — T4, FREE: Free T4: 1.8 ng/dL (ref 0.8–1.8)

## 2020-11-29 DIAGNOSIS — H18511 Endothelial corneal dystrophy, right eye: Secondary | ICD-10-CM | POA: Diagnosis not present

## 2021-01-24 ENCOUNTER — Other Ambulatory Visit: Payer: Self-pay | Admitting: Internal Medicine

## 2021-01-24 DIAGNOSIS — E039 Hypothyroidism, unspecified: Secondary | ICD-10-CM

## 2021-01-24 MED ORDER — LEVOTHYROXINE SODIUM 75 MCG PO TABS: 75.0000 ug | ORAL_TABLET | Freq: Every day | ORAL | 0 refills | Status: DC

## 2021-01-24 NOTE — Telephone Encounter (Signed)
Copied from [place] [id number]. Topic: Quick Communication - Rx Refill/Question >> Jan 24, 2021  2:33 PM Tessa Lerner A wrote: Medication: levothyroxine (SYNTHROID) 75 MCG tablet [[id number]]   Has the patient contacted their pharmacy? No. (Agent: If no, request that the patient contact the pharmacy for the refill.) (Agent: If yes, when and what did the pharmacy advise?)  Preferred Pharmacy (with phone number or street name): [id number] - [place], [place] [address]  Phone:  [phone] Fax:  [phone]   Has the patient been seen for an appointment in the last year OR does the patient have an upcoming appointment? Yes.    Agent: Please be advised that RX refills may take up to 3 business days. We ask that you follow-up with your pharmacy.

## 2021-02-08 ENCOUNTER — Other Ambulatory Visit: Payer: Self-pay | Admitting: Internal Medicine

## 2021-02-08 ENCOUNTER — Other Ambulatory Visit: Payer: Self-pay | Admitting: Family Medicine

## 2021-02-08 DIAGNOSIS — Z1231 Encounter for screening mammogram for malignant neoplasm of breast: Secondary | ICD-10-CM

## 2021-02-21 DIAGNOSIS — H903 Sensorineural hearing loss, bilateral: Secondary | ICD-10-CM | POA: Diagnosis not present

## 2021-02-21 DIAGNOSIS — H8103 Meniere's disease, bilateral: Secondary | ICD-10-CM | POA: Diagnosis not present

## 2021-02-28 ENCOUNTER — Ambulatory Visit
Admission: RE | Admit: 2021-02-28 | Discharge: 2021-02-28 | Disposition: A | Discharge status: Home / Self Care | Payer: Medicare HMO | Source: Ambulatory Visit | Attending: Internal Medicine | Admitting: Internal Medicine

## 2021-02-28 ENCOUNTER — Other Ambulatory Visit: Payer: Self-pay

## 2021-02-28 DIAGNOSIS — Z1231 Encounter for screening mammogram for malignant neoplasm of breast: Secondary | ICD-10-CM | POA: Insufficient documentation

## 2021-04-21 ENCOUNTER — Other Ambulatory Visit: Payer: Self-pay | Admitting: Internal Medicine

## 2021-04-21 DIAGNOSIS — E039 Hypothyroidism, unspecified: Secondary | ICD-10-CM

## 2021-04-21 NOTE — Telephone Encounter (Signed)
Requested Prescriptions  Pending Prescriptions Disp Refills   levothyroxine (SYNTHROID) 75 MCG tablet [Pharmacy Med Name: LEVOTHYROXINE 75 MCG TABLET] 90 tablet 1    Sig: TAKE 1 TABLET BY MOUTH EVERY DAY     Endocrinology:  Hypothyroid Agents Failed - 04/21/2021  1:32 AM      Failed - TSH needs to be rechecked within 3 months after an abnormal result. Refill until TSH is due.      Passed - TSH in normal range and within 360 days    TSH  Date Value Ref Range Status  11/01/2020 1.74 0.40 - 4.50 mIU/L Final         Passed - Valid encounter within last 12 months    Recent Outpatient Visits          5 months ago Encounter for general adult medical examination with abnormal findings   [facility] [place], Coralie Keens, NP   10 months ago Hordeolum externum of right upper eyelid   [place], FNP   1 year ago Adult hypothyroidism   [place], FNP   1 year ago Adult hypothyroidism   [place], FNP   2 years ago Muscle strain of left scapular region, initial encounter   [place], Devonne Doughty, DO      Future Appointments            In 1 month [facility], [facility]

## 2021-06-06 ENCOUNTER — Ambulatory Visit: Payer: Medicare HMO

## 2021-10-18 ENCOUNTER — Other Ambulatory Visit: Payer: Self-pay | Admitting: Internal Medicine

## 2021-10-18 DIAGNOSIS — E039 Hypothyroidism, unspecified: Secondary | ICD-10-CM

## 2021-10-18 NOTE — Telephone Encounter (Signed)
Requested Prescriptions  Pending Prescriptions Disp Refills  . levothyroxine (SYNTHROID) 75 MCG tablet [Pharmacy Med Name: LEVOTHYROXINE 75 MCG TABLET] 90 tablet 1    Sig: TAKE 1 TABLET BY MOUTH EVERY DAY     Endocrinology:  Hypothyroid Agents Passed - 10/18/2021  1:50 AM      Passed - TSH in normal range and within 360 days    TSH  Date Value Ref Range Status  11/01/2020 1.74 0.40 - 4.50 mIU/L Final         Passed - Valid encounter within last 12 months    Recent Outpatient Visits          11 months ago Encounter for general adult medical examination with abnormal findings   [facility] [place], Coralie Keens, NP   1 year ago Hordeolum externum of right upper eyelid   [place], FNP   1 year ago Adult hypothyroidism   [facility], Lupita Raider, FNP   1 year ago Adult hypothyroidism   [place], FNP   2 years ago Muscle strain of left scapular region, initial encounter   [place], Devonne Doughty, DO      Future Appointments            In 2 weeks [facility], Coralie Keens, NP [facility], [facility]

## 2021-11-02 ENCOUNTER — Encounter: Payer: Self-pay | Admitting: Internal Medicine

## 2021-11-02 ENCOUNTER — Ambulatory Visit (INDEPENDENT_AMBULATORY_CARE_PROVIDER_SITE_OTHER): Payer: Medicare HMO

## 2021-11-02 ENCOUNTER — Ambulatory Visit (INDEPENDENT_AMBULATORY_CARE_PROVIDER_SITE_OTHER): Payer: Medicare HMO | Admitting: Internal Medicine

## 2021-11-02 VITALS — BP 140/63 | HR 86 | Temp 97.9°F | Resp 17 | Ht 64.0 in | Wt 161.6 lb

## 2021-11-02 VITALS — BP 140/63 | HR 84 | Temp 97.9°F | Resp 17 | Ht 64.0 in | Wt 161.6 lb

## 2021-11-02 DIAGNOSIS — Z0000 Encounter for general adult medical examination without abnormal findings: Secondary | ICD-10-CM

## 2021-11-02 DIAGNOSIS — D75839 Thrombocytosis, unspecified: Secondary | ICD-10-CM | POA: Diagnosis not present

## 2021-11-02 DIAGNOSIS — E663 Overweight: Secondary | ICD-10-CM | POA: Insufficient documentation

## 2021-11-02 DIAGNOSIS — E039 Hypothyroidism, unspecified: Secondary | ICD-10-CM

## 2021-11-02 DIAGNOSIS — Z6827 Body mass index (BMI) 27.0-27.9, adult: Secondary | ICD-10-CM

## 2021-11-02 DIAGNOSIS — Z0001 Encounter for general adult medical examination with abnormal findings: Secondary | ICD-10-CM

## 2021-11-02 DIAGNOSIS — Z6825 Body mass index (BMI) 25.0-25.9, adult: Secondary | ICD-10-CM | POA: Insufficient documentation

## 2021-11-02 NOTE — Patient Instructions (Signed)
Health Maintenance for Postmenopausal Women Menopause is a normal process in which your ability to get pregnant comes to an end. This process happens slowly over many months or years, usually between the ages of 48 and 55. Menopause is complete when you have missed your menstrual period for 12 months. It is important to talk with your health care provider about some of the most common conditions that affect women after menopause (postmenopausal women). These include heart disease, cancer, and bone loss (osteoporosis). Adopting a healthy lifestyle and getting preventive care can help to promote your health and wellness. The actions you take can also lower your chances of developing some of these common conditions. What are the signs and symptoms of menopause? During menopause, you may have the following symptoms: Hot flashes. These can be moderate or severe. Night sweats. Decrease in sex drive. Mood swings. Headaches. Tiredness (fatigue). Irritability. Memory problems. Problems falling asleep or staying asleep. Talk with your health care provider about treatment options for your symptoms. Do I need hormone replacement therapy? Hormone replacement therapy is effective in treating symptoms that are caused by menopause, such as hot flashes and night sweats. Hormone replacement carries certain risks, especially as you become older. If you are thinking about using estrogen or estrogen with progestin, discuss the benefits and risks with your health care provider. How can I reduce my risk for heart disease and stroke? The risk of heart disease, heart attack, and stroke increases as you age. One of the causes may be a change in the body's hormones during menopause. This can affect how your body uses dietary fats, triglycerides, and cholesterol. Heart attack and stroke are medical emergencies. There are many things that you can do to help prevent heart disease and stroke. Watch your blood pressure High  blood pressure causes heart disease and increases the risk of stroke. This is more likely to develop in people who have high blood pressure readings or are overweight. Have your blood pressure checked: Every 3-5 years if you are 18-39 years of age. Every year if you are 40 years old or older. Eat a healthy diet  Eat a diet that includes plenty of vegetables, fruits, low-fat dairy products, and lean protein. Do not eat a lot of foods that are high in solid fats, added sugars, or sodium. Get regular exercise Get regular exercise. This is one of the most important things you can do for your health. Most adults should: Try to exercise for at least 150 minutes each week. The exercise should increase your heart rate and make you sweat (moderate-intensity exercise). Try to do strengthening exercises at least twice each week. Do these in addition to the moderate-intensity exercise. Spend less time sitting. Even light physical activity can be beneficial. Other tips Work with your health care provider to achieve or maintain a healthy weight. Do not use any products that contain nicotine or tobacco. These products include cigarettes, chewing tobacco, and vaping devices, such as e-cigarettes. If you need help quitting, ask your health care provider. Know your numbers. Ask your health care provider to check your cholesterol and your blood sugar (glucose). Continue to have your blood tested as directed by your health care provider. Do I need screening for cancer? Depending on your health history and family history, you may need to have cancer screenings at different stages of your life. This may include screening for: Breast cancer. Cervical cancer. Lung cancer. Colorectal cancer. What is my risk for osteoporosis? After menopause, you may be   at increased risk for osteoporosis. Osteoporosis is a condition in which bone destruction happens more quickly than new bone creation. To help prevent osteoporosis or  the bone fractures that can happen because of osteoporosis, you may take the following actions: If you are 19-50 years old, get at least 1,000 mg of calcium and at least 600 international units (IU) of vitamin D per day. If you are older than age 50 but younger than age 70, get at least 1,200 mg of calcium and at least 600 international units (IU) of vitamin D per day. If you are older than age 70, get at least 1,200 mg of calcium and at least 800 international units (IU) of vitamin D per day. Smoking and drinking excessive alcohol increase the risk of osteoporosis. Eat foods that are rich in calcium and vitamin D, and do weight-bearing exercises several times each week as directed by your health care provider. How does menopause affect my mental health? Depression may occur at any age, but it is more common as you become older. Common symptoms of depression include: Feeling depressed. Changes in sleep patterns. Changes in appetite or eating patterns. Feeling an overall lack of motivation or enjoyment of activities that you previously enjoyed. Frequent crying spells. Talk with your health care provider if you think that you are experiencing any of these symptoms. General instructions See your health care provider for regular wellness exams and vaccines. This may include: Scheduling regular health, dental, and eye exams. Getting and maintaining your vaccines. These include: Influenza vaccine. Get this vaccine each year before the flu season begins. Pneumonia vaccine. Shingles vaccine. Tetanus, diphtheria, and pertussis (Tdap) booster vaccine. Your health care provider may also recommend other immunizations. Tell your health care provider if you have ever been abused or do not feel safe at home. Summary Menopause is a normal process in which your ability to get pregnant comes to an end. This condition causes hot flashes, night sweats, decreased interest in sex, mood swings, headaches, or lack  of sleep. Treatment for this condition may include hormone replacement therapy. Take actions to keep yourself healthy, including exercising regularly, eating a healthy diet, watching your weight, and checking your blood pressure and blood sugar levels. Get screened for cancer and depression. Make sure that you are up to date with all your vaccines. This information is not intended to replace advice given to you by your health care provider. Make sure you discuss any questions you have with your health care provider. Document Revised: 09/12/2020 Document Reviewed: 09/12/2020 Elsevier Patient Education  2023 [employer]  

## 2021-11-02 NOTE — Progress Notes (Signed)
Subjective:   Michelle Fuentes is a 76 y.o. female who presents for Medicare Annual (Subsequent) preventive examination.  Review of Systems    Per HPI unless specifically indicated above         Objective:    Today's Vitals   11/02/21 0937  PainSc: 1       11/02/2021    9:11 AM 10/31/2020    3:13 PM 05/31/2020    1:17 PM  Vitals with BMI  Height '5\' 4"'$  '5\' 6"'$  '5\' 6"'$   Weight 161 lbs 10 oz 162 lbs 160 lbs  BMI 27.72 54.62 70.35  Systolic 009 381   Diastolic 63 73   Pulse 84 84         05/31/2020    1:25 PM 04/13/2020    8:26 AM 03/23/2020    9:52 AM 05/26/2019    1:49 PM 03/26/2017    9:16 AM  Advanced Directives  Does Patient Have a Medical Advance Directive? Yes Yes Yes Yes Yes  Type of Paramedic of [place];Living will [place];Living will [place];Living will Living will;Healthcare Power of [place];Living will  Does patient want to make changes to medical advance directive?  No - Patient declined     Copy of [place] in Chart? No - copy requested No - copy requested No - copy requested No - copy requested No - copy requested    Current Medications (verified) Outpatient Encounter Medications as of 11/02/2021  Medication Sig   bacitracin 500 UNIT/GM ointment Apply 1 application topically 2 (two) times daily.   bacitracin-polymyxin b (POLYSPORIN) ophthalmic ointment Place 1 application into the right eye every 12 (twelve) hours. apply to eye every 12 hours while awake   Cholecalciferol 1000 UNITS capsule Take 1,000 Units by mouth daily.    cimetidine (TAGAMET) 200 MG tablet Take 200 mg by mouth 2 (two) times daily.   diazepam (VALIUM) 2 MG tablet Take 2 mg by mouth every 6 (six) hours as needed.    fluticasone (FLONASE) 50 MCG/ACT nasal spray SPRAY 2 SPRAYS INTO EACH NOSTRIL EVERY DAY   levothyroxine (SYNTHROID) 75 MCG tablet TAKE 1 TABLET BY MOUTH EVERY DAY    loratadine (CLARITIN) 10 MG tablet Take 10 mg daily by mouth.   Lutein 6 MG CAPS Take 10 mg by mouth daily.    meclizine (ANTIVERT) 25 MG tablet Take 25 mg by mouth once as needed.    omeprazole (PRILOSEC) 20 MG capsule Take 20 mg by mouth daily.    predniSONE (DELTASONE) 10 MG tablet Take 10 mg by mouth once a week.    triamcinolone cream (KENALOG) 0.5 % Apply 1 application topically 2 (two) times daily. To affected areas, for up to 2 weeks. (Patient not taking: Reported on 11/02/2021)   triamterene-hydrochlorothiazide (DYAZIDE) 37.5-25 MG per capsule Take 1 capsule by mouth daily.    No facility-administered encounter medications on file as of 11/02/2021.    Allergies (verified) Erythromycin base   History: Past Medical History:  Diagnosis Date   GERD (gastroesophageal reflux disease)    History of TIA (transient ischemic attack)    Hypothyroidism    Iron deficiency anemia    Meniere disease    Mixed hyperlipidemia    Osteopenia    Seasonal allergies    Past Surgical History:  Procedure Laterality Date   CATARACT EXTRACTION W/PHACO Left 03/23/2020   Procedure: CATARACT EXTRACTION PHACO AND INTRAOCULAR LENS PLACEMENT ([place])  LEFT;  Surgeon: Leandrew Koyanagi, MD;  Location: [facility];  Service: Ophthalmology;  Laterality: Left;  6.35 1:10.9 9.0%   CATARACT EXTRACTION W/PHACO Right 04/13/2020   Procedure: CATARACT EXTRACTION PHACO AND INTRAOCULAR LENS PLACEMENT ([facility]) RIGHT 9.94 01:39.7 10.0%;  Surgeon: Leandrew Koyanagi, MD;  Location: [facility];  Service: Ophthalmology;  Laterality: Right;   eye surgery     mole removed     TONSILLECTOMY     Family History  Problem Relation Age of Onset   Stroke Mother    Hypertension Father    Lung cancer Father    Lymphoma Brother    Melanoma Brother    Breast cancer Maternal Grandmother 80   Ovarian cancer Neg Hx    Colon cancer Neg Hx    Diabetes Neg Hx    Heart disease Neg Hx    Social History    Socioeconomic History   Marital status: Married    Spouse name: Laurant Nevels   Number of children: 2   Years of education: masters   Highest education level: Master's degree (e.g., MA, MS, MEng, MEd, MSW, MBA)  Occupational History   Occupation: retired  Tobacco Use   Smoking status: Never   Smokeless tobacco: Never  Vaping Use   Vaping Use: Never used  Substance and Sexual Activity   Alcohol use: Yes    Comment: rare ( wine- maybe once a year )   Drug use: No   Sexual activity: Yes    Birth control/protection: Post-menopausal  Other Topics Concern   Not on file  Social History Narrative   Not on file   Social Determinants of Health   Financial Resource Strain: Low Risk  (11/02/2021)   Overall Financial Resource Strain (CARDIA)    Difficulty of Paying Living Expenses: Not hard at all  Food Insecurity: No Food Insecurity (11/02/2021)   Hunger Vital Sign    Worried About Running Out of Food in the Last Year: Never true    Ran Out of Food in the Last Year: Never true  Transportation Needs: No Transportation Needs (11/02/2021)   PRAPARE - Hydrologist (Medical): No    Lack of Transportation (Non-Medical): No  Physical Activity: Insufficiently Active (11/02/2021)   Exercise Vital Sign    Days of Exercise per Week: 2 days    Minutes of Exercise per Session: 30 min  Stress: No Stress Concern Present (11/02/2021)   [place]    Feeling of Stress : Only a little  Social Connections: Socially Integrated (11/02/2021)   Social Connection and Isolation Panel [NHANES]    Frequency of Communication with Friends and Family: More than three times a week    Frequency of Social Gatherings with Friends and Family: More than three times a week    Attends Religious Services: More than 4 times per year    Active Member of [employer] or Organizations: Yes    Attends Music therapist:  More than 4 times per year    Marital Status: Married    Tobacco Counseling Counseling given: Not Answered   Clinical Intake:  Pre-visit preparation completed: No  Pain : 0-10 Pain Score: 1  Pain Type: Chronic pain Pain Location: Back Pain Orientation: Left Pain Descriptors / Indicators: Aching Pain Onset: More than a month ago Pain Frequency: Intermittent     Nutritional Status: BMI 25 -29 Overweight Nutritional Risks: None Diabetes: No  How often do you  need to have someone help you when you read instructions, pamphlets, or other written materials from your doctor or pharmacy?: 1 - Never  Diabetic?No   Interpreter Needed?: No  Information entered by :: Donnie Mesa, CMA   Activities of Daily Living    11/02/2021    9:24 AM  In your present state of health, do you have any difficulty performing the following activities:  Hearing? 1  Vision? 1  Difficulty concentrating or making decisions? 0  Walking or climbing stairs? 0  Dressing or bathing? 0  Doing errands, shopping? 0    Patient Care Team: Jearld Fenton, NP as PCP - General (Internal Medicine) Sanjuana Kava, MD as Referring Physician (Otolaryngology) Carloyn Manner, MD as Referring Physician (Otolaryngology) Leandrew Koyanagi, MD    Ophthalmology   Indicate any recent Medical Services you may have received from other than [facility] providers in the past year (date may be approximate). No hospitalization in the past 12 months.      Assessment:   This is a routine wellness examination for [place].  Hearing/Vision screen Meniere's Disease bilateral, Sensorineural hearing loss, bilateral.   Dietary issues and exercise activities discussed:     Goals Addressed             This Visit's Progress    Exercise 150 min/wk Moderate Activity         Depression Screen    11/02/2021    9:17 AM 10/31/2020    3:22 PM 05/31/2020    1:26 PM 07/09/2019    3:54 PM 05/26/2019    1:48 PM  03/11/2019    3:09 PM 03/26/2017    9:13 AM  PHQ 2/9 Scores  PHQ - 2 Score 1 0 0 0 1 0 0  PHQ- 9 Score  0         Fall Risk    11/02/2021    9:23 AM 10/31/2020    3:18 PM 05/31/2020    1:26 PM 05/26/2019    1:42 PM 03/11/2019    3:09 PM  [place] in the past year? 0 0 0 0 0  Number falls in past yr: 0 0  0   Injury with Fall? 0 0  0   Risk for fall due to : No Fall Risks  Medication side effect    Follow up Falls evaluation completed  Falls evaluation completed;Education provided;Falls prevention discussed      FALL RISK PREVENTION PERTAINING TO THE HOME:  Any stairs in or around the home? Yes  If so, are there any without handrails? Yes  Home free of loose throw rugs in walkways, pet beds, electrical cords, etc? Yes  Adequate lighting in your home to reduce risk of falls? Yes   ASSISTIVE DEVICES UTILIZED TO PREVENT FALLS:  Life alert? No  Use of a cane, walker or w/c? No  Grab bars in the bathroom? Yes  Shower chair or bench in shower? Yes  Elevated toilet seat or a handicapped toilet? Yes   TIMED UP AND GO:  Was the test performed? Yes .  Length of time to ambulate 10 feet: 10  sec.   Gait steady and fast without use of assistive device  Cognitive Function:        11/02/2021    9:29 AM 05/31/2020    1:30 PM  6CIT Screen  What Year? 0 points 0 points  What month? 0 points 0 points  What time? 0 points 0 points  Count back from 20 0 points 0 points  Months in reverse 0 points 0 points  Repeat phrase 0 points 0 points  Total Score 0 points 0 points    Immunizations Immunization History  Administered Date(s) Administered   Fluad Quad(high Dose 65+) 03/11/2019   Influenza, High Dose Seasonal PF 04/28/2015, 04/11/2017, 04/28/2018   Influenza,inj,Quad PF,6+ Mos 01/12/2013    TDAP status: Due, Education has been provided regarding the importance of this vaccine. Advised may receive this vaccine at local pharmacy or Health Dept. Aware to provide a copy  of the vaccination record if obtained from local pharmacy or Health Dept. Verbalized acceptance and understanding.  Flu Vaccine status: Up to date  Pneumococcal vaccine status: Due, Education has been provided regarding the importance of this vaccine. Advised may receive this vaccine at local pharmacy or Health Dept. Aware to provide a copy of the vaccination record if obtained from local pharmacy or Health Dept. Verbalized acceptance and understanding.  Covid-19 vaccine status: Declined, Education has been provided regarding the importance of this vaccine but patient still declined. Advised may receive this vaccine at local pharmacy or Health Dept.or vaccine clinic. Aware to provide a copy of the vaccination record if obtained from local pharmacy or Health Dept. Verbalized acceptance and understanding.  Qualifies for Shingles Vaccine? Yes   Zostavax completed No   Shingrix Completed?: No.    Education has been provided regarding the importance of this vaccine. Patient has been advised to call insurance company to determine out of pocket expense if they have not yet received this vaccine. Advised may also receive vaccine at local pharmacy or Health Dept. Verbalized acceptance and understanding.  Screening Tests Health Maintenance  Topic Date Due   COVID-19 Vaccine (1) Never done   Zoster Vaccines- Shingrix (1 of 2) Never done   Pneumonia Vaccine 46+ Years old (1 - PCV) Never done   TETANUS/TDAP  05/07/2021   INFLUENZA VACCINE  12/05/2021   COLONOSCOPY (Pts 45-59yr Insurance coverage will need to be confirmed)  05/08/2023   DEXA SCAN  Completed   Hepatitis C Screening  Completed   HPV VACCINES  Aged Out    Health Maintenance  Health Maintenance Due  Topic Date Due   COVID-19 Vaccine (1) Never done   Zoster Vaccines- Shingrix (1 of 2) Never done   Pneumonia Vaccine 76 Years old (1 - PCV) Never done   TETANUS/TDAP  05/07/2021    Colorectal cancer screening: Type of screening:  Colonoscopy. Completed 05/07/2013. Repeat every 10 years  Mammogram status: Completed 02/28/2021. Repeat every year  DEXA Scan: 07/16/2018  Lung Cancer Screening: (Low Dose CT Chest recommended if Age 76-80years, 30 pack-year currently smoking OR have quit w/in 15years.) does not qualify.   Lung Cancer Screening Referral: does not qualify   Additional Screening:  Hepatitis C Screening: does qualify; Completed 11/01/2020  Vision Screening: Recommended annual ophthalmology exams for early detection of glaucoma and other disorders of the eye. Is the patient up to date with their annual eye exam?  Yes  Who is the provider or what is the name of the office in which the patient attends [facility] exams[facility] If pt is not established with a provider, would they like to be referred to a provider to establish care? Yes .   Dental Screening: Recommended annual dental exams for proper oral hygiene  Community Resource Referral / Chronic Care Management: CRR required this visit?  No   CCM required this visit?  No      Plan:     I have personally reviewed and noted the following in the patient's chart:   Medical and social history Use of alcohol, tobacco or illicit drugs  Current medications and supplements including opioid prescriptions.  Functional ability and status Nutritional status Physical activity Advanced directives List of other physicians Hospitalizations, surgeries, and ER visits in previous 12 months Vitals Screenings to include cognitive, depression, and falls Referrals and appointments  In addition, I have reviewed and discussed with patient certain preventive protocols, quality metrics, and best practice recommendations. A written personalized care plan for preventive services as well as general preventive health recommendations were provided to patient.     Wilson Singer, [place]   11/02/2021   Nurse Notes:    Ms. Leinbach , Thank you for taking  time to come for your Medicare Wellness Visit. I appreciate your ongoing commitment to your health goals. Please review the following plan we discussed and let me know if I can assist you in the future.   These are the goals we discussed:  Goals       DIET - INCREASE WATER INTAKE      Recommend drinking at least 5-6 glasses of water a day      Exercise 150 min/wk Moderate Activity      Patient Stated      05/31/2020,get a regular exercise routine and cut back on ice cream      RNCM: Pt-"I want to know how to lowermy cholestorl without using statins" (pt-stated)      CARE PLAN ENTRY (see longtitudinal plan of care for additional care plan information)  Current Barriers:  Chronic Disease Management support, education, and care coordination needs related to HLD and Atherosclerosis  Clinical Goal(s) related to HLD and Atherosclerosis :  Over the next 120 days, patient will:  Work with the care management team to address educational, disease management, and care coordination needs  Begin or continue self health monitoring activities as directed today  adhere to a Heart Healthy Diet Call provider office for new or worsened signs and symptoms New or worsened symptom related to HLD and Atherosclerosis   Call care management team with questions or concerns Verbalize basic understanding of patient centered plan of care established today  Interventions related to HLD and Atherosclerosis  :  Evaluation of current treatment plans and patient's adherence to plan as established by provider Assessed patient understanding of disease states Assessed patient's education and care coordination needs Provided disease specific education to patient- provided the patient with low cholesterol food options. The patient does not wish to take statins and wants natural ways to reduce cholesterol.  Education about the My Chart functionality for education and viewing reports. Also the EMMI program to send information  to the patient by. Will send information on lowering cholesterol via [facility]  Collaborated with appropriate clinical care team members regarding patient needs  Patient Self Care Activities related to HLD and Athersclerosis :  Patient is unable to independently self-manage chronic health conditions  Initial goal documentation         This is a list of the screening recommended for you and due dates:  Health Maintenance  Topic Date Due   COVID-19 Vaccine (1) Never done   Zoster (Shingles) Vaccine (1 of 2) Never done   Pneumonia Vaccine (1 - PCV) Never done   Tetanus Vaccine  05/07/2021   Flu Shot  12/05/2021   Colon Cancer Screening  05/08/2023  DEXA scan (bone density measurement)  Completed   Hepatitis C Screening: USPSTF Recommendation to screen - Ages 46-79 yo.  Completed   HPV Vaccine  Aged Out

## 2021-11-02 NOTE — Progress Notes (Signed)
Subjective:    Patient ID: Michelle Fuentes, female    DOB: 1946-04-20, 76 y.o.   MRN: [id number]  HPI  Patient presents to clinic today for her annual exam.  Flu: 03/2019 Tetanus: Unsure COVID: Never Pneumovax: Unsure Prevnar: Unsure Shingrix: Never Pap smear: No longer screening Mammogram: 02/2021 Bone density: 07/2018 Colon screening: 2016, [facility] Vision screening: annually Dentist: biannually  Diet: She does eat meat. She consumes fruits and veggies. She does eat some fried foods. She drinks mostly water and coffee. Exercise: Walking  Review of Systems     Past Medical History:  Diagnosis Date   GERD (gastroesophageal reflux disease)    History of TIA (transient ischemic attack)    Hypothyroidism    Iron deficiency anemia    Meniere disease    Mixed hyperlipidemia    Osteopenia    Seasonal allergies     Current Outpatient Medications  Medication Sig Dispense Refill   bacitracin 500 UNIT/GM ointment Apply 1 application topically 2 (two) times daily. 15 g 0   bacitracin-polymyxin b (POLYSPORIN) ophthalmic ointment Place 1 application into the right eye every 12 (twelve) hours. apply to eye every 12 hours while awake 3.5 g 0   Cholecalciferol 1000 UNITS capsule Take 1,000 Units by mouth daily.      cimetidine (TAGAMET) 200 MG tablet Take 200 mg by mouth 2 (two) times daily.     diazepam (VALIUM) 2 MG tablet Take 2 mg by mouth every 6 (six) hours as needed.      fluticasone (FLONASE) 50 MCG/ACT nasal spray SPRAY 2 SPRAYS INTO EACH NOSTRIL EVERY DAY 48 g 3   levothyroxine (SYNTHROID) 75 MCG tablet TAKE 1 TABLET BY MOUTH EVERY DAY 90 tablet 0   loratadine (CLARITIN) 10 MG tablet Take 10 mg daily by mouth.     Lutein 6 MG CAPS Take 10 mg by mouth daily.      meclizine (ANTIVERT) 25 MG tablet Take 25 mg by mouth once as needed.      omeprazole (PRILOSEC) 20 MG capsule Take 20 mg by mouth daily.      predniSONE (DELTASONE) 10 MG tablet Take 10 mg by mouth  once a week.      triamcinolone cream (KENALOG) 0.5 % Apply 1 application topically 2 (two) times daily. To affected areas, for up to 2 weeks. 30 g 1   triamterene-hydrochlorothiazide (DYAZIDE) 37.5-25 MG per capsule Take 1 capsule by mouth daily.      No current facility-administered medications for this visit.    Allergies  Allergen Reactions   Erythromycin Base     Other reaction(s): Other (See Comments) Made her tongue sore    Family History  Problem Relation Age of Onset   Stroke Mother    Hypertension Father    Lung cancer Father    Lymphoma Brother    Melanoma Brother    Breast cancer Maternal Grandmother 80   Ovarian cancer Neg Hx    Colon cancer Neg Hx    Diabetes Neg Hx    Heart disease Neg Hx     Social History   Socioeconomic History   Marital status: Married    Spouse name: Not on file   Number of children: Not on file   Years of education: masters   Highest education level: Master's degree (e.g., MA, MS, MEng, MEd, MSW, MBA)  Occupational History   Occupation: retired  Tobacco Use   Smoking status: Never   Smokeless tobacco: Never  Vaping Use   Vaping Use: Never used  Substance and Sexual Activity   Alcohol use: Yes    Comment: rare ( wine- maybe once a year )   Drug use: No   Sexual activity: Yes    Birth control/protection: Post-menopausal  Other Topics Concern   Not on file  Social History Narrative   Not on file   Social Determinants of Health   Financial Resource Strain: Low Risk  (05/31/2020)   Overall Financial Resource Strain (CARDIA)    Difficulty of Paying Living Expenses: Not hard at all  Food Insecurity: No Food Insecurity (05/31/2020)   Hunger Vital Sign    Worried About Running Out of Food in the Last Year: Never true    Ran Out of Food in the Last Year: Never true  Transportation Needs: No Transportation Needs (05/31/2020)   PRAPARE - Hydrologist (Medical): No    Lack of Transportation  (Non-Medical): No  Physical Activity: Inactive (05/31/2020)   Exercise Vital Sign    Days of Exercise per Week: 0 days    Minutes of Exercise per Session: 0 min  Stress: No Stress Concern Present (05/31/2020)   [place]    Feeling of Stress : Not at all  Social Connections: Moderately Integrated (03/26/2017)   Social Connection and Isolation Panel [NHANES]    Frequency of Communication with Friends and Family: Once a week    Frequency of Social Gatherings with Friends and Family: Once a week    Attends Religious Services: More than 4 times per year    Active Member of [employer] or Organizations: Yes    Attends Music therapist: More than 4 times per year    Marital Status: Married  Human resources officer Violence: Not At Risk (07/09/2019)   Humiliation, Afraid, Rape, and Kick questionnaire    Fear of Current or Ex-Partner: No    Emotionally Abused: No    Physically Abused: No    Sexually Abused: No     Constitutional: Denies fever, malaise, fatigue, headache or abrupt weight changes.  HEENT: Denies eye pain, eye redness, ear pain, ringing in the ears, wax buildup, runny nose, nasal congestion, bloody nose, or sore throat. Respiratory: Denies difficulty breathing, shortness of breath, cough or sputum production.   Cardiovascular: Denies chest pain, chest tightness, palpitations or swelling in the hands or feet.  Gastrointestinal: Denies abdominal pain, bloating, constipation, diarrhea or blood in the stool.  GU: Denies urgency, frequency, pain with urination, burning sensation, blood in urine, odor or discharge. Musculoskeletal: Patient reports intermittent left upper back pain.  Denies decrease in range of motion, difficulty with gait, or joint pain and swelling.  Skin: Denies redness, rashes, lesions or ulcercations.  Neurological: Denies dizziness, difficulty with memory, difficulty with speech or problems with  balance and coordination.  Psych: Denies anxiety, depression, SI/HI.  No other specific complaints in a complete review of systems (except as listed in HPI above).  Objective:   Physical Exam  BP 140/63 (BP Location: Left Arm, Patient Position: Sitting, Cuff Size: Normal)   Pulse 84   Temp 97.9 F (36.6 C) (Oral)   Resp 17   Ht $R'5\' 4"'Gt$  (1.626 m)   Wt 161 lb 9.6 oz (73.3 kg)   SpO2 100%   BMI 27.74 kg/m   Wt Readings from Last 3 Encounters:  10/31/20 162 lb (73.5 kg)  05/31/20 160 lb (72.6 kg)  05/30/20 160 lb  6.4 oz (72.8 kg)    General: Appears her stated age, overweight, in NAD. Skin: Warm, dry and intact.  HEENT: Head: normal shape and size; Eyes: sclera white, no icterus, conjunctiva pink, PERRLA and EOMs intact;  Neck:  Neck supple, trachea midline. No masses, lumps or thyromegaly present.  Cardiovascular: Normal rate and rhythm. S1,S2 noted.  No murmur, rubs or gallops noted. No JVD or BLE edema. No carotid bruits noted. Pulmonary/Chest: Normal effort and positive vesicular breath sounds. No respiratory distress. No wheezes, rales or ronchi noted.  Abdomen: Soft and nontender. Normal bowel sounds.  Musculoskeletal: Strength 5/5 BUE/BLE.  No difficulty with gait.  Neurological: Alert and oriented. Cranial nerves II-XII grossly intact. Coordination normal.  Psychiatric: Mood and affect normal. Behavior is normal. Judgment and thought content normal.    BMET    Component Value Date/Time   NA 138 11/01/2020 0808   NA 141 05/26/2015 0910   K 3.5 11/01/2020 0808   CL 101 11/01/2020 0808   CO2 27 11/01/2020 0808   GLUCOSE 101 (H) 11/01/2020 0808   BUN 14 11/01/2020 0808   BUN 19 05/26/2015 0910   CREATININE 0.75 11/01/2020 0808   CALCIUM 9.7 11/01/2020 0808   GFRNONAA 79 11/01/2020 0808   GFRAA 91 11/01/2020 0808    Lipid Panel     Component Value Date/Time   CHOL 243 (H) 11/01/2020 0808   CHOL 224 (H) 05/26/2015 0910   TRIG 99 11/01/2020 0808   HDL 71  11/01/2020 0808   HDL 75 05/26/2015 0910   CHOLHDL 3.4 11/01/2020 0808   LDLCALC 151 (H) 11/01/2020 0808    CBC    Component Value Date/Time   WBC 8.4 11/01/2020 0808   RBC 5.06 11/01/2020 0808   HGB 14.2 11/01/2020 0808   HGB 14.3 05/26/2015 0910   HCT 43.5 11/01/2020 0808   HCT 42.1 05/26/2015 0910   PLT 418 (H) 11/01/2020 0808   PLT 408 (H) 05/26/2015 0910   MCV 86.0 11/01/2020 0808   MCV 86 05/26/2015 0910   MCV 84 06/10/2014 1222   MCH 28.1 11/01/2020 0808   MCHC 32.6 11/01/2020 0808   RDW 13.3 11/01/2020 0808   RDW 13.3 05/26/2015 0910   RDW 13.2 06/10/2014 1222   LYMPHSABS 1,628 10/22/2019 0816   LYMPHSABS 1.4 05/26/2015 0910   EOSABS 248 10/22/2019 0816   EOSABS 0.3 05/26/2015 0910   BASOSABS 53 10/22/2019 0816   BASOSABS 0.0 05/26/2015 0910    Hgb A1C No results found for: "HGBA1C"         Assessment & Plan:   Preventative Health Maintenance:  Encouraged her to get a flu shot in the fall She declines tetanus for financial reasons, advised her if she gets bit or cut to get this done She declines COVID-vaccine She declines Pneumovax and Prevnar Discussed Shingrix vaccine, she will check coverage with her insurance company and get this done at the pharmacy if she would like to have it done She no longer wants to screen for cervical cancer Mammogram and bone density ordered-she will call to schedule She no longer wants to screen for colon cancer Encouraged her to consume a balanced diet and exercise regimen Advised her to see an eye doctor and dentist annually We will check CBC, c-Met, TSH, Free T4 and lipid profile today  RTC in 6 months, follow-up chronic conditions Webb Silversmith, NP

## 2021-11-02 NOTE — Patient Instructions (Signed)

## 2021-11-02 NOTE — Assessment & Plan Note (Signed)
Encourage diet and exercise for weight loss 

## 2021-11-06 ENCOUNTER — Other Ambulatory Visit: Payer: Medicare HMO

## 2021-11-06 DIAGNOSIS — E039 Hypothyroidism, unspecified: Secondary | ICD-10-CM | POA: Diagnosis not present

## 2021-11-06 DIAGNOSIS — Z0001 Encounter for general adult medical examination with abnormal findings: Secondary | ICD-10-CM | POA: Diagnosis not present

## 2021-11-07 LAB — COMPLETE METABOLIC PANEL WITH GFR
AG Ratio: 2 (calc) (ref 1.0–2.5)
ALT: 11 U/L (ref 6–29)
AST: 11 U/L (ref 10–35)
Albumin: 4.3 g/dL (ref 3.6–5.1)
Alkaline phosphatase (APISO): 59 U/L (ref 37–153)
BUN: 20 mg/dL (ref 7–25)
CO2: 28 mmol/L (ref 20–32)
Calcium: 9.7 mg/dL (ref 8.6–10.4)
Chloride: 101 mmol/L (ref 98–110)
Creat: 0.78 mg/dL (ref 0.60–1.00)
Globulin: 2.1 g/dL (calc) (ref 1.9–3.7)
Glucose, Bld: 102 mg/dL (ref 65–139)
Potassium: 3.8 mmol/L (ref 3.5–5.3)
Sodium: 138 mmol/L (ref 135–146)
Total Bilirubin: 0.4 mg/dL (ref 0.2–1.2)
Total Protein: 6.4 g/dL (ref 6.1–8.1)
eGFR: 79 mL/min/{1.73_m2} (ref >=60)

## 2021-11-07 LAB — LIPID PANEL
Cholesterol: 256 mg/dL — ABNORMAL HIGH (ref <200)
HDL: 68 mg/dL (ref >=50)
LDL Cholesterol (Calc): 167 mg/dL (calc) — ABNORMAL HIGH
Non-HDL Cholesterol (Calc): 188 mg/dL (calc) — ABNORMAL HIGH (ref <130)
Total CHOL/HDL Ratio: 3.8 (calc) (ref <5.0)
Triglycerides: 98 mg/dL (ref <150)

## 2021-11-07 LAB — CBC
HCT: 42.1 % (ref 35.0–45.0)
Hemoglobin: 14 g/dL (ref 11.7–15.5)
MCH: 28.9 pg (ref 27.0–33.0)
MCHC: 33.3 g/dL (ref 32.0–36.0)
MCV: 87 fL (ref 80.0–100.0)
MPV: 10.7 fL (ref 7.5–12.5)
Platelets: 399 10*3/uL (ref 140–400)
RBC: 4.84 10*6/uL (ref 3.80–5.10)
RDW: 12.7 % (ref 11.0–15.0)
WBC: 8.5 10*3/uL (ref 3.8–10.8)

## 2021-11-07 LAB — TSH+FREE T4: TSH W/REFLEX TO FT4: 0.76 mIU/L (ref 0.40–4.50)

## 2022-01-14 ENCOUNTER — Other Ambulatory Visit: Payer: Self-pay | Admitting: Internal Medicine

## 2022-01-14 DIAGNOSIS — E039 Hypothyroidism, unspecified: Secondary | ICD-10-CM

## 2022-01-16 NOTE — Telephone Encounter (Signed)
Requested medication (s) are due for refill today: Yes  Requested medication (s) are on the active medication list: Yes  Last refill:  10/18/21  Future visit scheduled: No  Notes to clinic:  Unable to refill per protocol due to failed labs, no updated results, last TSH 10/2020     Requested Prescriptions  Pending Prescriptions Disp Refills   levothyroxine (SYNTHROID) 75 MCG tablet [Pharmacy Med Name: LEVOTHYROXINE 75 MCG TABLET] 90 tablet 0    Sig: TAKE 1 [place]     Endocrinology:  Hypothyroid Agents Passed - 01/14/2022  9:23 AM      Passed - TSH in normal range and within 360 days    TSH  Date Value Ref Range Status  11/01/2020 1.74 0.40 - 4.50 mIU/L Final         Passed - Valid encounter within last 12 months    Recent Outpatient Visits           2 months ago Encounter for general adult medical examination with abnormal findings   [facility], Coralie Keens, NP   1 year ago Encounter for general adult medical examination with abnormal findings   [facility] [place], Coralie Keens, NP   1 year ago Hordeolum externum of right upper eyelid   [facility], Lupita Raider, FNP   1 year ago Adult hypothyroidism   [place], [place]   2 years ago Adult hypothyroidism   [facility], Lupita Raider, [place]

## 2022-02-21 DIAGNOSIS — H903 Sensorineural hearing loss, bilateral: Secondary | ICD-10-CM | POA: Diagnosis not present

## 2022-02-21 DIAGNOSIS — J300 Vasomotor rhinitis: Secondary | ICD-10-CM | POA: Diagnosis not present

## 2022-02-21 DIAGNOSIS — H6123 Impacted cerumen, bilateral: Secondary | ICD-10-CM | POA: Diagnosis not present

## 2022-02-21 DIAGNOSIS — H8103 Meniere's disease, bilateral: Secondary | ICD-10-CM | POA: Diagnosis not present

## 2022-03-27 DIAGNOSIS — H18511 Endothelial corneal dystrophy, right eye: Secondary | ICD-10-CM | POA: Diagnosis not present

## 2022-04-18 ENCOUNTER — Telehealth: Payer: Self-pay | Admitting: Internal Medicine

## 2022-04-18 DIAGNOSIS — E039 Hypothyroidism, unspecified: Secondary | ICD-10-CM

## 2022-04-18 NOTE — Telephone Encounter (Signed)
Requested medication (s) are due for refill today: yes  Requested medication (s) are on the active medication list: yes  Last refill:  01/16/22 #90/0  Future visit scheduled: no  Notes to clinic:  Unable to refill per protocol due to failed labs, no updated results.     Requested Prescriptions  Pending Prescriptions Disp Refills   levothyroxine (SYNTHROID) 75 MCG tablet [Pharmacy Med Name: LEVOTHYROXINE 75 MCG TABLET] 90 tablet 0    Sig: TAKE 1 TABLET BY MOUTH EVERY DAY     Endocrinology:  Hypothyroid Agents Passed - 04/18/2022 12:41 AM      Passed - TSH in normal range and within 360 days    TSH  Date Value Ref Range Status  11/01/2020 1.74 0.40 - 4.50 mIU/L Final         Passed - Valid encounter within last 12 months    Recent Outpatient Visits           5 months ago Encounter for general adult medical examination with abnormal findings   [facility] [place], Coralie Keens, NP   1 year ago Encounter for general adult medical examination with abnormal findings   [facility] [place], Coralie Keens, NP   1 year ago Hordeolum externum of right upper eyelid   [place], FNP   2 years ago Adult hypothyroidism   [place], [place]   2 years ago Adult hypothyroidism   [facility], Lupita Raider, [place]

## 2022-04-24 NOTE — Telephone Encounter (Addendum)
Pt called in to follow up on her medication refill. Pt scheduled an appointment for 12/22. Pt is requesting refill before her appointment if possible.    Please advise.

## 2022-04-25 MED ORDER — LEVOTHYROXINE SODIUM 75 MCG PO TABS: 75.0000 ug | ORAL_TABLET | Freq: Every day | ORAL | 0 refills | Status: DC

## 2022-04-25 NOTE — Addendum Note (Signed)
Addended by: Ashley Royalty E on: 04/25/2022 10:22 AM   Modules accepted: Orders

## 2022-04-25 NOTE — Telephone Encounter (Signed)
RX sent to her pharmacy.  Thanks,   -Mickel Baas

## 2022-04-27 ENCOUNTER — Ambulatory Visit (INDEPENDENT_AMBULATORY_CARE_PROVIDER_SITE_OTHER): Payer: Medicare HMO | Admitting: Internal Medicine

## 2022-04-27 ENCOUNTER — Encounter: Payer: Self-pay | Admitting: Internal Medicine

## 2022-04-27 VITALS — BP 134/72 | HR 74 | Temp 96.6°F | Wt 161.0 lb

## 2022-04-27 DIAGNOSIS — E039 Hypothyroidism, unspecified: Secondary | ICD-10-CM | POA: Diagnosis not present

## 2022-04-27 DIAGNOSIS — E785 Hyperlipidemia, unspecified: Secondary | ICD-10-CM

## 2022-04-27 DIAGNOSIS — M791 Myalgia, unspecified site: Secondary | ICD-10-CM | POA: Diagnosis not present

## 2022-04-27 DIAGNOSIS — K219 Gastro-esophageal reflux disease without esophagitis: Secondary | ICD-10-CM | POA: Diagnosis not present

## 2022-04-27 DIAGNOSIS — Z6827 Body mass index (BMI) 27.0-27.9, adult: Secondary | ICD-10-CM

## 2022-04-27 DIAGNOSIS — R739 Hyperglycemia, unspecified: Secondary | ICD-10-CM

## 2022-04-27 DIAGNOSIS — R7309 Other abnormal glucose: Secondary | ICD-10-CM | POA: Diagnosis not present

## 2022-04-27 DIAGNOSIS — I6523 Occlusion and stenosis of bilateral carotid arteries: Secondary | ICD-10-CM

## 2022-04-27 DIAGNOSIS — M8588 Other specified disorders of bone density and structure, other site: Secondary | ICD-10-CM | POA: Diagnosis not present

## 2022-04-27 DIAGNOSIS — E663 Overweight: Secondary | ICD-10-CM | POA: Diagnosis not present

## 2022-04-27 DIAGNOSIS — H8103 Meniere's disease, bilateral: Secondary | ICD-10-CM | POA: Diagnosis not present

## 2022-04-27 DIAGNOSIS — Z8673 Personal history of transient ischemic attack (TIA), and cerebral infarction without residual deficits: Secondary | ICD-10-CM | POA: Diagnosis not present

## 2022-04-27 DIAGNOSIS — T466X5A Adverse effect of antihyperlipidemic and antiarteriosclerotic drugs, initial encounter: Secondary | ICD-10-CM

## 2022-04-27 MED ORDER — ASPIRIN 81 MG PO TBEC: 81.0000 mg | DELAYED_RELEASE_TABLET | Freq: Every day | ORAL | 12 refills | Status: DC

## 2022-04-27 NOTE — Assessment & Plan Note (Signed)
C-Met and lipid profile today Encouraged her consume a low-fat diet Discussed possibility of adding a statin 3 times a week, she would be agreeable to this Advised her to take aspirin OTC

## 2022-04-27 NOTE — Assessment & Plan Note (Signed)
C-Met and lipid profile today Encourage her to consume low-fat diet

## 2022-04-27 NOTE — Assessment & Plan Note (Signed)
Encourage diet and exercise for weight loss 

## 2022-04-27 NOTE — Assessment & Plan Note (Signed)
Continue calcium and vitamin D Encourage daily weightbearing exercise 

## 2022-04-27 NOTE — Assessment & Plan Note (Signed)
C-Met and lipid profile today Encouraged her to consume low-fat diet We will have her start baby aspirin She is agreeable to starting statin 3 times weekly

## 2022-04-27 NOTE — Assessment & Plan Note (Signed)
Continue prednisone and triamterene HCT Continue Valium and meclizine as needed

## 2022-04-27 NOTE — Assessment & Plan Note (Signed)
Encourage weight loss as this can help reduce reflux symptoms Continue omeprazole and Tagamet

## 2022-04-27 NOTE — Progress Notes (Signed)
Subjective:    Patient ID: Michelle Fuentes, female    DOB: June 06, 1945, 75 y.o.   MRN: [id number]  HPI  Patient presents to clinic today for 51-monthfollow-up of chronic conditions.  GERD: She is not sure what triggers this.  She denies breakthrough on Omeprazole and Tagamet.  There is no upper GI on file.  Hypothyroidism: She denies any issues on her current dose of Levothyroxine.  She does not follow with endocrinology.  Mnire's Disease: Managed with Prednisone and Triamterene HCT.  She takes Valium and Meclizine as needed.  She no longer follows with ENT.  HLD with Carotid Atherosclerosis, History of TIA: Her last LDL was 167, triglycerides 98, 10/2021.  She refuses to take statins.  She tries to consume low-fat diet.  Osteopenia: She is taking Calcium and Vitamin D OTC.  She does not get weightbearing exercise.  Bone density from 03/2019 reviewed.   Review of Systems     Past Medical History:  Diagnosis Date   GERD (gastroesophageal reflux disease)    History of TIA (transient ischemic attack)    Hypothyroidism    Iron deficiency anemia    Meniere disease    Mixed hyperlipidemia    Osteopenia    Seasonal allergies     Current Outpatient Medications  Medication Sig Dispense Refill   bacitracin 500 UNIT/GM ointment Apply 1 application topically 2 (two) times daily. 15 g 0   bacitracin-polymyxin b (POLYSPORIN) ophthalmic ointment Place 1 application into the right eye every 12 (twelve) hours. apply to eye every 12 hours while awake 3.5 g 0   Cholecalciferol 1000 UNITS capsule Take 1,000 Units by mouth daily.      cimetidine (TAGAMET) 200 MG tablet Take 200 mg by mouth 2 (two) times daily.     diazepam (VALIUM) 2 MG tablet Take 2 mg by mouth every 6 (six) hours as needed.      fluticasone (FLONASE) 50 MCG/ACT nasal spray SPRAY 2 SPRAYS INTO EACH NOSTRIL EVERY DAY 48 g 3   levothyroxine (SYNTHROID) 75 MCG tablet Take 1 tablet (75 mcg total) by mouth daily. 30 tablet 0    loratadine (CLARITIN) 10 MG tablet Take 10 mg daily by mouth.     Lutein 6 MG CAPS Take 10 mg by mouth daily.      meclizine (ANTIVERT) 25 MG tablet Take 25 mg by mouth once as needed.      omeprazole (PRILOSEC) 20 MG capsule Take 20 mg by mouth daily.      predniSONE (DELTASONE) 10 MG tablet Take 10 mg by mouth once a week.      triamcinolone cream (KENALOG) 0.5 % Apply 1 application topically 2 (two) times daily. To affected areas, for up to 2 weeks. (Patient not taking: Reported on 11/02/2021) 30 g 1   triamterene-hydrochlorothiazide (DYAZIDE) 37.5-25 MG per capsule Take 1 capsule by mouth daily.      No current facility-administered medications for this visit.    Allergies  Allergen Reactions   Erythromycin Base     Other reaction(s): Other (See Comments) Made her tongue sore    Family History  Problem Relation Age of Onset   Stroke Mother    Hypertension Father    Lung cancer Father    Lymphoma Brother    Melanoma Brother    Breast cancer Maternal Grandmother 80   Ovarian cancer Neg Hx    Colon cancer Neg Hx    Diabetes Neg Hx    Heart disease Neg Hx  Social History   Socioeconomic History   Marital status: Married    Spouse name: Laurant Knopf   Number of children: 2   Years of education: masters   Highest education level: Master's degree (e.g., MA, MS, MEng, MEd, MSW, MBA)  Occupational History   Occupation: retired  Tobacco Use   Smoking status: Never   Smokeless tobacco: Never  Vaping Use   Vaping Use: Never used  Substance and Sexual Activity   Alcohol use: Yes    Comment: rare ( wine- maybe once a year )   Drug use: No   Sexual activity: Yes    Birth control/protection: Post-menopausal  Other Topics Concern   Not on file  Social History Narrative   Not on file   Social Determinants of Health   Financial Resource Strain: Low Risk  (11/02/2021)   Overall Financial Resource Strain (CARDIA)    Difficulty of Paying Living Expenses: Not hard at  all  Food Insecurity: No Food Insecurity (11/02/2021)   Hunger Vital Sign    Worried About Running Out of Food in the Last Year: Never true    [place] in the Last Year: Never true  Transportation Needs: No Transportation Needs (11/02/2021)   PRAPARE - Hydrologist (Medical): No    Lack of Transportation (Non-Medical): No  Physical Activity: Insufficiently Active (11/02/2021)   Exercise Vital Sign    Days of Exercise per Week: 2 days    Minutes of Exercise per Session: 30 min  Stress: No Stress Concern Present (11/02/2021)   [place]    Feeling of Stress : Only a little  Social Connections: Socially Integrated (11/02/2021)   Social Connection and Isolation Panel [NHANES]    Frequency of Communication with Friends and Family: More than three times a week    Frequency of Social Gatherings with Friends and Family: More than three times a week    Attends Religious Services: More than 4 times per year    Active Member of [employer] or Organizations: Yes    Attends Music therapist: More than 4 times per year    Marital Status: Married  Human resources officer Violence: Not At Risk (11/02/2021)   Humiliation, Afraid, Rape, and Kick questionnaire    Fear of Current or Ex-Partner: No    Emotionally Abused: No    Physically Abused: No    Sexually Abused: No     Constitutional: Denies fever, malaise, fatigue, headache or abrupt weight changes.  HEENT: Denies eye pain, eye redness, ear pain, ringing in the ears, wax buildup, runny nose, nasal congestion, bloody nose, or sore throat. Respiratory: Denies difficulty breathing, shortness of breath, cough or sputum production.   Cardiovascular: Denies chest pain, chest tightness, palpitations or swelling in the hands or feet.  Gastrointestinal: Denies abdominal pain, bloating, constipation, diarrhea or blood in the stool.  GU: Denies urgency,  frequency, pain with urination, burning sensation, blood in urine, odor or discharge. Musculoskeletal: Denies decrease in range of motion, difficulty with gait, muscle pain or joint pain and swelling.  Skin: Denies redness, rashes, lesions or ulcercations.  Neurological: Patient reports intermittent dizziness.  Denies difficulty with memory, difficulty with speech or problems with balance and coordination.  Psych: Denies anxiety, depression, SI/HI.  No other specific complaints in a complete review of systems (except as listed in HPI above).   Objective:   Physical Exam   BP 134/72 (BP Location: Right  Arm, Patient Position: Sitting, Cuff Size: Normal)   Pulse 74   Temp (!) 96.6 F (35.9 C) (Temporal)   Wt 161 lb (73 kg)   SpO2 100%   BMI 27.64 kg/m   Wt Readings from Last 3 Encounters:  11/02/21 161 lb 9.6 oz (73.3 kg)  11/02/21 161 lb 9.6 oz (73.3 kg)  10/31/20 162 lb (73.5 kg)    General: Appears her stated age, overweight, in NAD. Skin: Warm, dry and intact.  HEENT: Head: normal shape and size; Eyes: sclera white, no icterus, conjunctiva pink, PERRLA and EOMs intact;  Neck:  Neck supple, trachea midline. No masses, lumps or thyromegaly present.  Cardiovascular: Normal rate and rhythm. S1,S2 noted.  No murmur, rubs or gallops noted. No JVD or BLE edema. No carotid bruits noted. Pulmonary/Chest: Normal effort and positive vesicular breath sounds. No respiratory distress. No wheezes, rales or ronchi noted.  Abdomen: Normal bowel sounds.  Musculoskeletal:  No difficulty with gait.  Neurological: Alert and oriented. Coordination normal.    BMET    Component Value Date/Time   NA 138 11/06/2021 0924   NA 141 05/26/2015 0910   K 3.8 11/06/2021 0924   CL 101 11/06/2021 0924   CO2 28 11/06/2021 0924   GLUCOSE 102 11/06/2021 0924   BUN 20 11/06/2021 0924   BUN 19 05/26/2015 0910   CREATININE 0.78 11/06/2021 0924   CALCIUM 9.7 11/06/2021 0924   GFRNONAA 79 11/01/2020 0808    GFRAA 91 11/01/2020 0808    Lipid Panel     Component Value Date/Time   CHOL 256 (H) 11/06/2021 0924   CHOL 224 (H) 05/26/2015 0910   TRIG 98 11/06/2021 0924   HDL 68 11/06/2021 0924   HDL 75 05/26/2015 0910   CHOLHDL 3.8 11/06/2021 0924   LDLCALC 167 (H) 11/06/2021 0924    CBC    Component Value Date/Time   WBC 8.5 11/06/2021 0924   RBC 4.84 11/06/2021 0924   HGB 14.0 11/06/2021 0924   HGB 14.3 05/26/2015 0910   HCT 42.1 11/06/2021 0924   HCT 42.1 05/26/2015 0910   PLT 399 11/06/2021 0924   PLT 408 (H) 05/26/2015 0910   MCV 87.0 11/06/2021 0924   MCV 86 05/26/2015 0910   MCV 84 06/10/2014 1222   MCH 28.9 11/06/2021 0924   MCHC 33.3 11/06/2021 0924   RDW 12.7 11/06/2021 0924   RDW 13.3 05/26/2015 0910   RDW 13.2 06/10/2014 1222   LYMPHSABS 1,628 10/22/2019 0816   LYMPHSABS 1.4 05/26/2015 0910   EOSABS 248 10/22/2019 0816   EOSABS 0.3 05/26/2015 0910   BASOSABS 53 10/22/2019 0816   BASOSABS 0.0 05/26/2015 0910    Hgb A1C No results found for: "HGBA1C"         Assessment & Plan:    RTC in 6 months for your annual exam Webb Silversmith, NP

## 2022-04-27 NOTE — Assessment & Plan Note (Signed)
TSH and free T4 today We will adjust levothyroxine if needed based on labs 

## 2022-04-27 NOTE — Patient Instructions (Signed)

## 2022-05-17 ENCOUNTER — Other Ambulatory Visit: Payer: Self-pay | Admitting: Internal Medicine

## 2022-05-17 DIAGNOSIS — E039 Hypothyroidism, unspecified: Secondary | ICD-10-CM

## 2022-05-17 NOTE — Telephone Encounter (Signed)
Requested medication (s) are due for refill today: yes  Requested medication (s) are on the active medication list: yes  Last refill:  04/25/22  Future visit scheduled: no  Notes to clinic:  [place]. DX Code Needed.      Requested Prescriptions  Pending Prescriptions Disp Refills   levothyroxine (SYNTHROID) 75 MCG tablet [Pharmacy Med Name: LEVOTHYROXINE 75 MCG TABLET] 90 tablet 1    Sig: TAKE 1 TABLET BY MOUTH EVERY DAY     Endocrinology:  Hypothyroid Agents Passed - 05/17/2022 12:34 PM      Passed - TSH in normal range and within 360 days    TSH  Date Value Ref Range Status  11/01/2020 1.74 0.40 - 4.50 mIU/L Final         Passed - Valid encounter within last 12 months    Recent Outpatient Visits           2 weeks ago Adult hypothyroidism   [place], NP   6 months ago Encounter for general adult medical examination with abnormal findings   [facility] [place], Coralie Keens, NP   1 year ago Encounter for general adult medical examination with abnormal findings   [facility] [place], Coralie Keens, NP   1 year ago Hordeolum externum of right upper eyelid   [place], FNP   2 years ago Adult hypothyroidism   [facility], Lupita Raider, [place]

## 2022-05-23 DIAGNOSIS — L853 Xerosis cutis: Secondary | ICD-10-CM | POA: Diagnosis not present

## 2022-05-23 DIAGNOSIS — L570 Actinic keratosis: Secondary | ICD-10-CM | POA: Diagnosis not present

## 2022-05-23 DIAGNOSIS — L298 Other pruritus: Secondary | ICD-10-CM | POA: Diagnosis not present

## 2022-05-23 DIAGNOSIS — L821 Other seborrheic keratosis: Secondary | ICD-10-CM | POA: Diagnosis not present

## 2022-08-07 ENCOUNTER — Other Ambulatory Visit: Payer: Self-pay | Admitting: Internal Medicine

## 2022-08-07 DIAGNOSIS — J301 Allergic rhinitis due to pollen: Secondary | ICD-10-CM

## 2022-08-07 NOTE — Telephone Encounter (Signed)
Requested Prescriptions  Pending Prescriptions Disp Refills   fluticasone (FLONASE) 50 MCG/ACT nasal spray [Pharmacy Med Name: FLUTICASONE PROP 50 MCG SPRAY] 48 mL 1    Sig: SPRAY 2 SPRAYS INTO EACH NOSTRIL EVERY DAY     Ear, Nose, and Throat: Nasal Preparations - Corticosteroids Passed - 08/07/2022 11:28 AM      Passed - Valid encounter within last 12 months    Recent Outpatient Visits           3 months ago Adult hypothyroidism   [facility] [place], Coralie Keens, NP   9 months ago Encounter for general adult medical examination with abnormal findings   [facility] [place], Coralie Keens, NP   1 year ago Encounter for general adult medical examination with abnormal findings   [facility] [place], Coralie Keens, NP   2 years ago Hordeolum externum of right upper eyelid   [facility] [place], Lupita Raider, FNP   2 years ago Adult hypothyroidism   [facility] [place], Lupita Raider, [place]

## 2022-08-16 DIAGNOSIS — L821 Other seborrheic keratosis: Secondary | ICD-10-CM | POA: Diagnosis not present

## 2022-08-16 DIAGNOSIS — L570 Actinic keratosis: Secondary | ICD-10-CM | POA: Diagnosis not present

## 2022-08-16 DIAGNOSIS — L280 Lichen simplex chronicus: Secondary | ICD-10-CM | POA: Diagnosis not present

## 2022-08-16 DIAGNOSIS — L298 Other pruritus: Secondary | ICD-10-CM | POA: Diagnosis not present

## 2022-10-29 DIAGNOSIS — I781 Nevus, non-neoplastic: Secondary | ICD-10-CM | POA: Diagnosis not present

## 2022-10-29 DIAGNOSIS — L298 Other pruritus: Secondary | ICD-10-CM | POA: Diagnosis not present

## 2022-10-29 DIAGNOSIS — L821 Other seborrheic keratosis: Secondary | ICD-10-CM | POA: Diagnosis not present

## 2022-11-11 ENCOUNTER — Other Ambulatory Visit: Payer: Self-pay | Admitting: Internal Medicine

## 2022-11-11 DIAGNOSIS — E039 Hypothyroidism, unspecified: Secondary | ICD-10-CM

## 2022-11-12 NOTE — Telephone Encounter (Signed)
Requested medication (s) are due for refill today:   Yes  Requested medication (s) are on the active medication list:   Yes  Future visit scheduled:   No   Last ordered: 05/17/2022 #90, 1 refill  Returned because TSH is due.      Requested Prescriptions  Pending Prescriptions Disp Refills   levothyroxine (SYNTHROID) 75 MCG tablet [Pharmacy Med Name: LEVOTHYROXINE 75 MCG TABLET] 90 tablet 1    Sig: TAKE 1 TABLET BY MOUTH EVERY DAY     Endocrinology:  Hypothyroid Agents Failed - 11/11/2022  8:42 AM      Failed - TSH in normal range and within 360 days    TSH  Date Value Ref Range Status  11/01/2020 1.74 0.40 - 4.50 mIU/L Final         Passed - Valid encounter within last 12 months    Recent Outpatient Visits           6 months ago Adult hypothyroidism   [facility] [facility] [place], Salvadore Oxford, NP   1 year ago Encounter for general adult medical examination with abnormal findings   [place] [facility] [place], Salvadore Oxford, NP   2 years ago Encounter for general adult medical examination with abnormal findings   [facility] [place], Salvadore Oxford, NP   2 years ago Hordeolum externum of right upper eyelid   [place] [facility], Jodelle Gross, FNP   2 years ago Adult hypothyroidism    [facility], Jodelle Gross, [place]

## 2022-11-16 ENCOUNTER — Encounter: Payer: Self-pay | Admitting: Internal Medicine

## 2022-11-16 ENCOUNTER — Ambulatory Visit (INDEPENDENT_AMBULATORY_CARE_PROVIDER_SITE_OTHER): Payer: Medicare HMO | Admitting: Internal Medicine

## 2022-11-16 VITALS — BP 136/74 | HR 79 | Temp 96.8°F | Wt 166.0 lb

## 2022-11-16 DIAGNOSIS — Z780 Asymptomatic menopausal state: Secondary | ICD-10-CM

## 2022-11-16 DIAGNOSIS — Z1231 Encounter for screening mammogram for malignant neoplasm of breast: Secondary | ICD-10-CM | POA: Diagnosis not present

## 2022-11-16 DIAGNOSIS — Z6828 Body mass index (BMI) 28.0-28.9, adult: Secondary | ICD-10-CM | POA: Diagnosis not present

## 2022-11-16 DIAGNOSIS — E663 Overweight: Secondary | ICD-10-CM

## 2022-11-16 DIAGNOSIS — E039 Hypothyroidism, unspecified: Secondary | ICD-10-CM

## 2022-11-16 DIAGNOSIS — R739 Hyperglycemia, unspecified: Secondary | ICD-10-CM

## 2022-11-16 DIAGNOSIS — E785 Hyperlipidemia, unspecified: Secondary | ICD-10-CM | POA: Diagnosis not present

## 2022-11-16 DIAGNOSIS — R7309 Other abnormal glucose: Secondary | ICD-10-CM

## 2022-11-16 DIAGNOSIS — Z0001 Encounter for general adult medical examination with abnormal findings: Secondary | ICD-10-CM

## 2022-11-16 NOTE — Assessment & Plan Note (Signed)
Encourage diet and exercise for weight loss 

## 2022-11-16 NOTE — Patient Instructions (Signed)
Health Maintenance for Postmenopausal Women Menopause is a normal process in which your ability to get pregnant comes to an end. This process happens slowly over many months or years, usually between the ages of 48 and 55. Menopause is complete when you have missed your menstrual period for 12 months. It is important to talk with your health care provider about some of the most common conditions that affect women after menopause (postmenopausal women). These include heart disease, cancer, and bone loss (osteoporosis). Adopting a healthy lifestyle and getting preventive care can help to promote your health and wellness. The actions you take can also lower your chances of developing some of these common conditions. What are the signs and symptoms of menopause? During menopause, you may have the following symptoms: Hot flashes. These can be moderate or severe. Night sweats. Decrease in sex drive. Mood swings. Headaches. Tiredness (fatigue). Irritability. Memory problems. Problems falling asleep or staying asleep. Talk with your health care provider about treatment options for your symptoms. Do I need hormone replacement therapy? Hormone replacement therapy is effective in treating symptoms that are caused by menopause, such as hot flashes and night sweats. Hormone replacement carries certain risks, especially as you become older. If you are thinking about using estrogen or estrogen with progestin, discuss the benefits and risks with your health care provider. How can I reduce my risk for heart disease and stroke? The risk of heart disease, heart attack, and stroke increases as you age. One of the causes may be a change in the body's hormones during menopause. This can affect how your body uses dietary fats, triglycerides, and cholesterol. Heart attack and stroke are medical emergencies. There are many things that you can do to help prevent heart disease and stroke. Watch your blood pressure High  blood pressure causes heart disease and increases the risk of stroke. This is more likely to develop in people who have high blood pressure readings or are overweight. Have your blood pressure checked: Every 3-5 years if you are 18-39 years of age. Every year if you are 40 years old or older. Eat a healthy diet  Eat a diet that includes plenty of vegetables, fruits, low-fat dairy products, and lean protein. Do not eat a lot of foods that are high in solid fats, added sugars, or sodium. Get regular exercise Get regular exercise. This is one of the most important things you can do for your health. Most adults should: Try to exercise for at least 150 minutes each week. The exercise should increase your heart rate and make you sweat (moderate-intensity exercise). Try to do strengthening exercises at least twice each week. Do these in addition to the moderate-intensity exercise. Spend less time sitting. Even light physical activity can be beneficial. Other tips Work with your health care provider to achieve or maintain a healthy weight. Do not use any products that contain nicotine or tobacco. These products include cigarettes, chewing tobacco, and vaping devices, such as e-cigarettes. If you need help quitting, ask your health care provider. Know your numbers. Ask your health care provider to check your cholesterol and your blood sugar (glucose). Continue to have your blood tested as directed by your health care provider. Do I need screening for cancer? Depending on your health history and family history, you may need to have cancer screenings at different stages of your life. This may include screening for: Breast cancer. Cervical cancer. Lung cancer. Colorectal cancer. What is my risk for osteoporosis? After menopause, you may be   at increased risk for osteoporosis. Osteoporosis is a condition in which bone destruction happens more quickly than new bone creation. To help prevent osteoporosis or  the bone fractures that can happen because of osteoporosis, you may take the following actions: If you are 19-50 years old, get at least 1,000 mg of calcium and at least 600 international units (IU) of vitamin D per day. If you are older than age 50 but younger than age 70, get at least 1,200 mg of calcium and at least 600 international units (IU) of vitamin D per day. If you are older than age 70, get at least 1,200 mg of calcium and at least 800 international units (IU) of vitamin D per day. Smoking and drinking excessive alcohol increase the risk of osteoporosis. Eat foods that are rich in calcium and vitamin D, and do weight-bearing exercises several times each week as directed by your health care provider. How does menopause affect my mental health? Depression may occur at any age, but it is more common as you become older. Common symptoms of depression include: Feeling depressed. Changes in sleep patterns. Changes in appetite or eating patterns. Feeling an overall lack of motivation or enjoyment of activities that you previously enjoyed. Frequent crying spells. Talk with your health care provider if you think that you are experiencing any of these symptoms. General instructions See your health care provider for regular wellness exams and vaccines. This may include: Scheduling regular health, dental, and eye exams. Getting and maintaining your vaccines. These include: Influenza vaccine. Get this vaccine each year before the flu season begins. Pneumonia vaccine. Shingles vaccine. Tetanus, diphtheria, and pertussis (Tdap) booster vaccine. Your health care provider may also recommend other immunizations. Tell your health care provider if you have ever been abused or do not feel safe at home. Summary Menopause is a normal process in which your ability to get pregnant comes to an end. This condition causes hot flashes, night sweats, decreased interest in sex, mood swings, headaches, or lack  of sleep. Treatment for this condition may include hormone replacement therapy. Take actions to keep yourself healthy, including exercising regularly, eating a healthy diet, watching your weight, and checking your blood pressure and blood sugar levels. Get screened for cancer and depression. Make sure that you are up to date with all your vaccines. This information is not intended to replace advice given to you by your health care provider. Make sure you discuss any questions you have with your health care provider. Document Revised: 09/12/2020 Document Reviewed: 09/12/2020 Elsevier Patient Education  2024 Elsevier Inc.  

## 2022-11-16 NOTE — Progress Notes (Signed)
Subjective:    Patient ID: Michelle Fuentes, female    DOB: [birth year], 77 y.o.   MRN: [id number]  HPI  Patient presents to clinic today for her annual exam.    Flu: 03/2019 Tetanus: >10 years ago COVID: Never Pneumovax: Never Prevnar: Never Shingrix: Never Pap smear: No longer screening Mammogram: 02/2021 Bone density: 07/2018 Colon screening: 2016, [facility] Vision screening: annually Dentist: biannually  Diet: She does eat meat. She consumes fruits and veggies. She does eat some fried foods. She drinks mostly coffee, water. Exercise: Walking   Review of Systems  Past Medical History:  Diagnosis Date   GERD (gastroesophageal reflux disease)    History of TIA (transient ischemic attack)    Hypothyroidism    Iron deficiency anemia    Meniere disease    Mixed hyperlipidemia    Osteopenia    Seasonal allergies     Current Outpatient Medications  Medication Sig Dispense Refill   aspirin EC 81 MG tablet Take 1 tablet (81 mg total) by mouth daily. Swallow whole. 30 tablet 12   bacitracin 500 UNIT/GM ointment Apply 1 application topically 2 (two) times daily. 15 g 0   bacitracin-polymyxin b (POLYSPORIN) ophthalmic ointment Place 1 application into the right eye every 12 (twelve) hours. apply to eye every 12 hours while awake 3.5 g 0   Cholecalciferol 1000 UNITS capsule Take 1,000 Units by mouth daily.      cimetidine (TAGAMET) 200 MG tablet Take 200 mg by mouth 2 (two) times daily.     diazepam (VALIUM) 2 MG tablet Take 2 mg by mouth every 6 (six) hours as needed.      fluticasone (FLONASE) 50 MCG/ACT nasal spray SPRAY 2 SPRAYS INTO EACH NOSTRIL EVERY DAY 48 mL 1   levothyroxine (SYNTHROID) 75 MCG tablet TAKE 1 TABLET BY MOUTH EVERY DAY 90 tablet 1   loratadine (CLARITIN) 10 MG tablet Take 10 mg daily by mouth.     Lutein 6 MG CAPS Take 10 mg by mouth daily.      meclizine (ANTIVERT) 25 MG tablet Take 25 mg by mouth once as needed.      omeprazole (PRILOSEC)  20 MG capsule Take 20 mg by mouth daily.      predniSONE (DELTASONE) 10 MG tablet Take 10 mg by mouth once a week.      triamcinolone cream (KENALOG) 0.5 % Apply 1 application topically 2 (two) times daily. To affected areas, for up to 2 weeks. 30 g 1   triamterene-hydrochlorothiazide (DYAZIDE) 37.5-25 MG per capsule Take 1 capsule by mouth daily.      No current facility-administered medications for this visit.    Allergies  Allergen Reactions   Erythromycin Base     Other reaction(s): Other (See Comments) Made her tongue sore    Family History  Problem Relation Age of Onset   Stroke Mother    Hypertension Father    Lung cancer Father    Lymphoma Brother    Melanoma Brother    Breast cancer Maternal Grandmother 101   Ovarian cancer Neg Hx    Colon cancer Neg Hx    Diabetes Neg Hx    Heart disease Neg Hx     Social History   Socioeconomic History   Marital status: Married    Spouse name: Laurant Tiegs   Number of children: 2   Years of education: masters   Highest education level: Master's degree (e.g., MA, MS, MEng, MEd, MSW, MBA)  Occupational  History   Occupation: retired  Tobacco Use   Smoking status: Never   Smokeless tobacco: Never  Vaping Use   Vaping status: Never Used  Substance and Sexual Activity   Alcohol use: Yes    Comment: rare ( wine- maybe once a year )   Drug use: No   Sexual activity: Yes    Birth control/protection: Post-menopausal  Other Topics Concern   Not on file  Social History Narrative   Not on file   Social Determinants of Health   Financial Resource Strain: Low Risk  (11/02/2021)   Overall Financial Resource Strain (CARDIA)    Difficulty of Paying Living Expenses: Not hard at all  Food Insecurity: No Food Insecurity (11/02/2021)   Hunger Vital Sign    Worried About Running Out of Food in the Last Year: Never true    Ran Out of Food in the Last Year: Never true  Transportation Needs: No Transportation Needs (11/02/2021)    PRAPARE - Administrator, Civil Service (Medical): No    Lack of Transportation (Non-Medical): No  Physical Activity: Insufficiently Active (11/02/2021)   Exercise Vital Sign    Days of Exercise per Week: 2 days    Minutes of Exercise per Session: 30 min  Stress: No Stress Concern Present (11/02/2021)   [employer] of Occupational Health - Occupational Stress Questionnaire    Feeling of Stress : Only a little  Social Connections: Socially Integrated (11/02/2021)   Social Connection and Isolation Panel [NHANES]    Frequency of Communication with Friends and Family: More than three times a week    Frequency of Social Gatherings with Friends and Family: More than three times a week    Attends Religious Services: More than 4 times per year    Active Member of [employer] or Organizations: Yes    Attends Engineer, structural: More than 4 times per year    Marital Status: Married  Catering manager Violence: Not At Risk (11/02/2021)   Humiliation, Afraid, Rape, and Kick questionnaire    Fear of Current or Ex-Partner: No    Emotionally Abused: No    Physically Abused: No    Sexually Abused: No     Constitutional: Denies fever, malaise, fatigue, headache or abrupt weight changes.  HEENT: Denies eye pain, eye redness, ear pain, ringing in the ears, wax buildup, runny nose, nasal congestion, bloody nose, or sore throat. Respiratory: Denies difficulty breathing, shortness of breath, cough or sputum production.   Cardiovascular: Denies chest pain, chest tightness, palpitations or swelling in the hands or feet.  Gastrointestinal: Denies abdominal pain, bloating, constipation, diarrhea or blood in the stool.  GU: Denies urgency, frequency, pain with urination, burning sensation, blood in urine, odor or discharge. Musculoskeletal: Denies decrease in range of motion, difficulty with gait, muscle pain or joint pain and swelling.  Skin: Patient reports skin lesion of leg.  Denies  redness, rashes, or ulcercations.  Neurological: Denies dizziness, difficulty with memory, difficulty with speech or problems with balance and coordination.  Psych: Denies anxiety, depression, SI/HI.  No other specific complaints in a complete review of systems (except as listed in HPI above).     Objective:   Physical Exam BP 136/74 (BP Location: Left Arm, Patient Position: Sitting, Cuff Size: Normal)   Pulse 79   Temp (!) 96.8 F (36 C) (Temporal)   Wt 166 lb (75.3 kg)   SpO2 99%   BMI 28.49 kg/m   Wt Readings from Last 3 Encounters:  04/27/22 161 lb (73 kg)  11/02/21 161 lb 9.6 oz (73.3 kg)  11/02/21 161 lb 9.6 oz (73.3 kg)    General: Appears her stated age, overweight, in NAD. Skin: Warm, dry and intact. HEENT: Head: normal shape and size; Eyes: sclera white, no icterus, conjunctiva pink, PERRLA and EOMs intact;  Neck:  Neck supple, trachea midline. No masses, lumps or thyromegaly present.  Cardiovascular: Normal rate and rhythm. S1,S2 noted.  No murmur, rubs or gallops noted. No JVD or BLE edema. No carotid bruits noted. Pulmonary/Chest: Normal effort and positive vesicular breath sounds. No respiratory distress. No wheezes, rales or ronchi noted.  Abdomen: Soft and nontender. Normal bowel sounds.  Musculoskeletal: Strength 5/ BUE/BLE. No difficulty with gait.  Neurological: Alert and oriented. Cranial nerves II-XII grossly intact. Coordination normal.  Psychiatric: Mood and affect normal. Behavior is normal. Judgment and thought content normal.    BMET    Component Value Date/Time   NA 138 11/06/2021 0924   NA 141 05/26/2015 0910   K 3.8 11/06/2021 0924   CL 101 11/06/2021 0924   CO2 28 11/06/2021 0924   GLUCOSE 102 11/06/2021 0924   BUN 20 11/06/2021 0924   BUN 19 05/26/2015 0910   CREATININE 0.78 11/06/2021 0924   CALCIUM 9.7 11/06/2021 0924   GFRNONAA 79 11/01/2020 0808   GFRAA 91 11/01/2020 0808    Lipid Panel     Component Value Date/Time   CHOL 256  (H) 11/06/2021 0924   CHOL 224 (H) 05/26/2015 0910   TRIG 98 11/06/2021 0924   HDL 68 11/06/2021 0924   HDL 75 05/26/2015 0910   CHOLHDL 3.8 11/06/2021 0924   LDLCALC 167 (H) 11/06/2021 0924    CBC    Component Value Date/Time   WBC 8.5 11/06/2021 0924   RBC 4.84 11/06/2021 0924   HGB 14.0 11/06/2021 0924   HGB 14.3 05/26/2015 0910   HCT 42.1 11/06/2021 0924   HCT 42.1 05/26/2015 0910   PLT 399 11/06/2021 0924   PLT 408 (H) 05/26/2015 0910   MCV 87.0 11/06/2021 0924   MCV 86 05/26/2015 0910   MCV 84 06/10/2014 1222   MCH 28.9 11/06/2021 0924   MCHC 33.3 11/06/2021 0924   RDW 12.7 11/06/2021 0924   RDW 13.3 05/26/2015 0910   RDW 13.2 06/10/2014 1222   LYMPHSABS 1,628 10/22/2019 0816   LYMPHSABS 1.4 05/26/2015 0910   EOSABS 248 10/22/2019 0816   EOSABS 0.3 05/26/2015 0910   BASOSABS 53 10/22/2019 0816   BASOSABS 0.0 05/26/2015 0910    Hgb A1C No results found for: "HGBA1C"         Assessment & Plan:   Preventative health maintenance:  Encouraged her to get a flu shot in the fall She declines tetanus for financial reasons, advised if she gets bit or cut to go get this done She declines Pneumovax or Prevnar today Discussed Shingrix vaccine, she will check coverage with her insurance company and schedule visit if she would like to have this done She no longer wants to screen for cervical cancer Mammogram and bone density previously ordered-she just needs to call and schedule this She no longer wants to screen for colon cancer given her age Encouraged her to consume a balanced diet and exercise regimen Advised her to see an eye doctor and dentist annually We will check CBC, c-Met, TSH, free T4, lipid, A1c today  RTC in 6 months, follow-up chronic conditions Nicki Reaper, NP

## 2022-11-19 ENCOUNTER — Encounter: Payer: Medicare HMO | Admitting: Internal Medicine

## 2022-11-19 ENCOUNTER — Other Ambulatory Visit: Payer: Medicare HMO

## 2022-11-19 DIAGNOSIS — E039 Hypothyroidism, unspecified: Secondary | ICD-10-CM

## 2022-11-19 DIAGNOSIS — Z0001 Encounter for general adult medical examination with abnormal findings: Secondary | ICD-10-CM | POA: Diagnosis not present

## 2022-11-19 DIAGNOSIS — R7309 Other abnormal glucose: Secondary | ICD-10-CM

## 2022-11-19 DIAGNOSIS — E785 Hyperlipidemia, unspecified: Secondary | ICD-10-CM

## 2022-11-19 LAB — CBC
Hemoglobin: 13.8 g/dL (ref 11.7–15.5)
MCH: 27.8 pg (ref 27.0–33.0)
MCV: 85.3 fL (ref 80.0–100.0)
RBC: 4.96 10*6/uL (ref 3.80–5.10)
RDW: 13 % (ref 11.0–15.0)

## 2022-11-20 LAB — LIPID PANEL
Cholesterol: 266 mg/dL — ABNORMAL HIGH (ref <200)
HDL: 68 mg/dL (ref >=50)
LDL Cholesterol (Calc): 169 mg/dL (calc) — ABNORMAL HIGH
Non-HDL Cholesterol (Calc): 198 mg/dL (calc) — ABNORMAL HIGH (ref <130)
Total CHOL/HDL Ratio: 3.9 (calc) (ref <5.0)
Triglycerides: 148 mg/dL (ref <150)

## 2022-11-20 LAB — COMPLETE METABOLIC PANEL WITH GFR
AG Ratio: 2 (calc) (ref 1.0–2.5)
ALT: 12 U/L (ref 6–29)
AST: 12 U/L (ref 10–35)
Albumin: 4.6 g/dL (ref 3.6–5.1)
Alkaline phosphatase (APISO): 61 U/L (ref 37–153)
BUN: 18 mg/dL (ref 7–25)
CO2: 28 mmol/L (ref 20–32)
Calcium: 9.9 mg/dL (ref 8.6–10.4)
Chloride: 100 mmol/L (ref 98–110)
Creat: 0.75 mg/dL (ref 0.60–1.00)
Globulin: 2.3 g/dL (calc) (ref 1.9–3.7)
Glucose, Bld: 100 mg/dL — ABNORMAL HIGH (ref 65–99)
Potassium: 4 mmol/L (ref 3.5–5.3)
Sodium: 138 mmol/L (ref 135–146)
Total Bilirubin: 0.6 mg/dL (ref 0.2–1.2)
Total Protein: 6.9 g/dL (ref 6.1–8.1)
eGFR: 82 mL/min/{1.73_m2} (ref >=60)

## 2022-11-20 LAB — CBC
HCT: 42.3 % (ref 35.0–45.0)
MCHC: 32.6 g/dL (ref 32.0–36.0)
MPV: 10.4 fL (ref 7.5–12.5)
Platelets: 374 10*3/uL (ref 140–400)
WBC: 9.2 10*3/uL (ref 3.8–10.8)

## 2022-11-20 LAB — T4, FREE: Free T4: 1.5 ng/dL (ref 0.8–1.8)

## 2022-11-20 LAB — HEMOGLOBIN A1C
Hgb A1c MFr Bld: 5.9 % of total Hgb — ABNORMAL HIGH (ref <5.7)
Mean Plasma Glucose: 123 mg/dL
eAG (mmol/L): 6.8 mmol/L

## 2022-11-20 LAB — TSH: TSH: 1.54 mIU/L (ref 0.40–4.50)

## 2022-11-22 ENCOUNTER — Ambulatory Visit (INDEPENDENT_AMBULATORY_CARE_PROVIDER_SITE_OTHER): Payer: Medicare HMO

## 2022-11-22 VITALS — BP 130/70 | Ht 66.0 in | Wt 168.6 lb

## 2022-11-22 DIAGNOSIS — Z0000 Encounter for general adult medical examination without abnormal findings: Secondary | ICD-10-CM

## 2022-11-22 NOTE — Progress Notes (Signed)
Subjective:   Michelle Fuentes is a 77 y.o. female who presents for Medicare Annual (Subsequent) preventive examination.  Visit Complete: In person   Review of Systems     Cardiac Risk Factors include: advanced age (>42men, >4 women);sedentary lifestyle;dyslipidemia     Objective:    Today's Vitals   11/22/22 1457  BP: 130/70  Weight: 168 lb 9.6 oz (76.5 kg)  Height: 5\' 6"  (1.676 m)   Body mass index is 27.21 kg/m.     11/22/2022    3:09 PM 05/31/2020    1:25 PM 04/13/2020    8:26 AM 03/23/2020    9:52 AM 05/26/2019    1:49 PM 03/26/2017    9:16 AM  Advanced Directives  Does Patient Have a Medical Advance Directive? No Yes Yes Yes Yes Yes  Type of Special educational needs teacher of [place];Living will Healthcare Power of [place];Living will Healthcare Power of [place];Living will Living will;Healthcare Power of [employer] Power of [place];Living will  Does patient want to make changes to medical advance directive?   No - Patient declined     Copy of Healthcare Power of Attorney in Chart?  No - copy requested No - copy requested No - copy requested No - copy requested No - copy requested  Would patient like information on creating a medical advance directive? No - Patient declined         Current Medications (verified) Outpatient Encounter Medications as of 11/22/2022  Medication Sig   bacitracin 500 UNIT/GM ointment Apply 1 application topically 2 (two) times daily.   Cholecalciferol 1000 UNITS capsule Take 1,000 Units by mouth daily.    cimetidine (TAGAMET) 200 MG tablet Take 200 mg by mouth 2 (two) times daily.   diazepam (VALIUM) 2 MG tablet Take 2 mg by mouth every 6 (six) hours as needed.    fluticasone (FLONASE) 50 MCG/ACT nasal spray SPRAY 2 SPRAYS INTO EACH NOSTRIL EVERY DAY   levothyroxine (SYNTHROID) 75 MCG tablet TAKE 1 TABLET BY MOUTH EVERY DAY   loratadine (CLARITIN) 10 MG tablet Take 10 mg daily by mouth.   Lutein 6 MG CAPS Take 10 mg by  mouth daily.    meclizine (ANTIVERT) 25 MG tablet Take 25 mg by mouth once as needed.    omeprazole (PRILOSEC) 20 MG capsule Take 20 mg by mouth daily.    predniSONE (DELTASONE) 10 MG tablet Take 10 mg by mouth once a week.    triamcinolone cream (KENALOG) 0.5 % Apply 1 application topically 2 (two) times daily. To affected areas, for up to 2 weeks.   triamterene-hydrochlorothiazide (DYAZIDE) 37.5-25 MG per capsule Take 1 capsule by mouth daily.    bacitracin-polymyxin b (POLYSPORIN) ophthalmic ointment Place 1 application into the right eye every 12 (twelve) hours. apply to eye every 12 hours while awake (Patient not taking: Reported on 11/22/2022)   [DISCONTINUED] aspirin EC 81 MG tablet Take 1 tablet (81 mg total) by mouth daily. Swallow whole. (Patient not taking: Reported on 11/16/2022)   No facility-administered encounter medications on file as of 11/22/2022.    Allergies (verified) Erythromycin base   History: Past Medical History:  Diagnosis Date   GERD (gastroesophageal reflux disease)    History of TIA (transient ischemic attack)    Hypothyroidism    Iron deficiency anemia    Meniere disease    Mixed hyperlipidemia    Osteopenia    Seasonal allergies    Past Surgical History:  Procedure Laterality Date   CATARACT  EXTRACTION W/PHACO Left 03/23/2020   Procedure: CATARACT EXTRACTION PHACO AND INTRAOCULAR LENS PLACEMENT (IOC) LEFT;  Surgeon: Lockie Mola, MD;  Location[facility];  Service: Ophthalmology;  Laterality: Left;  6.35 1:10.9 9.0%   CATARACT EXTRACTION W/PHACO Right 04/13/2020   Procedure: CATARACT EXTRACTION PHACO AND INTRAOCULAR LENS PLACEMENT (IOC) RIGHT 9.94 01:39.7 10.0%;  Surgeon: Lockie Mola, MD;  Location: [facility];  Service: Ophthalmology;  Laterality: Right;   eye surgery     mole removed     TONSILLECTOMY     Family History  Problem Relation Age of Onset   Stroke Mother    Hypertension Father    Lung cancer  Father    Lymphoma Brother    Melanoma Brother    Breast cancer Maternal Grandmother 79   Ovarian cancer Neg Hx    Colon cancer Neg Hx    Diabetes Neg Hx    Heart disease Neg Hx    Social History   Socioeconomic History   Marital status: Married    Spouse name: Laurant Rostron   Number of children: 2   Years of education: masters   Highest education level: Master's degree (e.g., MA, MS, MEng, MEd, MSW, MBA)  Occupational History   Occupation: retired  Tobacco Use   Smoking status: Never   Smokeless tobacco: Never  Vaping Use   Vaping status: Never Used  Substance and Sexual Activity   Alcohol use: Yes    Comment: rare ( wine- maybe once a year )   Drug use: No   Sexual activity: Yes    Birth control/protection: Post-menopausal  Other Topics Concern   Not on file  Social History Narrative   Not on file   Social Determinants of Health   Financial Resource Strain: Low Risk  (11/22/2022)   Overall Financial Resource Strain (CARDIA)    Difficulty of Paying Living Expenses: Not hard at all  Food Insecurity: No Food Insecurity (11/22/2022)   Hunger Vital Sign    Worried About Running Out of Food in the Last Year: Never true    Ran Out of Food in the Last Year: Never true  Transportation Needs: No Transportation Needs (11/22/2022)   PRAPARE - Administrator, Civil Service (Medical): No    Lack of Transportation (Non-Medical): No  Physical Activity: Inactive (11/22/2022)   Exercise Vital Sign    Days of Exercise per Week: 0 days    Minutes of Exercise per Session: 0 min  Stress: No Stress Concern Present (11/22/2022)   [employer] of Occupational Health - Occupational Stress Questionnaire    Feeling of Stress : Only a little  Social Connections: Moderately Isolated (11/22/2022)   Social Connection and Isolation Panel [NHANES]    Frequency of Communication with Friends and Family: More than three times a week    Frequency of Social Gatherings with Friends  and Family: More than three times a week    Attends Religious Services: Never    Database administrator or Organizations: No    Attends Engineer, structural: Never    Marital Status: Married    Tobacco Counseling Counseling given: Not Answered   Clinical Intake:  Pre-visit preparation completed: Yes  Pain : No/denies pain     Nutritional Risks: None Diabetes: No  How often do you need to have someone help you when you read instructions, pamphlets, or other written materials from your doctor or pharmacy?: 1 - Never  Interpreter Needed?: No  Information entered  by :: Kennedy Bucker, LPN   Activities of Daily Living    11/22/2022    3:10 PM 11/16/2022    1:56 PM  In your present state of health, do you have any difficulty performing the following activities:  Hearing? 1 0  Vision? 0 0  Difficulty concentrating or making decisions? 0 0  Walking or climbing stairs? 0 0  Dressing or bathing? 0 0  Doing errands, shopping? 0 0  Preparing Food and eating ? N   Using the Toilet? N   In the past six months, have you accidently leaked urine? N   Do you have problems with loss of bowel control? N   Managing your Medications? N   Managing your Finances? N   Housekeeping or managing your Housekeeping? N     Patient Care Team: Lorre Munroe, NP as PCP - General (Internal Medicine) Hilaria Ota, MD as Referring Physician (Otolaryngology) Bud Face, MD as Referring Physician (Otolaryngology)  Indicate any recent Medical Services you may have received from other than [facility] providers in the past year (date may be approximate).     Assessment:   This is a routine wellness examination for Naira.  Hearing/Vision screen Hearing Screening - Comments:: No aids Vision Screening - Comments:: Cataract sgy, implant lenses, wears readers- Dr.Brasington  Dietary issues and exercise activities discussed:     Goals Addressed             This Visit's  Progress    DIET - EAT MORE FRUITS AND VEGETABLES         Depression Screen    11/22/2022    3:05 PM 11/16/2022    1:56 PM 04/27/2022   11:17 AM 11/02/2021    9:17 AM 10/31/2020    3:22 PM 05/31/2020    1:26 PM 07/09/2019    3:54 PM  PHQ 2/9 Scores  PHQ - 2 Score 0 0 0 1 0 0 0  PHQ- 9 Score 0    0      Fall Risk    11/22/2022    3:10 PM 11/16/2022    1:56 PM 04/27/2022   11:17 AM 11/02/2021    9:23 AM 10/31/2020    3:18 PM  Fall Risk   Falls in the past year? 0 0 0 0 0  Number falls in past yr: 0   0 0  Injury with Fall? 0 0 0 0 0  Risk for fall due to : No Fall Risks No Fall Risks  No Fall Risks   Follow up Falls prevention discussed;Falls evaluation completed   Falls evaluation completed     MEDICARE RISK AT HOME:  Medicare Risk at Home - 11/22/22 1510     Any stairs in or around the home? Yes    If so, are there any without handrails? No    Home free of loose throw rugs in walkways, pet beds, electrical cords, etc? Yes    Adequate lighting in your home to reduce risk of falls? Yes    Life alert? No    Use of a cane, walker or w/c? No    Grab bars in the bathroom? Yes    Shower chair or bench in shower? Yes    Elevated toilet seat or a handicapped toilet? Yes             TIMED UP AND GO:  Was the test performed?  Yes  Length of time to ambulate 10 feet: 4 sec Gait  steady and fast without use of assistive device    Cognitive Function:        11/22/2022    3:11 PM 11/02/2021    9:29 AM 05/31/2020    1:30 PM  6CIT Screen  What Year? 0 points 0 points 0 points  What month? 0 points 0 points 0 points  What time? 0 points 0 points 0 points  Count back from 20 0 points 0 points 0 points  Months in reverse 0 points 0 points 0 points  Repeat phrase 0 points 0 points 0 points  Total Score 0 points 0 points 0 points    Immunizations Immunization History  Administered Date(s) Administered   Fluad Quad(high Dose 65+) 03/11/2019   Influenza, High Dose Seasonal  PF 04/28/2015, 04/11/2017, 04/28/2018   Influenza,inj,Quad PF,6+ Mos 01/12/2013    TDAP status: Due, Education has been provided regarding the importance of this vaccine. Advised may receive this vaccine at local pharmacy or Health Dept. Aware to provide a copy of the vaccination record if obtained from local pharmacy or Health Dept. Verbalized acceptance and understanding.  Flu Vaccine status: Declined, Education has been provided regarding the importance of this vaccine but patient still declined. Advised may receive this vaccine at local pharmacy or Health Dept. Aware to provide a copy of the vaccination record if obtained from local pharmacy or Health Dept. Verbalized acceptance and understanding.  Pneumococcal vaccine status: Declined,  Education has been provided regarding the importance of this vaccine but patient still declined. Advised may receive this vaccine at local pharmacy or Health Dept. Aware to provide a copy of the vaccination record if obtained from local pharmacy or Health Dept. Verbalized acceptance and understanding.   Covid-19 vaccine status: Declined, Education has been provided regarding the importance of this vaccine but patient still declined. Advised may receive this vaccine at local pharmacy or Health Dept.or vaccine clinic. Aware to provide a copy of the vaccination record if obtained from local pharmacy or Health Dept. Verbalized acceptance and understanding.  Qualifies for Shingles Vaccine? Yes   Zostavax completed No   Shingrix Completed?: No.    Education has been provided regarding the importance of this vaccine. Patient has been advised to call insurance company to determine out of pocket expense if they have not yet received this vaccine. Advised may also receive vaccine at local pharmacy or Health Dept. Verbalized acceptance and understanding.  Screening Tests Health Maintenance  Topic Date Due   DTaP/Tdap/Td (1 - Tdap) Never done   Zoster Vaccines- Shingrix  (1 of 2) Never done   Pneumonia Vaccine 48+ Years old (1 of 1 - PCV) Never done   COVID-19 Vaccine (1 - 2023-24 season) Never done   INFLUENZA VACCINE  12/06/2022   Medicare Annual Wellness (AWV)  11/22/2023   DEXA SCAN  Completed   Hepatitis C Screening  Completed   HPV VACCINES  Aged Out   Colonoscopy  Discontinued    Health Maintenance  Health Maintenance Due  Topic Date Due   DTaP/Tdap/Td (1 - Tdap) Never done   Zoster Vaccines- Shingrix (1 of 2) Never done   Pneumonia Vaccine 63+ Years old (1 of 1 - PCV) Never done   COVID-19 Vaccine (1 - 2023-24 season) Never done    Colorectal cancer screening: No longer required.   Mammogram status: No longer required due to age.- is making an appt. For this year  Bone Density status: Ordered 11/16/22. Pt provided with contact info and advised to call to  schedule appt.  Lung Cancer Screening: (Low Dose CT Chest recommended if Age 73-80 years, 20 pack-year currently smoking OR have quit w/in 15years.) does not qualify.    Additional Screening:  Hepatitis C Screening: does qualify; Completed 11/01/20  Vision Screening: Recommended annual ophthalmology exams for early detection of glaucoma and other disorders of the eye. Is the patient up to date with their annual eye exam?  Yes  Who is the provider or what is the name of the office in which the patient attends annual eye exams? Dr.Brasington If pt is not established with a provider, would they like to be referred to a provider to establish care? No .   Dental Screening: Recommended annual dental exams for proper oral hygiene   Community Resource Referral / Chronic Care Management: CRR required this visit?  No   CCM required this visit?  No     Plan:     I have personally reviewed and noted the following in the patient's chart:   Medical and social history Use of alcohol, tobacco or illicit drugs  Current medications and supplements including opioid prescriptions. Patient is  not currently taking opioid prescriptions. Functional ability and status Nutritional status Physical activity Advanced directives List of other physicians Hospitalizations, surgeries, and ER visits in previous 12 months Vitals Screenings to include cognitive, depression, and falls Referrals and appointments  In addition, I have reviewed and discussed with patient certain preventive protocols, quality metrics, and best practice recommendations. A written personalized care plan for preventive services as well as general preventive health recommendations were provided to patient.     Hal Hope, LPN   1/61/0960   After Visit Summary: (MyChart) Due to this being a telephonic visit, the after visit summary with patients personalized plan was offered to patient via MyChart   Nurse Notes: none

## 2022-11-22 NOTE — Patient Instructions (Signed)
[name] , Thank you for taking time to come for your Medicare Wellness Visit. I appreciate your ongoing commitment to your health goals. Please review the following plan we discussed and let me know if I can assist you in the future.   These are the goals we discussed:  Goals       DIET - EAT MORE FRUITS AND VEGETABLES      DIET - INCREASE WATER INTAKE      Recommend drinking at least 5-6 glasses of water a day      Exercise 150 min/wk Moderate Activity      Patient Stated      05/31/2020,get a regular exercise routine and cut back on ice cream      RNCM: Pt-"I want to know how to lowermy cholestorl without using statins" (pt-stated)      CARE PLAN ENTRY (see longtitudinal plan of care for additional care plan information)  Current Barriers:  Chronic Disease Management support, education, and care coordination needs related to HLD and Atherosclerosis  Clinical Goal(s) related to HLD and Atherosclerosis :  Over the next 120 days, patient will:  Work with the care management team to address educational, disease management, and care coordination needs  Begin or continue self health monitoring activities as directed today  adhere to a Heart Healthy Diet Call provider office for new or worsened signs and symptoms New or worsened symptom related to HLD and Atherosclerosis   Call care management team with questions or concerns Verbalize basic understanding of patient centered plan of care established today  Interventions related to HLD and Atherosclerosis  :  Evaluation of current treatment plans and patient's adherence to plan as established by provider Assessed patient understanding of disease states Assessed patient's education and care coordination needs Provided disease specific education to patient- provided the patient with low cholesterol food options. The patient does not wish to take statins and wants natural ways to reduce cholesterol.  Education about the My Chart  functionality for education and viewing reports. Also the EMMI program to send information to the patient by. Will send information on lowering cholesterol via Emmi  Collaborated with appropriate clinical care team members regarding patient needs  Patient Self Care Activities related to HLD and Athersclerosis :  Patient is unable to independently self-manage chronic health conditions  Initial goal documentation         This is a list of the screening recommended for you and due dates:  Health Maintenance  Topic Date Due   DTaP/Tdap/Td vaccine (1 - Tdap) Never done   Zoster (Shingles) Vaccine (1 of 2) Never done   Pneumonia Vaccine (1 of 1 - PCV) Never done   COVID-19 Vaccine (1 - 2023-24 season) Never done   Flu Shot  12/06/2022   Medicare Annual Wellness Visit  11/22/2023   DEXA scan (bone density measurement)  Completed   Hepatitis C Screening  Completed   HPV Vaccine  Aged Out   Colon Cancer Screening  Discontinued    Advanced directives: no  Conditions/risks identified: none  Next appointment: Follow up in one year for your annual wellness visit 11/29/23 @ 9:15 am in person   Preventive Care 65 Years and Older, Female Preventive care refers to lifestyle choices and visits with your health care provider that can promote health and wellness. What does preventive care include? A yearly physical exam. This is also called an annual well check. Dental exams once or twice a year. Routine eye exams. Ask  your health care provider how often you should have your eyes checked. Personal lifestyle choices, including: Daily care of your teeth and gums. Regular physical activity. Eating a healthy diet. Avoiding tobacco and drug use. Limiting alcohol use. Practicing safe sex. Taking low-dose aspirin every day. Taking vitamin and mineral supplements as recommended by your health care provider. What happens during an annual well check? The services and screenings done by your health  care provider during your annual well check will depend on your age, overall health, lifestyle risk factors, and family history of disease. Counseling  Your health care provider may ask you questions about your: Alcohol use. Tobacco use. Drug use. Emotional well-being. Home and relationship well-being. Sexual activity. Eating habits. History of falls. Memory and ability to understand (cognition). Work and work Astronomer. Reproductive health. Screening  You may have the following tests or measurements: Height, weight, and BMI. Blood pressure. Lipid and cholesterol levels. These may be checked every 5 years, or more frequently if you are over 35 years old. Skin check. Lung cancer screening. You may have this screening every year starting at age 87 if you have a 30-pack-year history of smoking and currently smoke or have quit within the past 15 years. Fecal occult blood test (FOBT) of the stool. You may have this test every year starting at age 39. Flexible sigmoidoscopy or colonoscopy. You may have a sigmoidoscopy every 5 years or a colonoscopy every 10 years starting at age 33. Hepatitis C blood test. Hepatitis B blood test. Sexually transmitted disease (STD) testing. Diabetes screening. This is done by checking your blood sugar (glucose) after you have not eaten for a while (fasting). You may have this done every 1-3 years. Bone density scan. This is done to screen for osteoporosis. You may have this done starting at age 66. Mammogram. This may be done every 1-2 years. Talk to your health care provider about how often you should have regular mammograms. Talk with your health care provider about your test results, treatment options, and if necessary, the need for more tests. Vaccines  Your health care provider may recommend certain vaccines, such as: Influenza vaccine. This is recommended every year. Tetanus, diphtheria, and acellular pertussis (Tdap, Td) vaccine. You may need a Td  booster every 10 years. Zoster vaccine. You may need this after age 68. Pneumococcal 13-valent conjugate (PCV13) vaccine. One dose is recommended after age 31. Pneumococcal polysaccharide (PPSV23) vaccine. One dose is recommended after age 24. Talk to your health care provider about which screenings and vaccines you need and how often you need them. This information is not intended to replace advice given to you by your health care provider. Make sure you discuss any questions you have with your health care provider. Document Released: 05/20/2015 Document Revised: 01/11/2016 Document Reviewed: 02/22/2015 Elsevier Interactive Patient Education  2017 [employer].  Fall Prevention in the Home Falls can cause injuries. They can happen to people of all ages. There are many things you can do to make your home safe and to help prevent falls. What can I do on the outside of my home? Regularly fix the edges of walkways and driveways and fix any cracks. Remove anything that might make you trip as you walk through a door, such as a raised step or threshold. Trim any bushes or trees on the path to your home. Use bright outdoor lighting. Clear any walking paths of anything that might make someone trip, such as rocks or tools. Regularly check to see  if handrails are loose or broken. Make sure that both sides of any steps have handrails. Any raised decks and porches should have guardrails on the edges. Have any leaves, snow, or ice cleared regularly. Use sand or salt on walking paths during winter. Clean up any spills in your garage right away. This includes oil or grease spills. What can I do in the bathroom? Use night lights. Install grab bars by the toilet and in the tub and shower. Do not use towel bars as grab bars. Use non-skid mats or decals in the tub or shower. If you need to sit down in the shower, use a plastic, non-slip stool. Keep the floor dry. Clean up any water that spills on the floor  as soon as it happens. Remove soap buildup in the tub or shower regularly. Attach bath mats securely with double-sided non-slip rug tape. Do not have throw rugs and other things on the floor that can make you trip. What can I do in the bedroom? Use night lights. Make sure that you have a light by your bed that is easy to reach. Do not use any sheets or blankets that are too big for your bed. They should not hang down onto the floor. Have a firm chair that has side arms. You can use this for support while you get dressed. Do not have throw rugs and other things on the floor that can make you trip. What can I do in the kitchen? Clean up any spills right away. Avoid walking on wet floors. Keep items that you use a lot in easy-to-reach places. If you need to reach something above you, use a strong step stool that has a grab bar. Keep electrical cords out of the way. Do not use floor polish or wax that makes floors slippery. If you must use wax, use non-skid floor wax. Do not have throw rugs and other things on the floor that can make you trip. What can I do with my stairs? Do not leave any items on the stairs. Make sure that there are handrails on both sides of the stairs and use them. Fix handrails that are broken or loose. Make sure that handrails are as long as the stairways. Check any carpeting to make sure that it is firmly attached to the stairs. Fix any carpet that is loose or worn. Avoid having throw rugs at the top or bottom of the stairs. If you do have throw rugs, attach them to the floor with carpet tape. Make sure that you have a light switch at the top of the stairs and the bottom of the stairs. If you do not have them, ask someone to add them for you. What else can I do to help prevent falls? Wear shoes that: Do not have high heels. Have rubber bottoms. Are comfortable and fit you well. Are closed at the toe. Do not wear sandals. If you use a stepladder: Make sure that it is  fully opened. Do not climb a closed stepladder. Make sure that both sides of the stepladder are locked into place. Ask someone to hold it for you, if possible. Clearly mark and make sure that you can see: Any grab bars or handrails. First and last steps. Where the edge of each step is. Use tools that help you move around (mobility aids) if they are needed. These include: Canes. Walkers. Scooters. Crutches. Turn on the lights when you go into a dark area. Replace any light bulbs as soon  as they burn out. Set up your furniture so you have a clear path. Avoid moving your furniture around. If any of your floors are uneven, fix them. If there are any pets around you, be aware of where they are. Review your medicines with your doctor. Some medicines can make you feel dizzy. This can increase your chance of falling. Ask your doctor what other things that you can do to help prevent falls. This information is not intended to replace advice given to you by your health care provider. Make sure you discuss any questions you have with your health care provider. Document Released: 02/17/2009 Document Revised: 09/29/2015 Document Reviewed: 05/28/2014 Elsevier Interactive Patient Education  2017 [employer].

## 2022-11-26 ENCOUNTER — Other Ambulatory Visit: Payer: Self-pay | Admitting: Internal Medicine

## 2022-11-26 DIAGNOSIS — E039 Hypothyroidism, unspecified: Secondary | ICD-10-CM

## 2022-11-26 NOTE — Telephone Encounter (Signed)
Medication Refill - Medication: levothyroxine (SYNTHROID) 75 MCG tablet   Has the patient contacted their pharmacy? Yes.   (Agent: If no, request that the patient contact the pharmacy for the refill. If patient does not wish to contact the pharmacy document the reason why and proceed with request.) (Agent: If yes, when and what did the pharmacy advise?)  Preferred Pharmacy (with phone number or street name):  [id number] - [place], [place] - [address]  [address] [place] [address]  Phone: [phone] Fax: [phone]   Has the patient been seen for an appointment in the last year OR does the patient have an upcoming appointment? Yes.    Agent: Please be advised that RX refills may take up to 3 business days. We ask that you follow-up with your pharmacy.

## 2022-11-27 MED ORDER — LEVOTHYROXINE SODIUM 75 MCG PO TABS: 75.0000 ug | ORAL_TABLET | Freq: Every day | ORAL | 2 refills | Status: DC

## 2022-11-27 NOTE — Telephone Encounter (Signed)
Requested Prescriptions  Pending Prescriptions Disp Refills   levothyroxine (SYNTHROID) 75 MCG tablet 90 tablet 2    Sig: Take 1 tablet (75 mcg total) by mouth daily.     Endocrinology:  Hypothyroid Agents Passed - 11/26/2022  9:34 AM      Passed - TSH in normal range and within 360 days    TSH  Date Value Ref Range Status  11/19/2022 1.54 0.40 - 4.50 mIU/L Final         Passed - Valid encounter within last 12 months    Recent Outpatient Visits           1 week ago Encounter for general adult medical examination with abnormal findings   [place] [facility] [place], Salvadore Oxford, NP   7 months ago Adult hypothyroidism   [place] [facility] [place], Salvadore Oxford, NP   1 year ago Encounter for general adult medical examination with abnormal findings   [place] [facility] [place], Salvadore Oxford, NP   2 years ago Encounter for general adult medical examination with abnormal findings   [facility] [place], Salvadore Oxford, NP   2 years ago Hordeolum externum of right upper eyelid   [place] [facility], Jodelle Gross, FNP       Future Appointments             In 5 months Baity, Salvadore Oxford, NP [place] [facility], [place]

## 2023-01-01 ENCOUNTER — Ambulatory Visit
Admission: RE | Admit: 2023-01-01 | Discharge: 2023-01-01 | Disposition: A | Discharge status: Home / Self Care | Payer: Medicare HMO | Source: Ambulatory Visit | Attending: Internal Medicine | Admitting: Internal Medicine

## 2023-01-01 DIAGNOSIS — Z780 Asymptomatic menopausal state: Secondary | ICD-10-CM

## 2023-01-01 DIAGNOSIS — Z1231 Encounter for screening mammogram for malignant neoplasm of breast: Secondary | ICD-10-CM | POA: Diagnosis not present

## 2023-01-01 DIAGNOSIS — M85852 Other specified disorders of bone density and structure, left thigh: Secondary | ICD-10-CM | POA: Diagnosis not present

## 2023-02-14 ENCOUNTER — Other Ambulatory Visit: Payer: Self-pay | Admitting: Internal Medicine

## 2023-02-14 DIAGNOSIS — J301 Allergic rhinitis due to pollen: Secondary | ICD-10-CM

## 2023-02-14 NOTE — Telephone Encounter (Signed)
Requested by interface surescripts. Future visit in 3 months.  Requested Prescriptions  Pending Prescriptions Disp Refills   fluticasone (FLONASE) 50 MCG/ACT nasal spray [Pharmacy Med Name: FLUTICASONE PROP 50 MCG SPRAY] 48 mL 1    Sig: SPRAY 2 SPRAYS INTO EACH NOSTRIL EVERY DAY     Ear, Nose, and Throat: Nasal Preparations - Corticosteroids Passed - 02/14/2023  1:25 AM      Passed - Valid encounter within last 12 months    Recent Outpatient Visits           3 months ago Encounter for general adult medical examination with abnormal findings   [place] [facility] [place], Salvadore Oxford, NP   9 months ago Adult hypothyroidism   [place] [facility] [place], Salvadore Oxford, NP   1 year ago Encounter for general adult medical examination with abnormal findings   [place] [facility] [place], Salvadore Oxford, NP   2 years ago Encounter for general adult medical examination with abnormal findings   [place] [facility] [place], Salvadore Oxford, NP   2 years ago Hordeolum externum of right upper eyelid   [place] [facility], Jodelle Gross, FNP       Future Appointments             In 3 months Baity, Salvadore Oxford, NP [place] [facility], [facility]

## 2023-02-22 DIAGNOSIS — H6123 Impacted cerumen, bilateral: Secondary | ICD-10-CM | POA: Diagnosis not present

## 2023-02-22 DIAGNOSIS — H6063 Unspecified chronic otitis externa, bilateral: Secondary | ICD-10-CM | POA: Diagnosis not present

## 2023-02-22 DIAGNOSIS — H8103 Meniere's disease, bilateral: Secondary | ICD-10-CM | POA: Diagnosis not present

## 2023-02-22 DIAGNOSIS — H903 Sensorineural hearing loss, bilateral: Secondary | ICD-10-CM | POA: Diagnosis not present

## 2023-03-29 DIAGNOSIS — H18513 Endothelial corneal dystrophy, bilateral: Secondary | ICD-10-CM | POA: Diagnosis not present

## 2023-03-29 DIAGNOSIS — H43813 Vitreous degeneration, bilateral: Secondary | ICD-10-CM | POA: Diagnosis not present

## 2023-03-29 DIAGNOSIS — H26493 Other secondary cataract, bilateral: Secondary | ICD-10-CM | POA: Diagnosis not present

## 2023-03-29 DIAGNOSIS — H538 Other visual disturbances: Secondary | ICD-10-CM | POA: Diagnosis not present

## 2023-04-29 DIAGNOSIS — Z961 Presence of intraocular lens: Secondary | ICD-10-CM | POA: Diagnosis not present

## 2023-04-29 DIAGNOSIS — H43812 Vitreous degeneration, left eye: Secondary | ICD-10-CM | POA: Diagnosis not present

## 2023-04-29 DIAGNOSIS — H4312 Vitreous hemorrhage, left eye: Secondary | ICD-10-CM | POA: Diagnosis not present

## 2023-05-13 DIAGNOSIS — H4312 Vitreous hemorrhage, left eye: Secondary | ICD-10-CM | POA: Diagnosis not present

## 2023-05-13 DIAGNOSIS — H43812 Vitreous degeneration, left eye: Secondary | ICD-10-CM | POA: Diagnosis not present

## 2023-05-23 ENCOUNTER — Ambulatory Visit (INDEPENDENT_AMBULATORY_CARE_PROVIDER_SITE_OTHER): Payer: No Typology Code available for payment source | Admitting: Internal Medicine

## 2023-05-23 ENCOUNTER — Encounter: Payer: Self-pay | Admitting: Internal Medicine

## 2023-05-23 VITALS — BP 138/78 | Ht 66.0 in | Wt 160.4 lb

## 2023-05-23 DIAGNOSIS — Z8673 Personal history of transient ischemic attack (TIA), and cerebral infarction without residual deficits: Secondary | ICD-10-CM | POA: Diagnosis not present

## 2023-05-23 DIAGNOSIS — H8103 Meniere's disease, bilateral: Secondary | ICD-10-CM

## 2023-05-23 DIAGNOSIS — M8588 Other specified disorders of bone density and structure, other site: Secondary | ICD-10-CM

## 2023-05-23 DIAGNOSIS — K219 Gastro-esophageal reflux disease without esophagitis: Secondary | ICD-10-CM | POA: Diagnosis not present

## 2023-05-23 DIAGNOSIS — R7303 Prediabetes: Secondary | ICD-10-CM | POA: Diagnosis not present

## 2023-05-23 DIAGNOSIS — E039 Hypothyroidism, unspecified: Secondary | ICD-10-CM

## 2023-05-23 DIAGNOSIS — E785 Hyperlipidemia, unspecified: Secondary | ICD-10-CM | POA: Diagnosis not present

## 2023-05-23 DIAGNOSIS — E663 Overweight: Secondary | ICD-10-CM | POA: Diagnosis not present

## 2023-05-23 DIAGNOSIS — Z6825 Body mass index (BMI) 25.0-25.9, adult: Secondary | ICD-10-CM

## 2023-05-23 DIAGNOSIS — I6523 Occlusion and stenosis of bilateral carotid arteries: Secondary | ICD-10-CM | POA: Diagnosis not present

## 2023-05-23 MED ORDER — ASPIRIN 81 MG PO TBEC: 81.0000 mg | DELAYED_RELEASE_TABLET | Freq: Every day | ORAL | Status: DC

## 2023-05-23 NOTE — Assessment & Plan Note (Signed)
C-Met and lipid profile today Encouraged her to consume low-fat diet We will have her start baby aspirin She is agreeable to starting statin 3 times weekly

## 2023-05-23 NOTE — Patient Instructions (Signed)

## 2023-05-23 NOTE — Assessment & Plan Note (Signed)
Continue calcium and vitamin D Encourage daily weightbearing exercise 

## 2023-05-23 NOTE — Assessment & Plan Note (Signed)
Encourage weight loss as this can help reduce reflux symptoms Continue omeprazole and Tagamet

## 2023-05-23 NOTE — Assessment & Plan Note (Signed)
C-Met and lipid profile today Encouraged her consume a low-fat diet Discussed possibility of adding a statin 3 times a week, she would be agreeable to this Advised her to take aspirin OTC

## 2023-05-23 NOTE — Assessment & Plan Note (Signed)
C-Met and lipid profile today Encourage her to consume low-fat diet

## 2023-05-23 NOTE — Assessment & Plan Note (Signed)
TSH and free T4 today We will adjust levothyroxine if needed based on labs 

## 2023-05-23 NOTE — Assessment & Plan Note (Signed)
Encourage diet and exercise for weight loss 

## 2023-05-23 NOTE — Progress Notes (Signed)
Subjective:    Patient ID: Michelle Fuentes, female    DOB: [birth year], 78 y.o.   MRN: [id number]  HPI  Patient presents to clinic today for 70-month follow-up of chronic conditions.  GERD: She is not sure what triggers this.  She denies breakthrough on omeprazole and tagamet.  There is no upper GI on file.  Hypothyroidism: She denies any issues on her current dose of levothyroxine.  She does not follow with endocrinology.  Mnire's disease: Managed with prednisone and triamterene hct.  She takes valium and meclizine as needed.  She no longer follows with ENT.  HLD with carotid atherosclerosis, history of tia: Her last LDL was 169, triglycerides 148, 11/2022.  She refuses to take statins.  She is not taking baby aspirin. She tries to consume low-fat diet.  Osteopenia: She is taking calcium and vitamin d OTC.  She does not get weightbearing exercise.  Bone density from 12/2022 reviewed.  Prediabetes: Her last A1c was 5.9%, 11/2022.  She is not taking any oral diabetic medication at this time.  She does not check her sugars.  Review of Systems     Past Medical History:  Diagnosis Date   GERD (gastroesophageal reflux disease)    History of TIA (transient ischemic attack)    Hypothyroidism    Iron deficiency anemia    Meniere disease    Mixed hyperlipidemia    Osteopenia    Seasonal allergies     Current Outpatient Medications  Medication Sig Dispense Refill   bacitracin 500 UNIT/GM ointment Apply 1 application topically 2 (two) times daily. 15 g 0   bacitracin-polymyxin b (POLYSPORIN) ophthalmic ointment Place 1 application into the right eye every 12 (twelve) hours. apply to eye every 12 hours while awake (Patient not taking: Reported on 11/22/2022) 3.5 g 0   Cholecalciferol 1000 UNITS capsule Take 1,000 Units by mouth daily.      cimetidine (TAGAMET) 200 MG tablet Take 200 mg by mouth 2 (two) times daily.     diazepam (VALIUM) 2 MG tablet Take 2 mg by mouth every 6 (six) hours  as needed.      fluticasone (FLONASE) 50 MCG/ACT nasal spray SPRAY 2 SPRAYS INTO EACH NOSTRIL EVERY DAY 48 mL 1   levothyroxine (SYNTHROID) 75 MCG tablet Take 1 tablet (75 mcg total) by mouth daily. 90 tablet 2   loratadine (CLARITIN) 10 MG tablet Take 10 mg daily by mouth.     Lutein 6 MG CAPS Take 10 mg by mouth daily.      meclizine (ANTIVERT) 25 MG tablet Take 25 mg by mouth once as needed.      omeprazole (PRILOSEC) 20 MG capsule Take 20 mg by mouth daily.      predniSONE (DELTASONE) 10 MG tablet Take 10 mg by mouth once a week.      triamcinolone cream (KENALOG) 0.5 % Apply 1 application topically 2 (two) times daily. To affected areas, for up to 2 weeks. 30 g 1   triamterene-hydrochlorothiazide (DYAZIDE) 37.5-25 MG per capsule Take 1 capsule by mouth daily.      No current facility-administered medications for this visit.    Allergies  Allergen Reactions   Erythromycin Base     Other reaction(s): Other (See Comments) Made her tongue sore    Family History  Problem Relation Age of Onset   Stroke Mother    Hypertension Father    Lung cancer Father    Lymphoma Brother    Melanoma Brother  Breast cancer Maternal Grandmother 80   Ovarian cancer Neg Hx    Colon cancer Neg Hx    Diabetes Neg Hx    Heart disease Neg Hx     Social History   Socioeconomic History   Marital status: Married    Spouse name: Laurant Cdebaca   Number of children: 2   Years of education: masters   Highest education level: Master's degree (e.g., MA, MS, MEng, MEd, MSW, MBA)  Occupational History   Occupation: retired  Tobacco Use   Smoking status: Never   Smokeless tobacco: Never  Vaping Use   Vaping status: Never Used  Substance and Sexual Activity   Alcohol use: Yes    Comment: rare ( wine- maybe once a year )   Drug use: No   Sexual activity: Yes    Birth control/protection: Post-menopausal  Other Topics Concern   Not on file  Social History Narrative   Not on file   Social  Drivers of Health   Financial Resource Strain: Low Risk  (11/22/2022)   Overall Financial Resource Strain (CARDIA)    Difficulty of Paying Living Expenses: Not hard at all  Food Insecurity: No Food Insecurity (11/22/2022)   Hunger Vital Sign    Worried About Running Out of Food in the Last Year: Never true    Ran Out of Food in the Last Year: Never true  Transportation Needs: No Transportation Needs (11/22/2022)   PRAPARE - Administrator, Civil Service (Medical): No    Lack of Transportation (Non-Medical): No  Physical Activity: Inactive (11/22/2022)   Exercise Vital Sign    Days of Exercise per Week: 0 days    Minutes of Exercise per Session: 0 min  Stress: No Stress Concern Present (11/22/2022)   [employer] of Occupational Health - Occupational Stress Questionnaire    Feeling of Stress : Only a little  Social Connections: Moderately Isolated (11/22/2022)   Social Connection and Isolation Panel [NHANES]    Frequency of Communication with Friends and Family: More than three times a week    Frequency of Social Gatherings with Friends and Family: More than three times a week    Attends Religious Services: Never    Database administrator or Organizations: No    Attends Banker Meetings: Never    Marital Status: Married  Catering manager Violence: Not At Risk (11/22/2022)   Humiliation, Afraid, Rape, and Kick questionnaire    Fear of Current or Ex-Partner: No    Emotionally Abused: No    Physically Abused: No    Sexually Abused: No     Constitutional: Denies fever, malaise, fatigue, headache or abrupt weight changes.  HEENT: Denies eye pain, eye redness, ear pain, ringing in the ears, wax buildup, runny nose, nasal congestion, bloody nose, or sore throat. Respiratory: Denies difficulty breathing, shortness of breath, cough or sputum production.   Cardiovascular: Denies chest pain, chest tightness, palpitations or swelling in the hands or feet.   Gastrointestinal: Denies abdominal pain, bloating, constipation, diarrhea or blood in the stool.  GU: Denies urgency, frequency, pain with urination, burning sensation, blood in urine, odor or discharge. Musculoskeletal: Denies decrease in range of motion, difficulty with gait, muscle pain or joint pain and swelling.  Skin: Denies redness, rashes, lesions or ulcercations.  Neurological: Patient reports intermittent dizziness.  Denies difficulty with memory, difficulty with speech or problems with balance and coordination.  Psych: Denies anxiety, depression, SI/HI.  No other specific complaints in a  complete review of systems (except as listed in HPI above).   Objective:   Physical Exam   BP 138/78 (BP Location: Left Arm, Patient Position: Sitting, Cuff Size: Normal)   Ht 5\' 6"  (1.676 m)   Wt 160 lb 6.4 oz (72.8 kg)   BMI 25.89 kg/m    Wt Readings from Last 3 Encounters:  11/22/22 168 lb 9.6 oz (76.5 kg)  11/16/22 166 lb (75.3 kg)  04/27/22 161 lb (73 kg)    General: Appears her stated age, overweight, in NAD. Skin: Warm, dry and intact.  HEENT: Head: normal shape and size; Eyes: sclera white, no icterus, conjunctiva pink, PERRLA and EOMs intact;  Neck:  Neck supple, trachea midline. No masses, lumps or thyromegaly present.  Cardiovascular: Normal rate and rhythm. S1,S2 noted.  No murmur, rubs or gallops noted. No JVD or BLE edema. No carotid bruits noted. Pulmonary/Chest: Normal effort and positive vesicular breath sounds. No respiratory distress. No wheezes, rales or ronchi noted.  Abdomen: Normal bowel sounds.  Musculoskeletal:  No difficulty with gait.  Neurological: Alert and oriented. Coordination normal.    BMET    Component Value Date/Time   NA 138 11/19/2022 0915   NA 141 05/26/2015 0910   K 4.0 11/19/2022 0915   CL 100 11/19/2022 0915   CO2 28 11/19/2022 0915   GLUCOSE 100 (H) 11/19/2022 0915   BUN 18 11/19/2022 0915   BUN 19 05/26/2015 0910   CREATININE  0.75 11/19/2022 0915   CALCIUM 9.9 11/19/2022 0915   GFRNONAA 79 11/01/2020 0808   GFRAA 91 11/01/2020 0808    Lipid Panel     Component Value Date/Time   CHOL 266 (H) 11/19/2022 0915   CHOL 224 (H) 05/26/2015 0910   TRIG 148 11/19/2022 0915   HDL 68 11/19/2022 0915   HDL 75 05/26/2015 0910   CHOLHDL 3.9 11/19/2022 0915   LDLCALC 169 (H) 11/19/2022 0915    CBC    Component Value Date/Time   WBC 9.2 11/19/2022 0915   RBC 4.96 11/19/2022 0915   HGB 13.8 11/19/2022 0915   HGB 14.3 05/26/2015 0910   HCT 42.3 11/19/2022 0915   HCT 42.1 05/26/2015 0910   PLT 374 11/19/2022 0915   PLT 408 (H) 05/26/2015 0910   MCV 85.3 11/19/2022 0915   MCV 86 05/26/2015 0910   MCV 84 06/10/2014 1222   MCH 27.8 11/19/2022 0915   MCHC 32.6 11/19/2022 0915   RDW 13.0 11/19/2022 0915   RDW 13.3 05/26/2015 0910   RDW 13.2 06/10/2014 1222   LYMPHSABS 1,628 10/22/2019 0816   LYMPHSABS 1.4 05/26/2015 0910   EOSABS 248 10/22/2019 0816   EOSABS 0.3 05/26/2015 0910   BASOSABS 53 10/22/2019 0816   BASOSABS 0.0 05/26/2015 0910    Hgb A1C Lab Results  Component Value Date   HGBA1C 5.9 (H) 11/19/2022           Assessment & Plan:    RTC in 6 months for your annual exam Nicki Reaper, NP

## 2023-05-23 NOTE — Assessment & Plan Note (Signed)
Continue prednisone and triamterene HCT Continue Valium and meclizine as needed

## 2023-05-28 ENCOUNTER — Other Ambulatory Visit: Payer: Self-pay

## 2023-05-31 ENCOUNTER — Other Ambulatory Visit: Payer: Self-pay

## 2023-06-05 ENCOUNTER — Other Ambulatory Visit: Payer: Self-pay

## 2023-06-05 ENCOUNTER — Other Ambulatory Visit: Payer: No Typology Code available for payment source

## 2023-06-05 DIAGNOSIS — E039 Hypothyroidism, unspecified: Secondary | ICD-10-CM | POA: Diagnosis not present

## 2023-06-05 DIAGNOSIS — R7303 Prediabetes: Secondary | ICD-10-CM | POA: Diagnosis not present

## 2023-06-05 DIAGNOSIS — E785 Hyperlipidemia, unspecified: Secondary | ICD-10-CM

## 2023-06-06 ENCOUNTER — Encounter: Payer: Self-pay | Admitting: Internal Medicine

## 2023-06-06 LAB — CBC WITH DIFFERENTIAL/PLATELET
Absolute Lymphocytes: 1789 {cells}/uL (ref 850–3900)
Absolute Monocytes: 757 {cells}/uL (ref 200–950)
Basophils Absolute: 62 {cells}/uL (ref 0–200)
Basophils Relative: 0.7 %
Eosinophils Absolute: 303 {cells}/uL (ref 15–500)
Eosinophils Relative: 3.4 %
HCT: 42.6 % (ref 35.0–45.0)
Hemoglobin: 14 g/dL (ref 11.7–15.5)
MCH: 27.6 pg (ref 27.0–33.0)
MCHC: 32.9 g/dL (ref 32.0–36.0)
MCV: 83.9 fL (ref 80.0–100.0)
MPV: 11.6 fL (ref 7.5–12.5)
Monocytes Relative: 8.5 %
Neutro Abs: 5990 {cells}/uL (ref 1500–7800)
Neutrophils Relative %: 67.3 %
Platelets: 235 10*3/uL (ref 140–400)
RBC: 5.08 10*6/uL (ref 3.80–5.10)
RDW: 12.8 % (ref 11.0–15.0)
Total Lymphocyte: 20.1 %
WBC: 8.9 10*3/uL (ref 3.8–10.8)

## 2023-06-06 LAB — LIPID PANEL
Cholesterol: 254 mg/dL — ABNORMAL HIGH (ref <200)
HDL: 65 mg/dL (ref >=50)
LDL Cholesterol (Calc): 166 mg/dL — ABNORMAL HIGH
Non-HDL Cholesterol (Calc): 189 mg/dL — ABNORMAL HIGH (ref <130)
Total CHOL/HDL Ratio: 3.9 (calc) (ref <5.0)
Triglycerides: 111 mg/dL (ref <150)

## 2023-06-06 LAB — COMPLETE METABOLIC PANEL WITH GFR
AG Ratio: 1.9 (calc) (ref 1.0–2.5)
ALT: 10 U/L (ref 6–29)
AST: 12 U/L (ref 10–35)
Albumin: 4.4 g/dL (ref 3.6–5.1)
Alkaline phosphatase (APISO): 63 U/L (ref 37–153)
BUN: 23 mg/dL (ref 7–25)
CO2: 27 mmol/L (ref 20–32)
Calcium: 9.8 mg/dL (ref 8.6–10.4)
Chloride: 99 mmol/L (ref 98–110)
Creat: 0.74 mg/dL (ref 0.60–1.00)
Globulin: 2.3 g/dL (ref 1.9–3.7)
Glucose, Bld: 80 mg/dL (ref 65–99)
Potassium: 3.8 mmol/L (ref 3.5–5.3)
Sodium: 138 mmol/L (ref 135–146)
Total Bilirubin: 0.6 mg/dL (ref 0.2–1.2)
Total Protein: 6.7 g/dL (ref 6.1–8.1)
eGFR: 83 mL/min/{1.73_m2} (ref >=60)

## 2023-06-06 LAB — TSH: TSH: 3.04 m[IU]/L (ref 0.40–4.50)

## 2023-06-06 LAB — HEMOGLOBIN A1C
Hgb A1c MFr Bld: 5.7 %{Hb} — ABNORMAL HIGH (ref <5.7)
Mean Plasma Glucose: 117 mg/dL
eAG (mmol/L): 6.5 mmol/L

## 2023-06-06 LAB — T4, FREE: Free T4: 1.8 ng/dL (ref 0.8–1.8)

## 2023-06-17 DIAGNOSIS — M25551 Pain in right hip: Secondary | ICD-10-CM | POA: Diagnosis not present

## 2023-06-17 DIAGNOSIS — M5451 Vertebrogenic low back pain: Secondary | ICD-10-CM | POA: Diagnosis not present

## 2023-06-17 DIAGNOSIS — M25552 Pain in left hip: Secondary | ICD-10-CM | POA: Diagnosis not present

## 2023-06-17 DIAGNOSIS — M9903 Segmental and somatic dysfunction of lumbar region: Secondary | ICD-10-CM | POA: Diagnosis not present

## 2023-06-17 DIAGNOSIS — M9904 Segmental and somatic dysfunction of sacral region: Secondary | ICD-10-CM | POA: Diagnosis not present

## 2023-06-17 DIAGNOSIS — M7918 Myalgia, other site: Secondary | ICD-10-CM | POA: Diagnosis not present

## 2023-06-18 DIAGNOSIS — M9904 Segmental and somatic dysfunction of sacral region: Secondary | ICD-10-CM | POA: Diagnosis not present

## 2023-06-18 DIAGNOSIS — M9903 Segmental and somatic dysfunction of lumbar region: Secondary | ICD-10-CM | POA: Diagnosis not present

## 2023-06-18 DIAGNOSIS — M7918 Myalgia, other site: Secondary | ICD-10-CM | POA: Diagnosis not present

## 2023-06-18 DIAGNOSIS — M5451 Vertebrogenic low back pain: Secondary | ICD-10-CM | POA: Diagnosis not present

## 2023-06-18 DIAGNOSIS — M25552 Pain in left hip: Secondary | ICD-10-CM | POA: Diagnosis not present

## 2023-06-18 DIAGNOSIS — M25551 Pain in right hip: Secondary | ICD-10-CM | POA: Diagnosis not present

## 2023-06-20 DIAGNOSIS — M9903 Segmental and somatic dysfunction of lumbar region: Secondary | ICD-10-CM | POA: Diagnosis not present

## 2023-06-20 DIAGNOSIS — M7918 Myalgia, other site: Secondary | ICD-10-CM | POA: Diagnosis not present

## 2023-06-20 DIAGNOSIS — M5451 Vertebrogenic low back pain: Secondary | ICD-10-CM | POA: Diagnosis not present

## 2023-06-20 DIAGNOSIS — M25551 Pain in right hip: Secondary | ICD-10-CM | POA: Diagnosis not present

## 2023-06-20 DIAGNOSIS — M9904 Segmental and somatic dysfunction of sacral region: Secondary | ICD-10-CM | POA: Diagnosis not present

## 2023-06-20 DIAGNOSIS — M25552 Pain in left hip: Secondary | ICD-10-CM | POA: Diagnosis not present

## 2023-06-24 DIAGNOSIS — M25551 Pain in right hip: Secondary | ICD-10-CM | POA: Diagnosis not present

## 2023-06-24 DIAGNOSIS — M7918 Myalgia, other site: Secondary | ICD-10-CM | POA: Diagnosis not present

## 2023-06-24 DIAGNOSIS — Z961 Presence of intraocular lens: Secondary | ICD-10-CM | POA: Diagnosis not present

## 2023-06-24 DIAGNOSIS — M5451 Vertebrogenic low back pain: Secondary | ICD-10-CM | POA: Diagnosis not present

## 2023-06-24 DIAGNOSIS — M25552 Pain in left hip: Secondary | ICD-10-CM | POA: Diagnosis not present

## 2023-06-24 DIAGNOSIS — M9903 Segmental and somatic dysfunction of lumbar region: Secondary | ICD-10-CM | POA: Diagnosis not present

## 2023-06-24 DIAGNOSIS — M9904 Segmental and somatic dysfunction of sacral region: Secondary | ICD-10-CM | POA: Diagnosis not present

## 2023-06-24 DIAGNOSIS — H4312 Vitreous hemorrhage, left eye: Secondary | ICD-10-CM | POA: Diagnosis not present

## 2023-06-25 DIAGNOSIS — M9904 Segmental and somatic dysfunction of sacral region: Secondary | ICD-10-CM | POA: Diagnosis not present

## 2023-06-25 DIAGNOSIS — M25551 Pain in right hip: Secondary | ICD-10-CM | POA: Diagnosis not present

## 2023-06-25 DIAGNOSIS — M9903 Segmental and somatic dysfunction of lumbar region: Secondary | ICD-10-CM | POA: Diagnosis not present

## 2023-06-25 DIAGNOSIS — M25552 Pain in left hip: Secondary | ICD-10-CM | POA: Diagnosis not present

## 2023-06-25 DIAGNOSIS — M5451 Vertebrogenic low back pain: Secondary | ICD-10-CM | POA: Diagnosis not present

## 2023-06-25 DIAGNOSIS — M7918 Myalgia, other site: Secondary | ICD-10-CM | POA: Diagnosis not present

## 2023-07-01 DIAGNOSIS — M7918 Myalgia, other site: Secondary | ICD-10-CM | POA: Diagnosis not present

## 2023-07-01 DIAGNOSIS — M25551 Pain in right hip: Secondary | ICD-10-CM | POA: Diagnosis not present

## 2023-07-01 DIAGNOSIS — M5451 Vertebrogenic low back pain: Secondary | ICD-10-CM | POA: Diagnosis not present

## 2023-07-01 DIAGNOSIS — M9903 Segmental and somatic dysfunction of lumbar region: Secondary | ICD-10-CM | POA: Diagnosis not present

## 2023-07-01 DIAGNOSIS — M9904 Segmental and somatic dysfunction of sacral region: Secondary | ICD-10-CM | POA: Diagnosis not present

## 2023-07-01 DIAGNOSIS — M25552 Pain in left hip: Secondary | ICD-10-CM | POA: Diagnosis not present

## 2023-07-02 DIAGNOSIS — M25551 Pain in right hip: Secondary | ICD-10-CM | POA: Diagnosis not present

## 2023-07-02 DIAGNOSIS — M9904 Segmental and somatic dysfunction of sacral region: Secondary | ICD-10-CM | POA: Diagnosis not present

## 2023-07-02 DIAGNOSIS — M25552 Pain in left hip: Secondary | ICD-10-CM | POA: Diagnosis not present

## 2023-07-02 DIAGNOSIS — M7918 Myalgia, other site: Secondary | ICD-10-CM | POA: Diagnosis not present

## 2023-07-02 DIAGNOSIS — M5451 Vertebrogenic low back pain: Secondary | ICD-10-CM | POA: Diagnosis not present

## 2023-07-02 DIAGNOSIS — M9903 Segmental and somatic dysfunction of lumbar region: Secondary | ICD-10-CM | POA: Diagnosis not present

## 2023-07-04 DIAGNOSIS — M7918 Myalgia, other site: Secondary | ICD-10-CM | POA: Diagnosis not present

## 2023-07-04 DIAGNOSIS — M5451 Vertebrogenic low back pain: Secondary | ICD-10-CM | POA: Diagnosis not present

## 2023-07-04 DIAGNOSIS — M25551 Pain in right hip: Secondary | ICD-10-CM | POA: Diagnosis not present

## 2023-07-04 DIAGNOSIS — M9903 Segmental and somatic dysfunction of lumbar region: Secondary | ICD-10-CM | POA: Diagnosis not present

## 2023-07-04 DIAGNOSIS — M25552 Pain in left hip: Secondary | ICD-10-CM | POA: Diagnosis not present

## 2023-07-04 DIAGNOSIS — M9904 Segmental and somatic dysfunction of sacral region: Secondary | ICD-10-CM | POA: Diagnosis not present

## 2023-08-15 ENCOUNTER — Other Ambulatory Visit: Payer: Self-pay | Admitting: Internal Medicine

## 2023-08-15 DIAGNOSIS — E039 Hypothyroidism, unspecified: Secondary | ICD-10-CM

## 2023-08-15 NOTE — Telephone Encounter (Signed)
 Requested Prescriptions  Pending Prescriptions Disp Refills   levothyroxine (SYNTHROID) 75 MCG tablet [Pharmacy Med Name: LEVOTHYROXINE 75 MCG TABLET] 90 tablet 1    Sig: TAKE 1 TABLET BY MOUTH EVERY DAY     Endocrinology:  Hypothyroid Agents Failed - 08/15/2023  2:31 PM      Failed - Valid encounter within last 12 months    Recent Outpatient Visits   None     Future Appointments             In 3 months Michelle Fuentes, Michelle Oxford, NP [place] [facility]            Passed - TSH in normal range and within 360 days    TSH  Date Value Ref Range Status  06/05/2023 3.04 0.40 - 4.50 mIU/L Final

## 2023-08-16 ENCOUNTER — Other Ambulatory Visit: Payer: Self-pay | Admitting: Internal Medicine

## 2023-08-16 DIAGNOSIS — J301 Allergic rhinitis due to pollen: Secondary | ICD-10-CM

## 2023-08-16 NOTE — Telephone Encounter (Signed)
 Requested Prescriptions  Pending Prescriptions Disp Refills   fluticasone (FLONASE) 50 MCG/ACT nasal spray [Pharmacy Med Name: FLUTICASONE PROP 50 MCG SPRAY] 48 mL 1    Sig: SPRAY 2 SPRAYS INTO EACH NOSTRIL EVERY DAY     Ear, Nose, and Throat: Nasal Preparations - Corticosteroids Failed - 08/16/2023 12:57 PM      Failed - Valid encounter within last 12 months    Recent Outpatient Visits   None     Future Appointments             In 3 months Baity, Salvadore Oxford, NP [employer] [facility], [facility]

## 2023-11-22 ENCOUNTER — Ambulatory Visit (INDEPENDENT_AMBULATORY_CARE_PROVIDER_SITE_OTHER): Payer: Self-pay | Admitting: Internal Medicine

## 2023-11-22 ENCOUNTER — Encounter: Payer: Self-pay | Admitting: Internal Medicine

## 2023-11-22 VITALS — BP 138/80 | Ht 66.0 in | Wt 160.8 lb

## 2023-11-22 DIAGNOSIS — Z0001 Encounter for general adult medical examination with abnormal findings: Secondary | ICD-10-CM | POA: Diagnosis not present

## 2023-11-22 DIAGNOSIS — E039 Hypothyroidism, unspecified: Secondary | ICD-10-CM

## 2023-11-22 DIAGNOSIS — E663 Overweight: Secondary | ICD-10-CM

## 2023-11-22 DIAGNOSIS — E785 Hyperlipidemia, unspecified: Secondary | ICD-10-CM

## 2023-11-22 DIAGNOSIS — Z1231 Encounter for screening mammogram for malignant neoplasm of breast: Secondary | ICD-10-CM | POA: Diagnosis not present

## 2023-11-22 DIAGNOSIS — R739 Hyperglycemia, unspecified: Secondary | ICD-10-CM

## 2023-11-22 DIAGNOSIS — Z6825 Body mass index (BMI) 25.0-25.9, adult: Secondary | ICD-10-CM

## 2023-11-22 NOTE — Progress Notes (Signed)
 Subjective:    Patient ID: Michelle Fuentes, female    DOB: 04/16/1946, 78 y.o.   MRN: [id number]  HPI  Patient presents to clinic today for her annual exam.    Flu: 03/2019 Tetanus: >10 years ago COVID: Never Pneumovax: Never Prevnar: Never Shingrix: Never Pap smear: No longer screening Mammogram: 12/2022 Bone density: 12/2022 Colon screening: 2016, [facility] Vision screening: annually Dentist: biannually  Diet: She does eat meat. She consumes fruits and veggies. She does eat some fried foods. She drinks mostly coffee, water. Exercise: Walking   Review of Systems  Past Medical History:  Diagnosis Date   GERD (gastroesophageal reflux disease)    History of TIA (transient ischemic attack)    Hypothyroidism    Iron deficiency anemia    Meniere disease    Mixed hyperlipidemia    Osteopenia    Seasonal allergies     Current Outpatient Medications  Medication Sig Dispense Refill   aspirin  EC 81 MG tablet Take 1 tablet (81 mg total) by mouth daily. Swallow whole.     Cholecalciferol 1000 UNITS capsule Take 1,000 Units by mouth daily.      cimetidine (TAGAMET) 200 MG tablet Take 200 mg by mouth 2 (two) times daily.     diazepam (VALIUM) 2 MG tablet Take 2 mg by mouth every 6 (six) hours as needed.      fluticasone  (FLONASE ) 50 MCG/ACT nasal spray SPRAY 2 SPRAYS INTO EACH NOSTRIL EVERY DAY 48 mL 1   levothyroxine  (SYNTHROID ) 75 MCG tablet TAKE 1 TABLET BY MOUTH EVERY DAY 90 tablet 1   loratadine (CLARITIN) 10 MG tablet Take 10 mg daily by mouth.     Lutein 6 MG CAPS Take 10 mg by mouth daily.      meclizine (ANTIVERT) 25 MG tablet Take 25 mg by mouth once as needed.      mometasone (ELOCON) 0.1 % lotion PLEASE SEE ATTACHED FOR DETAILED DIRECTIONS     omeprazole (PRILOSEC) 20 MG capsule Take 20 mg by mouth daily.      predniSONE (DELTASONE) 10 MG tablet Take 10 mg by mouth once a week.      triamterene-hydrochlorothiazide (DYAZIDE) 37.5-25 MG per capsule Take 1  capsule by mouth daily.      No current facility-administered medications for this visit.    Allergies  Allergen Reactions   Erythromycin  Base     Other reaction(s): Other (See Comments) Made her tongue sore    Family History  Problem Relation Age of Onset   Stroke Mother    Hypertension Father    Lung cancer Father    Lymphoma Brother    Melanoma Brother    Breast cancer Maternal Grandmother 72   Ovarian cancer Neg Hx    Colon cancer Neg Hx    Diabetes Neg Hx    Heart disease Neg Hx     Social History   Socioeconomic History   Marital status: Married    Spouse name: Laurant Melito   Number of children: 2   Years of education: masters   Highest education level: Master's degree (e.g., MA, MS, MEng, MEd, MSW, MBA)  Occupational History   Occupation: retired  Tobacco Use   Smoking status: Never   Smokeless tobacco: Never  Vaping Use   Vaping status: Never Used  Substance and Sexual Activity   Alcohol use: Yes    Comment: rare ( wine- maybe once a year )   Drug use: No   Sexual activity:  Yes    Birth control/protection: Post-menopausal  Other Topics Concern   Not on file  Social History Narrative   Not on file   Social Drivers of Health   Financial Resource Strain: Low Risk  (11/22/2022)   Overall Financial Resource Strain (CARDIA)    Difficulty of Paying Living Expenses: Not hard at all  Food Insecurity: No Food Insecurity (11/22/2022)   Hunger Vital Sign    Worried About Running Out of Food in the Last Year: Never true    Ran Out of Food in the Last Year: Never true  Transportation Needs: No Transportation Needs (11/22/2022)   PRAPARE - Administrator, Civil Service (Medical): No    Lack of Transportation (Non-Medical): No  Physical Activity: Inactive (11/22/2022)   Exercise Vital Sign    Days of Exercise per Week: 0 days    Minutes of Exercise per Session: 0 min  Stress: No Stress Concern Present (11/22/2022)   [employer] of  Occupational Health - Occupational Stress Questionnaire    Feeling of Stress : Only a little  Social Connections: Moderately Isolated (11/22/2022)   Social Connection and Isolation Panel    Frequency of Communication with Friends and Family: More than three times a week    Frequency of Social Gatherings with Friends and Family: More than three times a week    Attends Religious Services: Never    Database administrator or Organizations: No    Attends Banker Meetings: Never    Marital Status: Married  Catering manager Violence: Not At Risk (11/22/2022)   Humiliation, Afraid, Rape, and Kick questionnaire    Fear of Current or Ex-Partner: No    Emotionally Abused: No    Physically Abused: No    Sexually Abused: No     Constitutional: Denies fever, malaise, fatigue, headache or abrupt weight changes.  HEENT: Denies eye pain, eye redness, ear pain, ringing in the ears, wax buildup, runny nose, nasal congestion, bloody nose, or sore throat. Respiratory: Denies difficulty breathing, shortness of breath, cough or sputum production.   Cardiovascular: Denies chest pain, chest tightness, palpitations or swelling in the hands or feet.  Gastrointestinal: Pt reports reflux. Denies abdominal pain, bloating, constipation, diarrhea or blood in the stool.  GU: Denies urgency, frequency, pain with urination, burning sensation, blood in urine, odor or discharge. Musculoskeletal: Pt reports intermittent joint pain. Denies decrease in range of motion, difficulty with gait, muscle pain or joint swelling.  Skin: Denies redness, rashes, lesion or ulcercations.  Neurological: Denies dizziness, difficulty with memory, difficulty with speech or problems with balance and coordination.  Psych: Denies anxiety, depression, SI/HI.  No other specific complaints in a complete review of systems (except as listed in HPI above).     Objective:   Physical Exam BP 138/80 (BP Location: Left Arm, Patient  Position: Sitting, Cuff Size: Normal)   Ht 5' 6 (1.676 m)   Wt 160 lb 12.8 oz (72.9 kg)   BMI 25.95 kg/m    Wt Readings from Last 3 Encounters:  05/23/23 160 lb 6.4 oz (72.8 kg)  11/22/22 168 lb 9.6 oz (76.5 kg)  11/16/22 166 lb (75.3 kg)    General: Appears her stated age, overweight, in NAD. Skin: Warm, dry and intact. HEENT: Head: normal shape and size; Eyes: sclera white, no icterus, conjunctiva pink, PERRLA and EOMs intact;  Neck:  Neck supple, trachea midline. No masses, lumps or thyromegaly present.  Cardiovascular: Normal rate and rhythm. S1,S2 noted.  No murmur, rubs or gallops noted. No JVD or BLE edema. No carotid bruits noted. Pulmonary/Chest: Normal effort and positive vesicular breath sounds. No respiratory distress. No wheezes, rales or ronchi noted.  Abdomen: Soft and nontender. Normal bowel sounds.  Musculoskeletal: Strength 5/ BUE/BLE. No difficulty with gait.  Neurological: Alert and oriented. Cranial nerves II-XII grossly intact. Coordination normal.  Psychiatric: Mood and affect normal. Behavior is normal. Judgment and thought content normal.    BMET    Component Value Date/Time   NA 138 06/05/2023 0927   NA 141 05/26/2015 0910   K 3.8 06/05/2023 0927   CL 99 06/05/2023 0927   CO2 27 06/05/2023 0927   GLUCOSE 80 06/05/2023 0927   BUN 23 06/05/2023 0927   BUN 19 05/26/2015 0910   CREATININE 0.74 06/05/2023 0927   CALCIUM 9.8 06/05/2023 0927   GFRNONAA 79 11/01/2020 0808   GFRAA 91 11/01/2020 0808    Lipid Panel     Component Value Date/Time   CHOL 254 (H) 06/05/2023 0927   CHOL 224 (H) 05/26/2015 0910   TRIG 111 06/05/2023 0927   HDL 65 06/05/2023 0927   HDL 75 05/26/2015 0910   CHOLHDL 3.9 06/05/2023 0927   LDLCALC 166 (H) 06/05/2023 0927    CBC    Component Value Date/Time   WBC 8.9 06/05/2023 0927   RBC 5.08 06/05/2023 0927   HGB 14.0 06/05/2023 0927   HGB 14.3 05/26/2015 0910   HCT 42.6 06/05/2023 0927   HCT 42.1 05/26/2015 0910    PLT 235 06/05/2023 0927   PLT 408 (H) 05/26/2015 0910   MCV 83.9 06/05/2023 0927   MCV 86 05/26/2015 0910   MCV 84 06/10/2014 1222   MCH 27.6 06/05/2023 0927   MCHC 32.9 06/05/2023 0927   RDW 12.8 06/05/2023 0927   RDW 13.3 05/26/2015 0910   RDW 13.2 06/10/2014 1222   LYMPHSABS 1,628 10/22/2019 0816   LYMPHSABS 1.4 05/26/2015 0910   EOSABS 303 06/05/2023 0927   EOSABS 0.3 05/26/2015 0910   BASOSABS 62 06/05/2023 0927   BASOSABS 0.0 05/26/2015 0910    Hgb A1C Lab Results  Component Value Date   HGBA1C 5.7 (H) 06/05/2023           Assessment & Plan:   Preventative health maintenance:  Encouraged her to get a flu shot in the fall She declines tetanus for financial reasons, advised if she gets bit or cut to go get this done She declines Pneumovax or Prevnar today Discussed Shingrix vaccine, she will check coverage with her insurance company and schedule visit if she would like to have this done She no longer wants to screen for cervical cancer Mammogram ordered-she will call to schedule Bone density UTD She no longer wants to screen for colon cancer given her age Encouraged her to consume a balanced diet and exercise regimen Advised her to see an eye doctor and dentist annually We will check CBC, c-Met, TSH, free T4, lipid, A1c today  RTC in 6 months, follow-up chronic conditions Angeline Laura, NP

## 2023-11-22 NOTE — Patient Instructions (Signed)
 Health Maintenance for Postmenopausal Women Menopause is a normal process in which your ability to get pregnant comes to an end. This process happens slowly over many months or years, usually between the ages of 24 and 62. Menopause is complete when you have missed your menstrual period for 12 months. It is important to talk with your health care provider about some of the most common conditions that affect women after menopause (postmenopausal women). These include heart disease, cancer, and bone loss (osteoporosis). Adopting a healthy lifestyle and getting preventive care can help to promote your health and wellness. The actions you take can also lower your chances of developing some of these common conditions. What are the signs and symptoms of menopause? During menopause, you may have the following symptoms: Hot flashes. These can be moderate or severe. Night sweats. Decrease in sex drive. Mood swings. Headaches. Tiredness (fatigue). Irritability. Memory problems. Problems falling asleep or staying asleep. Talk with your health care provider about treatment options for your symptoms. Do I need hormone replacement therapy? Hormone replacement therapy is effective in treating symptoms that are caused by menopause, such as hot flashes and night sweats. Hormone replacement carries certain risks, especially as you become older. If you are thinking about using estrogen or estrogen with progestin, discuss the benefits and risks with your health care provider. How can I reduce my risk for heart disease and stroke? The risk of heart disease, heart attack, and stroke increases as you age. One of the causes may be a change in the body's hormones during menopause. This can affect how your body uses dietary fats, triglycerides, and cholesterol. Heart attack and stroke are medical emergencies. There are many things that you can do to help prevent heart disease and stroke. Watch your blood pressure High  blood pressure causes heart disease and increases the risk of stroke. This is more likely to develop in people who have high blood pressure readings or are overweight. Have your blood pressure checked: Every 3-5 years if you are 50-75 years of age. Every year if you are 77 years old or older. Eat a healthy diet  Eat a diet that includes plenty of vegetables, fruits, low-fat dairy products, and lean protein. Do not eat a lot of foods that are high in solid fats, added sugars, or sodium. Get regular exercise Get regular exercise. This is one of the most important things you can do for your health. Most adults should: Try to exercise for at least 150 minutes each week. The exercise should increase your heart rate and make you sweat (moderate-intensity exercise). Try to do strengthening exercises at least twice each week. Do these in addition to the moderate-intensity exercise. Spend less time sitting. Even light physical activity can be beneficial. Other tips Work with your health care provider to achieve or maintain a healthy weight. Do not use any products that contain nicotine or tobacco. These products include cigarettes, chewing tobacco, and vaping devices, such as e-cigarettes. If you need help quitting, ask your health care provider. Know your numbers. Ask your health care provider to check your cholesterol and your blood sugar (glucose). Continue to have your blood tested as directed by your health care provider. Do I need screening for cancer? Depending on your health history and family history, you may need to have cancer screenings at different stages of your life. This may include screening for: Breast cancer. Cervical cancer. Lung cancer. Colorectal cancer. What is my risk for osteoporosis? After menopause, you may be  at increased risk for osteoporosis. Osteoporosis is a condition in which bone destruction happens more quickly than new bone creation. To help prevent osteoporosis or  the bone fractures that can happen because of osteoporosis, you may take the following actions: If you are 61-3 years old, get at least 1,000 mg of calcium and at least 600 international units (IU) of vitamin D per day. If you are older than age 61 but younger than age 75, get at least 1,200 mg of calcium and at least 600 international units (IU) of vitamin D per day. If you are older than age 62, get at least 1,200 mg of calcium and at least 800 international units (IU) of vitamin D per day. Smoking and drinking excessive alcohol increase the risk of osteoporosis. Eat foods that are rich in calcium and vitamin D, and do weight-bearing exercises several times each week as directed by your health care provider. How does menopause affect my mental health? Depression may occur at any age, but it is more common as you become older. Common symptoms of depression include: Feeling depressed. Changes in sleep patterns. Changes in appetite or eating patterns. Feeling an overall lack of motivation or enjoyment of activities that you previously enjoyed. Frequent crying spells. Talk with your health care provider if you think that you are experiencing any of these symptoms. General instructions See your health care provider for regular wellness exams and vaccines. This may include: Scheduling regular health, dental, and eye exams. Getting and maintaining your vaccines. These include: Influenza vaccine. Get this vaccine each year before the flu season begins. Pneumonia vaccine. Shingles vaccine. Tetanus, diphtheria, and pertussis (Tdap) booster vaccine. Your health care provider may also recommend other immunizations. Tell your health care provider if you have ever been abused or do not feel safe at home. Summary Menopause is a normal process in which your ability to get pregnant comes to an end. This condition causes hot flashes, night sweats, decreased interest in sex, mood swings, headaches, or lack  of sleep. Treatment for this condition may include hormone replacement therapy. Take actions to keep yourself healthy, including exercising regularly, eating a healthy diet, watching your weight, and checking your blood pressure and blood sugar levels. Get screened for cancer and depression. Make sure that you are up to date with all your vaccines. This information is not intended to replace advice given to you by your health care provider. Make sure you discuss any questions you have with your health care provider. Document Revised: 09/12/2020 Document Reviewed: 09/12/2020 Elsevier Patient Education  2024 [employer].

## 2023-11-22 NOTE — Assessment & Plan Note (Signed)
 Encourage diet and exercise for weight loss

## 2023-11-25 ENCOUNTER — Other Ambulatory Visit: Payer: Self-pay

## 2023-11-25 DIAGNOSIS — Z0001 Encounter for general adult medical examination with abnormal findings: Secondary | ICD-10-CM

## 2023-11-25 DIAGNOSIS — E785 Hyperlipidemia, unspecified: Secondary | ICD-10-CM

## 2023-11-25 DIAGNOSIS — R739 Hyperglycemia, unspecified: Secondary | ICD-10-CM

## 2023-11-25 DIAGNOSIS — E039 Hypothyroidism, unspecified: Secondary | ICD-10-CM

## 2023-11-25 LAB — CBC
HCT: 44.7 % (ref 35.0–45.0)
Hemoglobin: 14.1 g/dL (ref 11.7–15.5)
MCH: 27.6 pg (ref 27.0–33.0)
MCHC: 31.5 g/dL — ABNORMAL LOW (ref 32.0–36.0)
MCV: 87.5 fL (ref 80.0–100.0)
MPV: 10.9 fL (ref 7.5–12.5)
Platelets: 248 Thousand/uL (ref 140–400)
RBC: 5.11 Million/uL — ABNORMAL HIGH (ref 3.80–5.10)
RDW: 13.2 % (ref 11.0–15.0)
WBC: 8.4 Thousand/uL (ref 3.8–10.8)

## 2023-11-25 LAB — LIPID PANEL
Cholesterol: 257 mg/dL — ABNORMAL HIGH (ref <200)
HDL: 69 mg/dL (ref >=50)
LDL Cholesterol (Calc): 167 mg/dL — ABNORMAL HIGH
Non-HDL Cholesterol (Calc): 188 mg/dL — ABNORMAL HIGH (ref <130)
Total CHOL/HDL Ratio: 3.7 (calc) (ref <5.0)
Triglycerides: 99 mg/dL (ref <150)

## 2023-11-25 LAB — COMPREHENSIVE METABOLIC PANEL WITH GFR
AG Ratio: 1.9 (calc) (ref 1.0–2.5)
ALT: 10 U/L (ref 6–29)
AST: 12 U/L (ref 10–35)
Albumin: 4.5 g/dL (ref 3.6–5.1)
Alkaline phosphatase (APISO): 59 U/L (ref 37–153)
BUN: 21 mg/dL (ref 7–25)
CO2: 30 mmol/L (ref 20–32)
Calcium: 10 mg/dL (ref 8.6–10.4)
Chloride: 98 mmol/L (ref 98–110)
Creat: 0.78 mg/dL (ref 0.60–1.00)
Globulin: 2.4 g/dL (ref 1.9–3.7)
Glucose, Bld: 90 mg/dL (ref 65–99)
Potassium: 4.1 mmol/L (ref 3.5–5.3)
Sodium: 136 mmol/L (ref 135–146)
Total Bilirubin: 0.5 mg/dL (ref 0.2–1.2)
Total Protein: 6.9 g/dL (ref 6.1–8.1)
eGFR: 78 mL/min/1.73m2 (ref >=60)

## 2023-11-25 LAB — TSH: TSH: 1.33 m[IU]/L (ref 0.40–4.50)

## 2023-11-25 LAB — T4, FREE: Free T4: 1.5 ng/dL (ref 0.8–1.8)

## 2023-11-25 LAB — HEMOGLOBIN A1C
Hgb A1c MFr Bld: 5.8 % — ABNORMAL HIGH (ref <5.7)
Mean Plasma Glucose: 120 mg/dL
eAG (mmol/L): 6.6 mmol/L

## 2023-11-26 ENCOUNTER — Ambulatory Visit: Payer: Self-pay | Admitting: Internal Medicine

## 2023-11-29 ENCOUNTER — Ambulatory Visit: Payer: Medicare HMO

## 2023-11-29 DIAGNOSIS — Z0000 Encounter for general adult medical examination without abnormal findings: Secondary | ICD-10-CM | POA: Diagnosis not present

## 2023-11-29 NOTE — Progress Notes (Signed)
 Subjective:   Michelle Fuentes is a 78 y.o. who presents for a Medicare Wellness preventive visit.  As a reminder, Annual Wellness Visits don't include a physical exam, and some assessments may be limited, especially if this visit is performed virtually. We may recommend an in-person follow-up visit with your provider if needed.  Visit Complete: Virtual I connected with  Michelle Fuentes on 11/29/23 by a audio enabled telemedicine application and verified that I am speaking with the correct person using two identifiers.  Patient Location: Home  Provider Location: Home Office  I discussed the limitations of evaluation and management by telemedicine. The patient expressed understanding and agreed to proceed.  Vital Signs: Because this visit was a virtual/telehealth visit, some criteria may be missing or patient reported. Any vitals not documented were not able to be obtained and vitals that have been documented are patient reported.  VideoDeclined- This patient declined Librarian, academic. Therefore the visit was completed with audio only.  Persons Participating in Visit: Patient.  AWV Questionnaire: No: Patient Medicare AWV questionnaire was not completed prior to this visit.  Cardiac Risk Factors include: advanced age (>4men, >25 women);dyslipidemia;sedentary lifestyle     Objective:    There were no vitals filed for this visit. There is no height or weight on file to calculate BMI.     11/29/2023    8:58 AM 11/22/2022    3:09 PM 05/31/2020    1:25 PM 04/13/2020    8:26 AM 03/23/2020    9:52 AM 05/26/2019    1:49 PM 03/26/2017    9:16 AM  Advanced Directives  Does Patient Have a Medical Advance Directive? No No Yes Yes Yes Yes Yes   Type of Surveyor, minerals;Living will Healthcare Power of [place];Living will Healthcare Power of [place];Living will Living will;Healthcare Power of [employer] Power of  [place];Living will  Does patient want to make changes to medical advance directive?    No - Patient declined     Copy of Healthcare Power of Attorney in Chart?   No - copy requested No - copy requested No - copy requested No - copy requested No - copy requested   Would patient like information on creating a medical advance directive? No - Patient declined No - Patient declined          Data saved with a previous flowsheet row definition    Current Medications (verified) Outpatient Encounter Medications as of 11/29/2023  Medication Sig   aspirin  EC 81 MG tablet Take 1 tablet (81 mg total) by mouth daily. Swallow whole.   Cholecalciferol 1000 UNITS capsule Take 1,000 Units by mouth daily.    cimetidine (TAGAMET) 200 MG tablet Take 200 mg by mouth 2 (two) times daily.   diazepam (VALIUM) 2 MG tablet Take 2 mg by mouth every 6 (six) hours as needed.    fluticasone  (FLONASE ) 50 MCG/ACT nasal spray SPRAY 2 SPRAYS INTO EACH NOSTRIL EVERY DAY   levothyroxine  (SYNTHROID ) 75 MCG tablet TAKE 1 TABLET BY MOUTH EVERY DAY   loratadine (CLARITIN) 10 MG tablet Take 10 mg daily by mouth.   Lutein 6 MG CAPS Take 10 mg by mouth daily.    meclizine (ANTIVERT) 25 MG tablet Take 25 mg by mouth once as needed.    mometasone (ELOCON) 0.1 % lotion PLEASE SEE ATTACHED FOR DETAILED DIRECTIONS   omeprazole (PRILOSEC) 20 MG capsule Take 20 mg by mouth daily.    predniSONE (  DELTASONE) 10 MG tablet Take 10 mg by mouth once a week.    triamterene-hydrochlorothiazide (DYAZIDE) 37.5-25 MG per capsule Take 1 capsule by mouth daily.    No facility-administered encounter medications on file as of 11/29/2023.    Allergies (verified) Erythromycin  base   History: Past Medical History:  Diagnosis Date   GERD (gastroesophageal reflux disease)    History of TIA (transient ischemic attack)    Hypothyroidism    Iron deficiency anemia    Meniere disease    Mixed hyperlipidemia    Osteopenia    Seasonal allergies     Past Surgical History:  Procedure Laterality Date   CATARACT EXTRACTION W/PHACO Left 03/23/2020   Procedure: CATARACT EXTRACTION PHACO AND INTRAOCULAR LENS PLACEMENT (IOC) LEFT;  Surgeon: Mittie Gaskin, MD;  Location: [facility];  Service: Ophthalmology;  Laterality: Left;  6.35 1:10.9 9.0%   CATARACT EXTRACTION W/PHACO Right 04/13/2020   Procedure: CATARACT EXTRACTION PHACO AND INTRAOCULAR LENS PLACEMENT (IOC) RIGHT 9.94 01:39.7 10.0%;  Surgeon: Mittie Gaskin, MD;  Location: [facility];  Service: Ophthalmology;  Laterality: Right;   eye surgery     mole removed     TONSILLECTOMY     Family History  Problem Relation Age of Onset   Stroke Mother    Hypertension Father    Lung cancer Father    Lymphoma Brother    Melanoma Brother    Breast cancer Maternal Grandmother 80   Ovarian cancer Neg Hx    Colon cancer Neg Hx    Diabetes Neg Hx    Heart disease Neg Hx    Social History   Socioeconomic History   Marital status: Married    Spouse name: Laurant Bedingfield   Number of children: 2   Years of education: masters   Highest education level: Master's degree (e.g., MA, MS, MEng, MEd, MSW, MBA)  Occupational History   Occupation: retired  Tobacco Use   Smoking status: Never   Smokeless tobacco: Never  Vaping Use   Vaping status: Never Used  Substance and Sexual Activity   Alcohol use: Yes    Comment: rare ( wine- maybe once a year )   Drug use: No   Sexual activity: Yes    Birth control/protection: Post-menopausal  Other Topics Concern   Not on file  Social History Narrative   Not on file   Social Drivers of Health   Financial Resource Strain: Low Risk  (11/29/2023)   Overall Financial Resource Strain (CARDIA)    Difficulty of Paying Living Expenses: Not hard at all  Food Insecurity: No Food Insecurity (11/29/2023)   Hunger Vital Sign    Worried About Running Out of Food in the Last Year: Never true    Ran Out of Food in the Last  Year: Never true  Transportation Needs: No Transportation Needs (11/29/2023)   PRAPARE - Administrator, Civil Service (Medical): No    Lack of Transportation (Non-Medical): No  Physical Activity: Inactive (11/29/2023)   Exercise Vital Sign    Days of Exercise per Week: 0 days    Minutes of Exercise per Session: 0 min  Stress: No Stress Concern Present (11/29/2023)   [employer] of Occupational Health - Occupational Stress Questionnaire    Feeling of Stress: Only a little  Social Connections: Moderately Isolated (11/29/2023)   Social Connection and Isolation Panel    Frequency of Communication with Friends and Family: Three times a week    Frequency of Social Gatherings with  Friends and Family: Three times a week    Attends Religious Services: Never    Active Member of Clubs or Organizations: No    Attends Banker Meetings: Never    Marital Status: Married    Tobacco Counseling Counseling given: Not Answered    Clinical Intake:  Pre-visit preparation completed: Yes  Pain : No/denies pain     BMI - recorded: 25.95 Nutritional Status: BMI 25 -29 Overweight Nutritional Risks: None Diabetes: No  Lab Results  Component Value Date   HGBA1C 5.8 (H) 11/25/2023   HGBA1C 5.7 (H) 06/05/2023   HGBA1C 5.9 (H) 11/19/2022     How often do you need to have someone help you when you read instructions, pamphlets, or other written materials from your doctor or pharmacy?: 1 - Never  Interpreter Needed?: No  Information entered by :: Michelle DAS, LPN   Activities of Daily Living     11/29/2023    8:59 AM  In your present state of health, do you have any difficulty performing the following activities:  Hearing? 0  Vision? 0  Difficulty concentrating or making decisions? 0  Walking or climbing stairs? 0  Dressing or bathing? 0  Doing errands, shopping? 0  Preparing Food and eating ? N  Using the Toilet? N  In the past six months, have you  accidently leaked urine? N  Do you have problems with loss of bowel control? N  Managing your Medications? N  Managing your Finances? N  Housekeeping or managing your Housekeeping? N    Patient Care Team: Antonette Angeline ORN, NP as PCP - General (Internal Medicine) Garry Teddie Norleen ONEIDA, MD as Referring Physician (Otolaryngology) Milissa Hamming, MD as Referring Physician (Otolaryngology) Mittie Gaskin, MD as Referring Physician (Ophthalmology)  I have updated your Care Teams any recent Medical Services you may have received from other providers in the past year.     Assessment:   This is a routine wellness examination for Michelle Fuentes.  Hearing/Vision screen Hearing Screening - Comments:: NO AIDS Vision Screening - Comments:: READERS, HAS HAD CATARACT SGY- DR.BRASINGTON- SEEN TWICE PER YEAR   Goals Addressed             This Visit's Progress    DIET - REDUCE SUGAR INTAKE         Depression Screen     11/29/2023    8:56 AM 11/22/2023   10:21 AM 11/22/2022    3:05 PM 11/16/2022    1:56 PM 04/27/2022   11:17 AM 11/02/2021    9:17 AM 10/31/2020    3:22 PM  PHQ 2/9 Scores  PHQ - 2 Score 0 0 0 0 0 1 0  PHQ- 9 Score 0 0 0    0    Fall Risk     11/29/2023    8:59 AM 11/22/2023   10:21 AM 11/22/2022    3:10 PM 11/16/2022    1:56 PM 04/27/2022   11:17 AM  Fall Risk   Falls in the past year? 0 0 0 0 0  Number falls in past yr: 0  0    Injury with Fall? 0  0 0 0  Risk for fall due to : No Fall Risks No Fall Risks No Fall Risks No Fall Risks   Follow up Falls evaluation completed;Falls prevention discussed Falls evaluation completed Falls prevention discussed;Falls evaluation completed      MEDICARE RISK AT HOME:  Medicare Risk at Home Any stairs in or around the home?:  Yes If so, are there any without handrails?: No Home free of loose throw rugs in walkways, pet beds, electrical cords, etc?: Yes Adequate lighting in your home to reduce risk of falls?: Yes Life alert?:  No Use of a cane, walker or w/c?: No Grab bars in the bathroom?: Yes Shower chair or bench in shower?: Yes Elevated toilet seat or a handicapped toilet?: Yes  TIMED UP AND GO:  Was the test performed?  No  Cognitive Function: 6CIT completed        11/29/2023    9:01 AM 11/22/2022    3:11 PM 11/02/2021    9:29 AM 05/31/2020    1:30 PM  6CIT Screen  What Year? 0 points 0 points 0 points 0 points  What month? 0 points 0 points 0 points 0 points  What time? 0 points 0 points 0 points 0 points  Count back from 20 0 points 0 points 0 points 0 points  Months in reverse 0 points 0 points 0 points 0 points  Repeat phrase 0 points 0 points 0 points 0 points  Total Score 0 points 0 points 0 points 0 points    Immunizations Immunization History  Administered Date(s) Administered   Fluad Quad(high Dose 65+) 03/11/2019   Influenza, High Dose Seasonal PF 04/28/2015, 04/11/2017, 04/28/2018   Influenza,inj,Quad PF,6+ Mos 01/12/2013    Screening Tests Health Maintenance  Topic Date Due   COVID-19 Vaccine (1 - 2024-25 season) 12/08/2023 (Originally 01/06/2023)   Zoster Vaccines- Shingrix (1 of 2) 02/22/2024 (Originally 03/09/1996)   DTaP/Tdap/Td (1 - Tdap) 11/21/2024 (Originally 03/09/1965)   Pneumococcal Vaccine: 50+ Years (1 of 1 - PCV) 11/21/2024 (Originally 03/09/1996)   INFLUENZA VACCINE  12/06/2023   MAMMOGRAM  01/01/2024   Medicare Annual Wellness (AWV)  11/28/2024   DEXA SCAN  12/31/2024   Hepatitis C Screening  Completed   Hepatitis B Vaccines  Aged Out   HPV VACCINES  Aged Out   Meningococcal B Vaccine  Aged Out   Colonoscopy  Discontinued    Health Maintenance  There are no preventive care reminders to display for this patient. Health Maintenance Items Addressed: UP TO DATE ON BDS; NEEDS TDAP, COVID, SHINGRIX & PNA  Additional Screening:  Vision Screening: Recommended annual ophthalmology exams for early detection of glaucoma and other disorders of the eye. Would you  like a referral to an eye doctor? No    Dental Screening: Recommended annual dental exams for proper oral hygiene  Community Resource Referral / Chronic Care Management: CRR required this visit?  No   CCM required this visit?  No   Plan:    I have personally reviewed and noted the following in the patient's chart:   Medical and social history Use of alcohol, tobacco or illicit drugs  Current medications and supplements including opioid prescriptions. Patient is not currently taking opioid prescriptions. Functional ability and status Nutritional status Physical activity Advanced directives List of other physicians Hospitalizations, surgeries, and ER visits in previous 12 months Vitals Screenings to include cognitive, depression, and falls Referrals and appointments  In addition, I have reviewed and discussed with patient certain preventive protocols, quality metrics, and best practice recommendations. A written personalized care plan for preventive services as well as general preventive health recommendations were provided to patient.   Michelle GORMAN Das, LPN   [id number]   After Visit Summary: (MyChart) Due to this being a telephonic visit, the after visit summary with patients personalized plan was offered to patient via MyChart  Notes: Nothing significant to report at this time.

## 2023-11-29 NOTE — Patient Instructions (Addendum)
 Michelle Fuentes , Thank you for taking time out of your busy schedule to complete your Annual Wellness Visit with me. I enjoyed our conversation and look forward to speaking with you again next year. I, as well as your care team,  appreciate your ongoing commitment to your health goals. Please review the following plan we discussed and let me know if I can assist you in the future.    Follow up Visits: Next Medicare AWV with our clinical staff:   12/04/24 @ 9:30 AM BY PHONE Have you seen your provider in the last 6 months (3 months if uncontrolled diabetes)? Yes  Clinician Recommendations:  Aim for 30 minutes of exercise or brisk walking, 6-8 glasses of water, and 5 servings of fruits and vegetables each day. TAKE CARE!      This is a list of the screening recommended for you and due dates:  Health Maintenance  Topic Date Due   COVID-19 Vaccine (1 - 2024-25 season) 12/08/2023*   Zoster (Shingles) Vaccine (1 of 2) 02/22/2024*   DTaP/Tdap/Td vaccine (1 - Tdap) 11/21/2024*   Pneumococcal Vaccine for age over 37 (1 of 1 - PCV) 11/21/2024*   Flu Shot  12/06/2023   Mammogram  01/01/2024   Medicare Annual Wellness Visit  11/28/2024   DEXA scan (bone density measurement)  12/31/2024   Hepatitis C Screening  Completed   Hepatitis B Vaccine  Aged Out   HPV Vaccine  Aged Out   Meningitis B Vaccine  Aged Out   Colon Cancer Screening  Discontinued  *Topic was postponed. The date shown is not the original due date.    Advanced directives: (ACP Link)Information on Advanced Care Planning can be found at [place]  Secretary of [facility] Advance Health Care Directives Advance Health Care Directives. [email or web address]  Advance Care Planning is important because it:  [x]  Makes sure you receive the medical care that is consistent with your values, goals, and preferences  [x]  It provides guidance to your family and loved ones and reduces their decisional burden about whether or not they are making the right  decisions based on your wishes.  Follow the link provided in your after visit summary or read over the paperwork we have mailed to you to help you started getting your Advance Directives in place. If you need assistance in completing these, please reach out to us  so that we can help you!

## 2024-02-01 ENCOUNTER — Other Ambulatory Visit: Payer: Self-pay | Admitting: Internal Medicine

## 2024-02-01 DIAGNOSIS — E039 Hypothyroidism, unspecified: Secondary | ICD-10-CM

## 2024-02-04 NOTE — Telephone Encounter (Signed)
 Requested Prescriptions  Pending Prescriptions Disp Refills   levothyroxine  (SYNTHROID ) 75 MCG tablet [Pharmacy Med Name: LEVOTHYROXINE  75 MCG TABLET] 90 tablet 3    Sig: TAKE 1 TABLET BY MOUTH EVERY DAY     Endocrinology:  Hypothyroid Agents Passed - 02/04/2024 11:06 AM      Passed - TSH in normal range and within 360 days    TSH  Date Value Ref Range Status  11/25/2023 1.33 0.40 - 4.50 mIU/L Final         Passed - Valid encounter within last 12 months    Recent Outpatient Visits           2 months ago Encounter for general adult medical examination with abnormal findings   [place] [facility] [place], Angeline ORN, NP

## 2024-02-14 ENCOUNTER — Other Ambulatory Visit: Payer: Self-pay | Admitting: Internal Medicine

## 2024-02-14 DIAGNOSIS — J301 Allergic rhinitis due to pollen: Secondary | ICD-10-CM

## 2024-02-17 NOTE — Telephone Encounter (Signed)
 Requested Prescriptions  Pending Prescriptions Disp Refills   fluticasone  (FLONASE ) 50 MCG/ACT nasal spray [Pharmacy Med Name: FLUTICASONE  PROP 50 MCG SPRAY] 48 mL 0    Sig: SPRAY 2 SPRAYS INTO EACH NOSTRIL EVERY DAY     Ear, Nose, and Throat: Nasal Preparations - Corticosteroids Passed - 02/17/2024  2:37 PM      Passed - Valid encounter within last 12 months    Recent Outpatient Visits           2 months ago Encounter for general adult medical examination with abnormal findings   [facility] [place], Angeline ORN, NP

## 2024-04-27 ENCOUNTER — Ambulatory Visit
Admission: RE | Admit: 2024-04-27 | Discharge: 2024-04-27 | Disposition: A | Discharge status: Home / Self Care | Source: Ambulatory Visit | Attending: Internal Medicine | Admitting: Internal Medicine

## 2024-04-27 DIAGNOSIS — Z0001 Encounter for general adult medical examination with abnormal findings: Secondary | ICD-10-CM

## 2024-04-27 DIAGNOSIS — Z1231 Encounter for screening mammogram for malignant neoplasm of breast: Secondary | ICD-10-CM | POA: Diagnosis present

## 2024-05-17 ENCOUNTER — Other Ambulatory Visit: Payer: Self-pay | Admitting: Internal Medicine

## 2024-05-17 DIAGNOSIS — J301 Allergic rhinitis due to pollen: Secondary | ICD-10-CM

## 2024-05-18 NOTE — Telephone Encounter (Signed)
 Requested Prescriptions  Pending Prescriptions Disp Refills   fluticasone  (FLONASE ) 50 MCG/ACT nasal spray [Pharmacy Med Name: FLUTICASONE  PROP 50 MCG SPRAY] 48 mL 1    Sig: SPRAY 2 SPRAYS INTO EACH NOSTRIL EVERY DAY     Ear, Nose, and Throat: Nasal Preparations - Corticosteroids Passed - 05/18/2024  5:24 PM      Passed - Valid encounter within last 12 months    Recent Outpatient Visits           5 months ago Encounter for general adult medical examination with abnormal findings   [place] [facility] [place], Angeline ORN, NP

## 2024-05-21 ENCOUNTER — Ambulatory Visit: Payer: Self-pay | Admitting: Internal Medicine

## 2024-05-27 ENCOUNTER — Encounter: Payer: Self-pay | Admitting: Internal Medicine

## 2024-05-27 ENCOUNTER — Ambulatory Visit (INDEPENDENT_AMBULATORY_CARE_PROVIDER_SITE_OTHER): Payer: Self-pay | Admitting: Internal Medicine

## 2024-05-27 VITALS — BP 138/84 | Ht 66.0 in | Wt 161.2 lb

## 2024-05-27 DIAGNOSIS — E039 Hypothyroidism, unspecified: Secondary | ICD-10-CM | POA: Diagnosis not present

## 2024-05-27 DIAGNOSIS — M8588 Other specified disorders of bone density and structure, other site: Secondary | ICD-10-CM | POA: Diagnosis not present

## 2024-05-27 DIAGNOSIS — K219 Gastro-esophageal reflux disease without esophagitis: Secondary | ICD-10-CM

## 2024-05-27 DIAGNOSIS — E663 Overweight: Secondary | ICD-10-CM

## 2024-05-27 DIAGNOSIS — Z6826 Body mass index (BMI) 26.0-26.9, adult: Secondary | ICD-10-CM

## 2024-05-27 DIAGNOSIS — H8103 Meniere's disease, bilateral: Secondary | ICD-10-CM | POA: Diagnosis not present

## 2024-05-27 DIAGNOSIS — I6523 Occlusion and stenosis of bilateral carotid arteries: Secondary | ICD-10-CM | POA: Diagnosis not present

## 2024-05-27 DIAGNOSIS — Z8673 Personal history of transient ischemic attack (TIA), and cerebral infarction without residual deficits: Secondary | ICD-10-CM

## 2024-05-27 DIAGNOSIS — R7303 Prediabetes: Secondary | ICD-10-CM

## 2024-05-27 DIAGNOSIS — E785 Hyperlipidemia, unspecified: Secondary | ICD-10-CM | POA: Diagnosis not present

## 2024-05-27 MED ORDER — ASPIRIN 81 MG PO TBEC: 81.0000 mg | DELAYED_RELEASE_TABLET | Freq: Every day | ORAL | 0 refills | Status: AC

## 2024-05-27 NOTE — Assessment & Plan Note (Signed)
 TSH and free T4 today Continue levothyroxine  75 mcg daily, will adjust if needed based on labs

## 2024-05-27 NOTE — Assessment & Plan Note (Signed)
 Encourage diet and exercise for weight loss

## 2024-05-27 NOTE — Assessment & Plan Note (Signed)
C-Met and lipid profile today Encourage her to consume low-fat diet

## 2024-05-27 NOTE — Patient Instructions (Signed)

## 2024-05-27 NOTE — Assessment & Plan Note (Signed)
 C-Met and lipid profile today Encouraged her consume a low-fat diet She has declined to statin medications oin the past She will start aspirin  81 mg daily

## 2024-05-27 NOTE — Assessment & Plan Note (Signed)
 Encourage weight loss as this can help reduce reflux symptoms Continue omeprazole 20 and cimetidine 200 mg BID prn

## 2024-05-27 NOTE — Assessment & Plan Note (Addendum)
 C-Met and lipid profile today Encouraged her consume a low-fat diet She has declined to statin medications oin the past She will start aspirin  81 mg daily

## 2024-05-27 NOTE — Assessment & Plan Note (Signed)
 Continue prednisone 10 mg weeklyand triamterene HCT 37.5-25 mg daily Continue diazepam 2 mg Q6H prn and meclizine 25 mg Q8H prn

## 2024-05-27 NOTE — Assessment & Plan Note (Signed)
 Continue vitamin D Advised her to take calcium 600 mg daily Encourage daily weightbearing exercise

## 2024-05-27 NOTE — Progress Notes (Signed)
 "  Subjective:    Patient ID: Michelle Fuentes, female    DOB: 1946/02/22, 79 y.o.   MRN: [id number]  HPI  Patient presents to clinic today for 32-month follow-up of chronic conditions.  GERD: She is not sure what triggers this.  She denies breakthrough on omeprazole. If she forgets the omeprazole, she will take cimetidine. There is no upper GI on file.  Hypothyroidism: She denies any issues on her current dose of levothyroxine .  She does not follow with endocrinology.  Mnire's disease: Managed with prednisone and triamterene hct.  She takes diazepam and meclizine as needed.  She no longer follows with ENT.  HLD with carotid atherosclerosis, history of tia: Her last LDL was 167, triglycerides 99, 11/2023.  She refuses to take statins.  She is not taking baby aspirin . She tries to consume low-fat diet.  Osteopenia: She is taking vitamin d OTC but is not taking calcium.  She does not get weightbearing exercise.  Bone density from 12/2022 reviewed.  Prediabetes: Her last A1c was 5.8%, 11/2023.  She is not taking any oral diabetic medication at this time.  She does not check her sugars.  Review of Systems     Past Medical History:  Diagnosis Date   GERD (gastroesophageal reflux disease)    History of TIA (transient ischemic attack)    Hypothyroidism    Iron deficiency anemia    Meniere disease    Mixed hyperlipidemia    Osteopenia    Seasonal allergies     Current Outpatient Medications  Medication Sig Dispense Refill   aspirin  EC 81 MG tablet Take 1 tablet (81 mg total) by mouth daily. Swallow whole.     Cholecalciferol 1000 UNITS capsule Take 1,000 Units by mouth daily.      cimetidine (TAGAMET) 200 MG tablet Take 200 mg by mouth 2 (two) times daily.     diazepam (VALIUM) 2 MG tablet Take 2 mg by mouth every 6 (six) hours as needed.      fluticasone  (FLONASE ) 50 MCG/ACT nasal spray SPRAY 2 SPRAYS INTO EACH NOSTRIL EVERY DAY 48 mL 1   levothyroxine  (SYNTHROID ) 75 MCG tablet TAKE 1  TABLET BY MOUTH EVERY DAY 90 tablet 3   loratadine (CLARITIN) 10 MG tablet Take 10 mg daily by mouth.     Lutein 6 MG CAPS Take 10 mg by mouth daily.      meclizine (ANTIVERT) 25 MG tablet Take 25 mg by mouth once as needed.      mometasone (ELOCON) 0.1 % lotion PLEASE SEE ATTACHED FOR DETAILED DIRECTIONS     omeprazole (PRILOSEC) 20 MG capsule Take 20 mg by mouth daily.      predniSONE (DELTASONE) 10 MG tablet Take 10 mg by mouth once a week.      triamterene-hydrochlorothiazide (DYAZIDE) 37.5-25 MG per capsule Take 1 capsule by mouth daily.      No current facility-administered medications for this visit.    Allergies  Allergen Reactions   Erythromycin  Base     Other reaction(s): Other (See Comments) Made her tongue sore    Family History  Problem Relation Age of Onset   Stroke Mother    Hypertension Father    Lung cancer Father    Lymphoma Brother    Melanoma Brother    Breast cancer Maternal Grandmother 55   Ovarian cancer Neg Hx    Colon cancer Neg Hx    Diabetes Neg Hx    Heart disease Neg Hx  Social History   Socioeconomic History   Marital status: Married    Spouse name: Laurant Rodwell   Number of children: 2   Years of education: masters   Highest education level: Master's degree (e.g., MA, MS, MEng, MEd, MSW, MBA)  Occupational History   Occupation: retired  Tobacco Use   Smoking status: Never   Smokeless tobacco: Never  Vaping Use   Vaping status: Never Used  Substance and Sexual Activity   Alcohol use: Yes    Comment: rare ( wine- maybe once a year )   Drug use: No   Sexual activity: Yes    Birth control/protection: Post-menopausal  Other Topics Concern   Not on file  Social History Narrative   Not on file   Social Drivers of Health   Tobacco Use: Low Risk (11/29/2023)   Patient History    Smoking Tobacco Use: Never    Smokeless Tobacco Use: Never    Passive Exposure: Not on file  Financial Resource Strain: Low Risk (11/29/2023)    Overall Financial Resource Strain (CARDIA)    Difficulty of Paying Living Expenses: Not hard at all  Food Insecurity: No Food Insecurity (11/29/2023)   Epic    Worried About Radiation Protection Practitioner of Food in the Last Year: Never true    Ran Out of Food in the Last Year: Never true  Transportation Needs: No Transportation Needs (11/29/2023)   Epic    Lack of Transportation (Medical): No    Lack of Transportation (Non-Medical): No  Physical Activity: Inactive (11/29/2023)   Exercise Vital Sign    Days of Exercise per Week: 0 days    Minutes of Exercise per Session: 0 min  Stress: No Stress Concern Present (11/29/2023)   [employer] of Occupational Health - Occupational Stress Questionnaire    Feeling of Stress: Only a little  Social Connections: Moderately Isolated (11/29/2023)   Social Connection and Isolation Panel    Frequency of Communication with Friends and Family: Three times a week    Frequency of Social Gatherings with Friends and Family: Three times a week    Attends Religious Services: Never    Active Member of Clubs or Organizations: No    Attends Banker Meetings: Never    Marital Status: Married  Catering Manager Violence: Not At Risk (11/29/2023)   Epic    Fear of Current or Ex-Partner: No    Emotionally Abused: No    Physically Abused: No    Sexually Abused: No  Depression (PHQ2-9): Low Risk (11/29/2023)   Depression (PHQ2-9)    PHQ-2 Score: 0  Alcohol Screen: Low Risk (11/29/2023)   Alcohol Screen    Last Alcohol Screening Score (AUDIT): 0  Housing: Unknown (11/29/2023)   Epic    Unable to Pay for Housing in the Last Year: No    Number of Times Moved in the Last Year: Not on file    Homeless in the Last Year: No  Utilities: Not At Risk (11/29/2023)   Epic    Threatened with loss of utilities: No  Health Literacy: Adequate Health Literacy (11/29/2023)   B1300 Health Literacy    Frequency of need for help with medical instructions: Never      Constitutional: Denies fever, malaise, fatigue, headache or abrupt weight changes.  HEENT: Denies eye pain, eye redness, ear pain, ringing in the ears, wax buildup, runny nose, nasal congestion, bloody nose, or sore throat. Respiratory: Denies difficulty breathing, shortness of breath, cough or sputum production.  Cardiovascular: Denies chest pain, chest tightness, palpitations or swelling in the hands or feet.  Gastrointestinal: Patient reports intermittent reflux.  Denies abdominal pain, bloating, constipation, diarrhea or blood in the stool.  GU: Denies urgency, frequency, pain with urination, burning sensation, blood in urine, odor or discharge. Musculoskeletal: Denies decrease in range of motion, difficulty with gait, muscle pain or joint pain and swelling.  Skin: Denies redness, rashes, lesions or ulcercations.  Neurological: Patient reports intermittent dizziness.  Denies difficulty with memory, difficulty with speech or problems with balance and coordination.  Psych: Denies anxiety, depression, SI/HI.  No other specific complaints in a complete review of systems (except as listed in HPI above).   Objective:   Physical Exam  BP 138/84 (BP Location: Left Arm, Patient Position: Sitting, Cuff Size: Normal)   Ht 5' 6 (1.676 m)   Wt 161 lb 3.2 oz (73.1 kg)   BMI 26.02 kg/m     Wt Readings from Last 3 Encounters:  11/22/23 160 lb 12.8 oz (72.9 kg)  05/23/23 160 lb 6.4 oz (72.8 kg)  11/22/22 168 lb 9.6 oz (76.5 kg)    General: Appears her stated age, overweight, in NAD. Skin: Warm, dry and intact.  HEENT: Head: normal shape and size; Eyes: sclera white, no icterus, conjunctiva pink, PERRLA and EOMs intact;  Neck:  Neck supple, trachea midline. No masses, lumps or thyromegaly present.  Cardiovascular: Normal rate and rhythm. S1,S2 noted.  No murmur, rubs or gallops noted. No JVD or BLE edema. No carotid bruits noted. Pulmonary/Chest: Normal effort and positive vesicular  breath sounds. No respiratory distress. No wheezes, rales or ronchi noted.  Abdomen: Normal bowel sounds.  Musculoskeletal:  No difficulty with gait.  Neurological: Alert and oriented. Coordination normal.    BMET    Component Value Date/Time   NA 136 11/25/2023 0849   NA 141 05/26/2015 0910   K 4.1 11/25/2023 0849   CL 98 11/25/2023 0849   CO2 30 11/25/2023 0849   GLUCOSE 90 11/25/2023 0849   BUN 21 11/25/2023 0849   BUN 19 05/26/2015 0910   CREATININE 0.78 11/25/2023 0849   CALCIUM 10.0 11/25/2023 0849   GFRNONAA 79 11/01/2020 0808   GFRAA 91 11/01/2020 0808    Lipid Panel     Component Value Date/Time   CHOL 257 (H) 11/25/2023 0849   CHOL 224 (H) 05/26/2015 0910   TRIG 99 11/25/2023 0849   HDL 69 11/25/2023 0849   HDL 75 05/26/2015 0910   CHOLHDL 3.7 11/25/2023 0849   LDLCALC 167 (H) 11/25/2023 0849    CBC    Component Value Date/Time   WBC 8.4 11/25/2023 0849   RBC 5.11 (H) 11/25/2023 0849   HGB 14.1 11/25/2023 0849   HGB 14.3 05/26/2015 0910   HCT 44.7 11/25/2023 0849   HCT 42.1 05/26/2015 0910   PLT 248 11/25/2023 0849   PLT 408 (H) 05/26/2015 0910   MCV 87.5 11/25/2023 0849   MCV 86 05/26/2015 0910   MCV 84 06/10/2014 1222   MCH 27.6 11/25/2023 0849   MCHC 31.5 (L) 11/25/2023 0849   RDW 13.2 11/25/2023 0849   RDW 13.3 05/26/2015 0910   RDW 13.2 06/10/2014 1222   LYMPHSABS 1,628 10/22/2019 0816   LYMPHSABS 1.4 05/26/2015 0910   EOSABS 303 06/05/2023 0927   EOSABS 0.3 05/26/2015 0910   BASOSABS 62 06/05/2023 0927   BASOSABS 0.0 05/26/2015 0910    Hgb A1C Lab Results  Component Value Date   HGBA1C 5.8 (H) 11/25/2023  Assessment & Plan:    RTC in 6 months for your annual exam Angeline Laura, NP   "

## 2024-05-28 ENCOUNTER — Other Ambulatory Visit

## 2024-05-28 DIAGNOSIS — R7303 Prediabetes: Secondary | ICD-10-CM

## 2024-05-28 DIAGNOSIS — E785 Hyperlipidemia, unspecified: Secondary | ICD-10-CM

## 2024-05-28 DIAGNOSIS — E039 Hypothyroidism, unspecified: Secondary | ICD-10-CM

## 2024-05-29 ENCOUNTER — Ambulatory Visit: Payer: Self-pay | Admitting: Internal Medicine

## 2024-05-29 LAB — COMPREHENSIVE METABOLIC PANEL WITH GFR
AG Ratio: 2 (calc) (ref 1.0–2.5)
ALT: 12 U/L (ref 6–29)
AST: 13 U/L (ref 10–35)
Albumin: 4.7 g/dL (ref 3.6–5.1)
Alkaline phosphatase (APISO): 64 U/L (ref 37–153)
BUN: 20 mg/dL (ref 7–25)
CO2: 29 mmol/L (ref 20–32)
Calcium: 9.9 mg/dL (ref 8.6–10.4)
Chloride: 99 mmol/L (ref 98–110)
Creat: 0.62 mg/dL (ref 0.60–1.00)
Globulin: 2.4 g/dL (ref 1.9–3.7)
Glucose, Bld: 91 mg/dL (ref 65–99)
Potassium: 4.1 mmol/L (ref 3.5–5.3)
Sodium: 136 mmol/L (ref 135–146)
Total Bilirubin: 0.5 mg/dL (ref 0.2–1.2)
Total Protein: 7.1 g/dL (ref 6.1–8.1)
eGFR: 91 mL/min/1.73m2

## 2024-05-29 LAB — CBC
HCT: 42 % (ref 35.9–46.0)
Hemoglobin: 13.8 g/dL (ref 11.7–15.5)
MCH: 27.6 pg (ref 27.0–33.0)
MCHC: 32.9 g/dL (ref 31.6–35.4)
MCV: 84 fL (ref 81.4–101.7)
MPV: 11.4 fL (ref 7.5–12.5)
Platelets: 283 Thousand/uL (ref 140–400)
RBC: 5 Million/uL (ref 3.80–5.10)
RDW: 13 % (ref 11.0–15.0)
WBC: 10.3 Thousand/uL (ref 3.8–10.8)

## 2024-05-29 LAB — TSH: TSH: 2.19 m[IU]/L (ref 0.40–4.50)

## 2024-05-29 LAB — HEMOGLOBIN A1C
Hgb A1c MFr Bld: 5.7 % — ABNORMAL HIGH
Mean Plasma Glucose: 117 mg/dL
eAG (mmol/L): 6.5 mmol/L

## 2024-05-29 LAB — LIPID PANEL
Cholesterol: 237 mg/dL — ABNORMAL HIGH
HDL: 70 mg/dL
LDL Cholesterol (Calc): 146 mg/dL — ABNORMAL HIGH
Non-HDL Cholesterol (Calc): 167 mg/dL — ABNORMAL HIGH
Total CHOL/HDL Ratio: 3.4 (calc)
Triglycerides: 98 mg/dL

## 2024-05-29 LAB — T4, FREE: Free T4: 1.6 ng/dL (ref 0.8–1.8)

## 2024-12-04 ENCOUNTER — Ambulatory Visit: Payer: Self-pay

## 2024-12-09 ENCOUNTER — Ambulatory Visit: Payer: Self-pay
